# Patient Record
Sex: Female | Born: 1955 | Race: White | Hispanic: No | Smoking: Never smoker
Health system: Southern US, Community
[De-identification: ages and names within clinical notes are randomized; demographics above are authoritative.]

## PROBLEM LIST (undated history)

## (undated) DIAGNOSIS — I509 Heart failure, unspecified: Secondary | ICD-10-CM

## (undated) DIAGNOSIS — R011 Cardiac murmur, unspecified: Secondary | ICD-10-CM

## (undated) DIAGNOSIS — E782 Mixed hyperlipidemia: Secondary | ICD-10-CM

## (undated) DIAGNOSIS — E669 Obesity, unspecified: Secondary | ICD-10-CM

## (undated) DIAGNOSIS — I10 Essential (primary) hypertension: Secondary | ICD-10-CM

## (undated) HISTORY — PX: COLONOSCOPY: SHX174

## (undated) HISTORY — PX: DENTAL SURGERY: SHX609

---

## 2016-05-29 DIAGNOSIS — I10 Essential (primary) hypertension: Secondary | ICD-10-CM | POA: Insufficient documentation

## 2016-05-29 DIAGNOSIS — E782 Mixed hyperlipidemia: Secondary | ICD-10-CM | POA: Insufficient documentation

## 2016-05-29 DIAGNOSIS — E66812 Obesity, class 2: Secondary | ICD-10-CM | POA: Insufficient documentation

## 2016-05-29 DIAGNOSIS — Z6837 Body mass index (BMI) 37.0-37.9, adult: Secondary | ICD-10-CM

## 2016-05-29 HISTORY — DX: Morbid (severe) obesity due to excess calories: E66.01

## 2016-05-29 HISTORY — DX: Essential (primary) hypertension: I10

## 2016-05-29 HISTORY — DX: Obesity, class 2: E66.812

## 2016-05-29 HISTORY — DX: Body mass index (BMI) 37.0-37.9, adult: Z68.37

## 2017-06-12 DIAGNOSIS — Z0001 Encounter for general adult medical examination with abnormal findings: Secondary | ICD-10-CM

## 2017-06-12 HISTORY — DX: Encounter for general adult medical examination with abnormal findings: Z00.01

## 2019-03-01 DIAGNOSIS — N289 Disorder of kidney and ureter, unspecified: Secondary | ICD-10-CM

## 2019-03-01 HISTORY — DX: Disorder of kidney and ureter, unspecified: N28.9

## 2019-08-16 DIAGNOSIS — Z01419 Encounter for gynecological examination (general) (routine) without abnormal findings: Secondary | ICD-10-CM | POA: Insufficient documentation

## 2019-08-16 HISTORY — DX: Encounter for gynecological examination (general) (routine) without abnormal findings: Z01.419

## 2020-01-02 ENCOUNTER — Encounter (HOSPITAL_BASED_OUTPATIENT_CLINIC_OR_DEPARTMENT_OTHER): Payer: Self-pay | Admitting: Emergency Medicine

## 2020-01-02 ENCOUNTER — Emergency Department (HOSPITAL_BASED_OUTPATIENT_CLINIC_OR_DEPARTMENT_OTHER): Payer: BC Managed Care – PPO

## 2020-01-02 ENCOUNTER — Emergency Department (HOSPITAL_BASED_OUTPATIENT_CLINIC_OR_DEPARTMENT_OTHER)
Admission: EM | Admit: 2020-01-02 | Discharge: 2020-01-02 | Disposition: A | Discharge status: Home / Self Care | Payer: BC Managed Care – PPO | Attending: Emergency Medicine | Admitting: Emergency Medicine

## 2020-01-02 ENCOUNTER — Other Ambulatory Visit: Payer: Self-pay

## 2020-01-02 DIAGNOSIS — M79661 Pain in right lower leg: Secondary | ICD-10-CM

## 2020-01-02 DIAGNOSIS — Z880 Allergy status to penicillin: Secondary | ICD-10-CM | POA: Insufficient documentation

## 2020-01-02 DIAGNOSIS — Z888 Allergy status to other drugs, medicaments and biological substances status: Secondary | ICD-10-CM | POA: Diagnosis not present

## 2020-01-02 DIAGNOSIS — R2241 Localized swelling, mass and lump, right lower limb: Secondary | ICD-10-CM | POA: Diagnosis not present

## 2020-01-02 DIAGNOSIS — M79604 Pain in right leg: Secondary | ICD-10-CM | POA: Insufficient documentation

## 2020-01-02 DIAGNOSIS — M7989 Other specified soft tissue disorders: Secondary | ICD-10-CM

## 2020-01-02 HISTORY — DX: Cardiac murmur, unspecified: R01.1

## 2020-01-02 NOTE — ED Provider Notes (Signed)
[facility] EMERGENCY DEPARTMENT Provider Note   CSN: [id number] Arrival date & time: 01/02/20  1720     History Chief Complaint  Patient presents with  . Foot Pain    Latoya Rios is a 64 y.o. female.  Latoya Rios is a 64 y.o. female with a history of heart murmur, and hypertension, who presents to the emergency department for evaluation of right lower extremity swelling and pain.  Patient states symptoms have been present for 2 weeks.  He describes pain as a constant throbbing ache.  Pain seems to be worse in the ankle and radiating up.  She has had pain and swelling extending from the right foot and ankle up to the right calf.  Swelling and pain does not extend into the knee joint or thigh.  She denies any trauma or injury to the area.  States in the past she has had some mild swelling present in both legs that usually improves with elevation.  She does state that when she is elevating her leg, or not moving around more swelling seems to improve some and this does help with her discomfort.  She has noticed that it occasionally feels warm but she has not noted any redness, rash, wounds or skin changes.  She has not had any fevers or chills.  No history of gout.  No history of DVT or blood clots, but patient's daughter who is at bedside, states that she was diagnosed with factor V Leiden, and does not know much of her parents she inherited this from. Pt denies any chest pain or shortness of breath, no other joint pain or swelling.        Past Medical History:  Diagnosis Date  . Heart murmur     There are no problems to display for this patient.   History reviewed. No pertinent surgical history.   OB History   No obstetric history on file.     History reviewed. No pertinent family history.  Social History   Tobacco Use  . Smoking status: Never Smoker  Substance Use Topics  . Alcohol use: Never  . Drug use: Never    Home Medications Prior to Admission  medications   Not on File    Allergies    Clonidine, Nisoldipine, Penicillins, Quinacrine, and Quinapril  Review of Systems   Review of Systems  Constitutional: Negative for chills and fever.  Respiratory: Negative for cough and shortness of breath.   Cardiovascular: Positive for leg swelling. Negative for chest pain.  Gastrointestinal: Negative for abdominal pain, nausea and vomiting.  Musculoskeletal: Positive for arthralgias and joint swelling.  Skin: Negative for color change, rash and wound.  Neurological: Negative for weakness and numbness.  All other systems reviewed and are negative.   Physical Exam Updated Vital Signs BP (!) 212/82   Pulse 100   Temp 98.2 F (36.8 C) (Oral)   Resp 20   Ht 5\' 1"  (1.549 m)   Wt 93.4 kg   BMI 38.92 kg/m   Physical Exam Vitals and nursing note reviewed.  Constitutional:      General: She is not in acute distress.    Appearance: Normal appearance. She is well-developed. She is not ill-appearing or diaphoretic.     Comments: Well-appearing and in no distress.  HENT:     Head: Normocephalic and atraumatic.  Eyes:     General:        Right eye: No discharge.        Left eye:  No discharge.  Cardiovascular:     Rate and Rhythm: Normal rate and regular rhythm.     Pulses: Normal pulses.          Radial pulses are 2+ on the right side and 2+ on the left side.       Dorsalis pedis pulses are 2+ on the right side and 2+ on the left side.       Posterior tibial pulses are 2+ on the right side and 2+ on the left side.     Heart sounds: Normal heart sounds.  Pulmonary:     Effort: Pulmonary effort is normal. No respiratory distress.  Musculoskeletal:     Cervical back: Neck supple.     Comments: Right lower extremity with swelling extending from the foot up to the ankle and calf, edema is nonpitting, there is some warmth but no erythema, rash, wounds or skin changes noted.  Edema is not well localized over one joint.  Patient with 2+ DP  and TP pulses, equal compared to the left, trace swelling if any noted in the left lower extremity.  Tenderness to palpation over the ankle and calf without palpable deformity.  Patient is able to range the ankle with some discomfort, and has been able to bear weight.  Normal range of motion at the knee and hip.  Sensation and strength intact.  Skin:    General: Skin is warm and dry.     Findings: No rash.  Neurological:     Mental Status: She is alert and oriented to person, place, and time.     Coordination: Coordination normal.  Psychiatric:        Mood and Affect: Mood normal.        Behavior: Behavior normal.     ED Results / Procedures / Treatments   Labs (all labs ordered are listed, but only abnormal results are displayed) Labs Reviewed - No data to display  EKG None  Radiology DG Ankle Complete Right  Result Date: 01/02/2020 CLINICAL DATA:  Right foot and ankle pain and swelling for the past 2 weeks. No known injury. EXAM: RIGHT ANKLE - COMPLETE 3+ VIEW COMPARISON:  None. FINDINGS: Diffuse soft tissue swelling. No soft tissue gas, bone destruction or periosteal reaction. No fracture or effusion seen. Large inferior and small posterior calcaneal enthesophytes. IMPRESSION: Diffuse soft tissue swelling without underlying bony abnormality. Electronically Signed   By: Claudie Revering M.D.   On: 01/02/2020 18:59   US Venous Img Lower Right (DVT Study)  Result Date: 01/02/2020 CLINICAL DATA:  Right foot and ankle pain and swelling for the past 2 weeks. EXAM: RIGHT LOWER EXTREMITY VENOUS DOPPLER ULTRASOUND TECHNIQUE: Gray-scale sonography with compression, as well as color and duplex ultrasound, were performed to evaluate the deep venous system(s) from the level of the common femoral vein through the popliteal and proximal calf veins. COMPARISON:  None. FINDINGS: VENOUS Normal compressibility of the common femoral, superficial femoral, and popliteal veins, as well as the visualized calf  veins. Visualized portions of profunda femoral vein and great saphenous vein unremarkable. No filling defects to suggest DVT on grayscale or color Doppler imaging. Doppler waveforms show normal direction of venous flow, normal respiratory phasicity and response to augmentation. Limited views of the contralateral common femoral vein are unremarkable. OTHER None. Limitations: none IMPRESSION: No femoropopliteal DVT nor evidence of DVT within the visualized calf veins. If clinical symptoms are inconsistent or if there are persistent or worsening symptoms, further imaging (possibly involving the  iliac veins) may be warranted. Electronically Signed   By: Beckie Salts M.D.   On: 01/02/2020 19:00     Procedures Procedures (including critical care time)  Medications Ordered in ED Medications - No data to display  ED Course  I have reviewed the triage vital signs and the nursing notes.  Pertinent labs & imaging results that were available during my care of the patient were reviewed by me and considered in my medical decision making (see chart for details).    MDM Rules/Calculators/A&P                     64 year old female presents with right lower extremity pain and swelling.  No trauma or inciting injury.  On exam she has swelling through the foot and ankle, up to the top of the calf.  Swelling does not localize over one joint and she has range of motion of the joints.  I have low suspicion for septic arthritis or gout.  Daughter with history of factor V Leiden but patient without history of blood clots, will get DVT study to rule out, patient without any chest pain or shortness of breath.  Also get x-ray of the ankle as this seems to be where patient's pain is most severe.  She does not have bilateral swelling or other signs of fluid overload.  Patient is noted to be hypertensive, states that she stopped taking her blood pressure medication in December, because she did not like taking the pills, has been  trying to manage her blood pressure with diet modification.  I have ordered, reviewed and interpreted imaging reports.  X-ray with soft tissue swelling without underlying bony abnormality, negative DVT study.  Patient may have venous insufficiency.  We will have her begin wearing compression stockings, encourage frequent elevation, and close PCP follow-up.  Stressed the importance of blood pressure management as well, but patient wants to discuss this with her PCP.  Discussed appropriate return precautions.  Patient and daughter expressed understanding and agreement with plan.  Discharged home in good condition.  Final Clinical Impression(s) / ED Diagnoses Final diagnoses:  Pain and swelling of right lower leg    Rx / DC Orders ED Discharge Orders    None       Dartha Lodge, [place] 01/06/20 1432    Milagros Loll, MD 01/06/20 [phone]

## 2020-01-02 NOTE — Discharge Instructions (Signed)
Your work-up today is reassuring and did not show a blood clot or evidence of a fracture.  Please elevate leg as much as possible and wear compression stockings.  Follow-up closely with primary care, you can contact the primary care office here in this building, or use the phone number on your paperwork today to establish care with a PCP through the [facility] system.

## 2020-01-02 NOTE — ED Notes (Signed)
Taken to xray.

## 2020-01-02 NOTE — ED Triage Notes (Signed)
Pt c/o right lower leg and foot pain April 4th. Pt denies injury. Swelling noted.

## 2020-02-29 DIAGNOSIS — Z5320 Procedure and treatment not carried out because of patient's decision for unspecified reasons: Secondary | ICD-10-CM

## 2020-02-29 HISTORY — DX: Procedure and treatment not carried out because of patient's decision for unspecified reasons: Z53.20

## 2020-06-08 ENCOUNTER — Encounter (HOSPITAL_BASED_OUTPATIENT_CLINIC_OR_DEPARTMENT_OTHER): Payer: Self-pay | Admitting: Emergency Medicine

## 2020-06-08 ENCOUNTER — Emergency Department (HOSPITAL_BASED_OUTPATIENT_CLINIC_OR_DEPARTMENT_OTHER): Payer: BC Managed Care – PPO

## 2020-06-08 ENCOUNTER — Inpatient Hospital Stay (HOSPITAL_BASED_OUTPATIENT_CLINIC_OR_DEPARTMENT_OTHER)
Admission: EM | Admit: 2020-06-08 | Discharge: 2020-06-12 | DRG: 177 | Disposition: A | Discharge status: Home / Self Care | Payer: BC Managed Care – PPO | Attending: Internal Medicine | Admitting: Internal Medicine

## 2020-06-08 ENCOUNTER — Other Ambulatory Visit: Payer: Self-pay

## 2020-06-08 DIAGNOSIS — Z6838 Body mass index (BMI) 38.0-38.9, adult: Secondary | ICD-10-CM

## 2020-06-08 DIAGNOSIS — I2489 Other forms of acute ischemic heart disease: Secondary | ICD-10-CM

## 2020-06-08 DIAGNOSIS — I447 Left bundle-branch block, unspecified: Secondary | ICD-10-CM | POA: Diagnosis present

## 2020-06-08 DIAGNOSIS — U071 COVID-19: Principal | ICD-10-CM

## 2020-06-08 DIAGNOSIS — I160 Hypertensive urgency: Secondary | ICD-10-CM

## 2020-06-08 DIAGNOSIS — I340 Nonrheumatic mitral (valve) insufficiency: Secondary | ICD-10-CM | POA: Diagnosis not present

## 2020-06-08 DIAGNOSIS — I10 Essential (primary) hypertension: Secondary | ICD-10-CM

## 2020-06-08 DIAGNOSIS — I248 Other forms of acute ischemic heart disease: Secondary | ICD-10-CM

## 2020-06-08 DIAGNOSIS — I509 Heart failure, unspecified: Secondary | ICD-10-CM

## 2020-06-08 DIAGNOSIS — E669 Obesity, unspecified: Secondary | ICD-10-CM | POA: Diagnosis present

## 2020-06-08 DIAGNOSIS — J9601 Acute respiratory failure with hypoxia: Secondary | ICD-10-CM

## 2020-06-08 DIAGNOSIS — I110 Hypertensive heart disease with heart failure: Secondary | ICD-10-CM | POA: Diagnosis present

## 2020-06-08 DIAGNOSIS — R7989 Other specified abnormal findings of blood chemistry: Secondary | ICD-10-CM

## 2020-06-08 DIAGNOSIS — Z79899 Other long term (current) drug therapy: Secondary | ICD-10-CM

## 2020-06-08 DIAGNOSIS — J1282 Pneumonia due to coronavirus disease 2019: Secondary | ICD-10-CM | POA: Diagnosis present

## 2020-06-08 DIAGNOSIS — E782 Mixed hyperlipidemia: Secondary | ICD-10-CM | POA: Diagnosis present

## 2020-06-08 DIAGNOSIS — I5033 Acute on chronic diastolic (congestive) heart failure: Secondary | ICD-10-CM | POA: Diagnosis present

## 2020-06-08 DIAGNOSIS — R778 Other specified abnormalities of plasma proteins: Secondary | ICD-10-CM

## 2020-06-08 HISTORY — DX: Other forms of acute ischemic heart disease: I24.8

## 2020-06-08 HISTORY — DX: Heart failure, unspecified: I50.9

## 2020-06-08 HISTORY — DX: COVID-19: U07.1

## 2020-06-08 HISTORY — DX: Essential (primary) hypertension: I10

## 2020-06-08 HISTORY — DX: Obesity, unspecified: E66.9

## 2020-06-08 HISTORY — DX: Pneumonia due to coronavirus disease 2019: J12.82

## 2020-06-08 HISTORY — DX: Other forms of acute ischemic heart disease: I24.89

## 2020-06-08 HISTORY — DX: Mixed hyperlipidemia: E78.2

## 2020-06-08 HISTORY — DX: Acute respiratory failure with hypoxia: J96.01

## 2020-06-08 LAB — COMPREHENSIVE METABOLIC PANEL WITH GFR
ALT: 30 U/L (ref 0–44)
AST: 26 U/L (ref 15–41)
Albumin: 3.6 g/dL (ref 3.5–5.0)
Alkaline Phosphatase: 95 U/L (ref 38–126)
Anion gap: 12 (ref 5–15)
BUN: 17 mg/dL (ref 8–23)
CO2: 25 mmol/L (ref 22–32)
Calcium: 8.9 mg/dL (ref 8.9–10.3)
Chloride: 101 mmol/L (ref 98–111)
Creatinine, Ser: 0.71 mg/dL (ref 0.44–1.00)
GFR calc Af Amer: >60 mL/min
GFR calc non Af Amer: >60 mL/min
Glucose, Bld: 109 mg/dL — ABNORMAL HIGH (ref 70–99)
Potassium: 3.6 mmol/L (ref 3.5–5.1)
Sodium: 138 mmol/L (ref 135–145)
Total Bilirubin: 0.5 mg/dL (ref 0.3–1.2)
Total Protein: 7.5 g/dL (ref 6.5–8.1)

## 2020-06-08 LAB — FIBRINOGEN: Fibrinogen: 523 mg/dL — ABNORMAL HIGH (ref 210–475)

## 2020-06-08 LAB — TROPONIN I (HIGH SENSITIVITY)
Troponin I (High Sensitivity): 129 ng/L (ref <18)
Troponin I (High Sensitivity): 194 ng/L
Troponin I (High Sensitivity): 95 ng/L — ABNORMAL HIGH (ref <18)
Troponin I (High Sensitivity): 84 ng/L — ABNORMAL HIGH (ref <18)

## 2020-06-08 LAB — HIV ANTIBODY (ROUTINE TESTING W REFLEX): HIV Screen 4th Generation wRfx: NONREACTIVE

## 2020-06-08 LAB — LACTATE DEHYDROGENASE: LDH: 239 U/L — ABNORMAL HIGH (ref 98–192)

## 2020-06-08 LAB — CBC WITH DIFFERENTIAL/PLATELET
Abs Immature Granulocytes: 0.07 10*3/uL (ref 0.00–0.07)
Basophils Absolute: 0 10*3/uL (ref 0.0–0.1)
Basophils Relative: 0 %
Eosinophils Absolute: 0.1 10*3/uL (ref 0.0–0.5)
Eosinophils Relative: 1 %
HCT: 44.2 % (ref 36.0–46.0)
Hemoglobin: 14.5 g/dL (ref 12.0–15.0)
Immature Granulocytes: 1 %
Lymphocytes Relative: 7 %
Lymphs Abs: 0.8 10*3/uL (ref 0.7–4.0)
MCH: 28.6 pg (ref 26.0–34.0)
MCHC: 32.8 g/dL (ref 30.0–36.0)
MCV: 87.2 fL (ref 80.0–100.0)
Monocytes Absolute: 1.2 10*3/uL — ABNORMAL HIGH (ref 0.1–1.0)
Monocytes Relative: 10 %
Neutro Abs: 9.3 10*3/uL — ABNORMAL HIGH (ref 1.7–7.7)
Neutrophils Relative %: 81 %
Platelets: 300 10*3/uL (ref 150–400)
RBC: 5.07 MIL/uL (ref 3.87–5.11)
RDW: 13 % (ref 11.5–15.5)
WBC: 11.4 10*3/uL — ABNORMAL HIGH (ref 4.0–10.5)
nRBC: 0 % (ref 0.0–0.2)

## 2020-06-08 LAB — LACTIC ACID, PLASMA: Lactic Acid, Venous: 1.3 mmol/L (ref 0.5–1.9)

## 2020-06-08 LAB — C-REACTIVE PROTEIN: CRP: 2 mg/dL — ABNORMAL HIGH

## 2020-06-08 LAB — TRIGLYCERIDES: Triglycerides: 84 mg/dL (ref <150)

## 2020-06-08 LAB — SARS CORONAVIRUS 2 BY RT PCR (HOSPITAL ORDER, PERFORMED IN ~~LOC~~ HOSPITAL LAB): SARS Coronavirus 2: POSITIVE — AB

## 2020-06-08 LAB — PROCALCITONIN: Procalcitonin: <0.1 ng/mL

## 2020-06-08 LAB — HEPARIN LEVEL (UNFRACTIONATED)
Heparin Unfractionated: 0.33 [IU]/mL (ref 0.30–0.70)
Heparin Unfractionated: 0.16 IU/mL — ABNORMAL LOW (ref 0.30–0.70)

## 2020-06-08 LAB — BRAIN NATRIURETIC PEPTIDE: B Natriuretic Peptide: 297.3 pg/mL — ABNORMAL HIGH (ref 0.0–100.0)

## 2020-06-08 LAB — D-DIMER, QUANTITATIVE: D-Dimer, Quant: 1.31 ug/mL-FEU — ABNORMAL HIGH (ref 0.00–0.50)

## 2020-06-08 LAB — FERRITIN: Ferritin: 238 ng/mL (ref 11–307)

## 2020-06-08 MED ORDER — HYDROCOD POLST-CPM POLST ER 10-8 MG/5ML PO SUER
5.0000 mL | Freq: Two times a day (BID) | ORAL | Status: DC | PRN
  Filled 2020-06-08: qty 5

## 2020-06-08 MED ORDER — HEPARIN BOLUS VIA INFUSION
4000.0000 [IU] | Freq: Once | INTRAVENOUS | Status: AC
  Administered 2020-06-08: 4000 [IU] via INTRAVENOUS

## 2020-06-08 MED ORDER — HYDRALAZINE HCL 20 MG/ML IJ SOLN: 5.0000 mg | Freq: Once | INTRAMUSCULAR | Status: DC

## 2020-06-08 MED ORDER — HYDROCHLOROTHIAZIDE 25 MG PO TABS
ORAL_TABLET | ORAL | Status: AC
  Administered 2020-06-08: 25 mg
  Filled 2020-06-08: qty 1

## 2020-06-08 MED ORDER — HEPARIN (PORCINE) 25000 UT/250ML-% IV SOLN
1100.0000 [IU]/h | INTRAVENOUS | Status: DC
  Administered 2020-06-08 – 2020-06-10 (×2): 1100 [IU]/h via INTRAVENOUS
  Filled 2020-06-08 (×2): qty 250

## 2020-06-08 MED ORDER — DEXAMETHASONE 4 MG PO TABS: 6.0000 mg | ORAL_TABLET | ORAL | Status: DC

## 2020-06-08 MED ORDER — SODIUM CHLORIDE 0.9 % IV SOLN: 200.0000 mg | Freq: Once | INTRAVENOUS | Status: DC

## 2020-06-08 MED ORDER — DEXAMETHASONE 4 MG PO TABS
6.0000 mg | ORAL_TABLET | ORAL | Status: DC
  Administered 2020-06-08: 6 mg via ORAL
  Filled 2020-06-08: qty 2

## 2020-06-08 MED ORDER — LOSARTAN POTASSIUM 50 MG PO TABS
100.0000 mg | ORAL_TABLET | Freq: Every day | ORAL | Status: DC
  Administered 2020-06-09 – 2020-06-12 (×4): 100 mg via ORAL
  Filled 2020-06-08 (×4): qty 2

## 2020-06-08 MED ORDER — DEXAMETHASONE SODIUM PHOSPHATE 10 MG/ML IJ SOLN
6.0000 mg | Freq: Once | INTRAMUSCULAR | Status: AC
  Administered 2020-06-08: 6 mg via INTRAVENOUS
  Filled 2020-06-08: qty 1

## 2020-06-08 MED ORDER — ASPIRIN EC 81 MG PO TBEC
81.0000 mg | DELAYED_RELEASE_TABLET | Freq: Every day | ORAL | Status: DC
  Administered 2020-06-09 – 2020-06-12 (×4): 81 mg via ORAL
  Filled 2020-06-08 (×4): qty 1

## 2020-06-08 MED ORDER — SODIUM CHLORIDE 0.9 % IV SOLN
100.0000 mg | INTRAVENOUS | Status: AC
  Administered 2020-06-08 (×2): 100 mg via INTRAVENOUS
  Filled 2020-06-08 (×2): qty 20

## 2020-06-08 MED ORDER — HYDROCHLOROTHIAZIDE 25 MG PO TABS: 25.0000 mg | ORAL_TABLET | Freq: Every day | ORAL | Status: DC

## 2020-06-08 MED ORDER — ASCORBIC ACID 500 MG PO TABS
500.0000 mg | ORAL_TABLET | Freq: Every day | ORAL | Status: DC
  Administered 2020-06-09 – 2020-06-12 (×4): 500 mg via ORAL
  Filled 2020-06-08 (×4): qty 1

## 2020-06-08 MED ORDER — CARVEDILOL 3.125 MG PO TABS
3.1250 mg | ORAL_TABLET | Freq: Two times a day (BID) | ORAL | Status: DC
  Administered 2020-06-08 (×2): 3.125 mg via ORAL
  Filled 2020-06-08: qty 1

## 2020-06-08 MED ORDER — ZINC SULFATE 220 (50 ZN) MG PO CAPS
220.0000 mg | ORAL_CAPSULE | Freq: Every day | ORAL | Status: DC
  Administered 2020-06-09 – 2020-06-12 (×4): 220 mg via ORAL
  Filled 2020-06-08 (×4): qty 1

## 2020-06-08 MED ORDER — HYDRALAZINE HCL 20 MG/ML IJ SOLN: 2.0000 mg | Freq: Once | INTRAMUSCULAR | Status: DC

## 2020-06-08 MED ORDER — SODIUM CHLORIDE 0.9% FLUSH
3.0000 mL | Freq: Two times a day (BID) | INTRAVENOUS | Status: DC
  Administered 2020-06-09 – 2020-06-12 (×6): 3 mL via INTRAVENOUS

## 2020-06-08 MED ORDER — ACETAMINOPHEN 500 MG PO TABS
1000.0000 mg | ORAL_TABLET | Freq: Once | ORAL | Status: AC
  Administered 2020-06-08: 1000 mg via ORAL
  Filled 2020-06-08: qty 2

## 2020-06-08 MED ORDER — FUROSEMIDE 10 MG/ML IJ SOLN
40.0000 mg | Freq: Once | INTRAMUSCULAR | Status: AC
  Administered 2020-06-08: 40 mg via INTRAVENOUS
  Filled 2020-06-08: qty 4

## 2020-06-08 MED ORDER — HEPARIN BOLUS VIA INFUSION
2000.0000 [IU] | Freq: Once | INTRAVENOUS | Status: AC
  Administered 2020-06-08: 2000 [IU] via INTRAVENOUS
  Filled 2020-06-08: qty 2000

## 2020-06-08 MED ORDER — ONDANSETRON HCL 4 MG/2ML IJ SOLN
4.0000 mg | Freq: Once | INTRAMUSCULAR | Status: AC
  Administered 2020-06-08: 4 mg via INTRAVENOUS
  Filled 2020-06-08: qty 2

## 2020-06-08 MED ORDER — LOSARTAN POTASSIUM-HCTZ 100-25 MG PO TABS
1.0000 | ORAL_TABLET | Freq: Every day | ORAL | Status: DC
  Administered 2020-06-08: 1 via ORAL

## 2020-06-08 MED ORDER — LOSARTAN POTASSIUM 25 MG PO TABS
ORAL_TABLET | ORAL | Status: AC
  Administered 2020-06-08: 100 mg
  Filled 2020-06-08: qty 4

## 2020-06-08 MED ORDER — CARVEDILOL 6.25 MG PO TABS
ORAL_TABLET | ORAL | Status: AC
  Filled 2020-06-08: qty 1

## 2020-06-08 MED ORDER — SODIUM CHLORIDE 0.9 % IV SOLN: 100.0000 mg | Freq: Every day | INTRAVENOUS | Status: DC

## 2020-06-08 MED ORDER — ONDANSETRON HCL 4 MG PO TABS: 4.0000 mg | ORAL_TABLET | Freq: Four times a day (QID) | ORAL | Status: DC | PRN

## 2020-06-08 MED ORDER — ACETAMINOPHEN 325 MG PO TABS
650.0000 mg | ORAL_TABLET | Freq: Four times a day (QID) | ORAL | Status: DC | PRN
  Filled 2020-06-08: qty 2

## 2020-06-08 MED ORDER — IPRATROPIUM-ALBUTEROL 20-100 MCG/ACT IN AERS
1.0000 | INHALATION_SPRAY | Freq: Four times a day (QID) | RESPIRATORY_TRACT | Status: DC
  Administered 2020-06-08 – 2020-06-11 (×8): 1 via RESPIRATORY_TRACT
  Filled 2020-06-08: qty 4

## 2020-06-08 MED ORDER — SENNOSIDES-DOCUSATE SODIUM 8.6-50 MG PO TABS
1.0000 | ORAL_TABLET | Freq: Every evening | ORAL | Status: DC | PRN
  Administered 2020-06-09: 1 via ORAL
  Filled 2020-06-08: qty 1

## 2020-06-08 MED ORDER — HYDRALAZINE HCL 20 MG/ML IJ SOLN
INTRAMUSCULAR | Status: AC
  Administered 2020-06-08: 5 mg via INTRAVENOUS
  Filled 2020-06-08: qty 1

## 2020-06-08 MED ORDER — CARVEDILOL 6.25 MG PO TABS
6.2500 mg | ORAL_TABLET | Freq: Two times a day (BID) | ORAL | Status: DC
  Administered 2020-06-09 – 2020-06-10 (×4): 6.25 mg via ORAL
  Filled 2020-06-08 (×5): qty 1

## 2020-06-08 MED ORDER — GUAIFENESIN-DM 100-10 MG/5ML PO SYRP
10.0000 mL | ORAL_SOLUTION | ORAL | Status: DC | PRN
  Filled 2020-06-08: qty 10

## 2020-06-08 MED ORDER — HEPARIN (PORCINE) 25000 UT/250ML-% IV SOLN
900.0000 [IU]/h | INTRAVENOUS | Status: DC
  Administered 2020-06-08: 900 [IU]/h via INTRAVENOUS
  Filled 2020-06-08 (×2): qty 250

## 2020-06-08 MED ORDER — SODIUM CHLORIDE 0.9 % IV SOLN
100.0000 mg | Freq: Every day | INTRAVENOUS | Status: AC
  Administered 2020-06-09 – 2020-06-12 (×4): 100 mg via INTRAVENOUS
  Filled 2020-06-08 (×4): qty 20

## 2020-06-08 MED ORDER — ONDANSETRON HCL 4 MG/2ML IJ SOLN: 4.0000 mg | Freq: Four times a day (QID) | INTRAMUSCULAR | Status: DC | PRN

## 2020-06-08 NOTE — ED Notes (Signed)
Given [employer] for lunch

## 2020-06-08 NOTE — Progress Notes (Signed)
Placed pt on cont pulse ox, provided Incentive spiro & flutter. Educated on the use of both & return demonstration noted.

## 2020-06-08 NOTE — Progress Notes (Signed)
ANTICOAGULATION CONSULT NOTE -  Consult  Pharmacy Consult for IV heparin  Indication: Elevated troponin   Allergies  Allergen Reactions  . Clonidine   . Nisoldipine   . Penicillins   . Quinacrine   . Quinapril     Patient Measurements: Height: 5\' 1"  (154.9 cm) Weight: 93.4 kg (205 lb 14.6 oz) IBW/kg (Calculated) : 47.8  Vital Signs: Temp: 98.9 F (37.2 C) (09/23 2148) BP: 177/59 (09/23 2148) Pulse Rate: 68 (09/23 2148)  Labs: Recent Labs    06/08/20 0225 06/08/20 0225 06/08/20 0432 06/08/20 1208 06/08/20 1913 06/08/20 2019  HGB 14.5  --   --   --   --   --   HCT 44.2  --   --   --   --   --   PLT 300  --   --   --   --   --   HEPARINUNFRC  --   --   --  0.33 0.16*  --   CREATININE 0.71  --   --   --   --   --   TROPONINIHS 129*   < > 194*  --  95* 84*   < > = values in this interval not displayed.    Estimated Creatinine Clearance: 74 mL/min (by C-G formula based on SCr of 0.71 mg/dL).   Medical History: Past Medical History:  Diagnosis Date  . CHF (congestive heart failure) (HCC)   . Heart murmur   . Hypertension   . Mixed hyperlipidemia   . Obese     Assessment: 64 y/o F with shortness of breath and mildly elevated troponin. Starting heparin for now. CBC/renal function good. PTA meds reviewed, not on AC PTA.  Heparin level = 0.16, subtherapeutic. No line or  bleeding noted per RN .   Goal of Therapy:  Heparin level 0.3-0.7 units/ml Monitor platelets by anticoagulation protocol: Yes   Plan:   Heparin 2000 unit bolus  Increase heparin drip to 1100 units/hr  Repeat 6 hour HL, if therapeutic then check daily  Daily CBC  Monitor for bleeding   77, PharmD, BCPS 06/08/2020 11:14 PM

## 2020-06-08 NOTE — Progress Notes (Signed)
ANTICOAGULATION CONSULT NOTE - Initial Consult  Pharmacy Consult for Heparin  Indication: Elevated troponin   Allergies  Allergen Reactions  . Clonidine   . Nisoldipine   . Penicillins   . Quinacrine   . Quinapril     Patient Measurements: Height: 5\' 1"  (154.9 cm) Weight: 93.4 kg (205 lb 14.6 oz) IBW/kg (Calculated) : 47.8  Vital Signs: Temp: 99.6 F (37.6 C) (09/23 0431) Temp Source: Oral (09/23 0431) BP: 170/73 (09/23 0430) Pulse Rate: 84 (09/23 0431)  Labs: Recent Labs    06/08/20 0225  HGB 14.5  HCT 44.2  PLT 300  CREATININE 0.71  TROPONINIHS 129*    Estimated Creatinine Clearance: 74 mL/min (by C-G formula based on SCr of 0.71 mg/dL).   Medical History: Past Medical History:  Diagnosis Date  . Heart murmur   . Hypertension   . Mixed hyperlipidemia   . Obese     Assessment: 64 y/o F with shortness of breath and mildly elevated troponin. Starting heparin for now. CBC/renal function good. PTA meds reviewed.   Goal of Therapy:  Heparin level 0.3-0.7 units/ml Monitor platelets by anticoagulation protocol: Yes   Plan:  Heparin 4000 units BOLUS Start heparin drip at 900 units/hr 1200 HL Daily CBC/HL Monitor for bleeding  77, PharmD, BCPS Clinical Pharmacist Phone: [phone]

## 2020-06-08 NOTE — ED Notes (Signed)
Report given to Nicole RN with [facility] 

## 2020-06-08 NOTE — ED Notes (Signed)
Report given to receiving nurse Annabelle Harman RN at [facility]

## 2020-06-08 NOTE — ED Notes (Signed)
ED MD informed of BP, 205/103

## 2020-06-08 NOTE — ED Notes (Signed)
RT to bedside, assessed pt and placed on oxygen 2 L via [place].

## 2020-06-08 NOTE — Progress Notes (Signed)
ANTICOAGULATION CONSULT NOTE - Initial Consult  Pharmacy Consult for IV heparin  Indication: Elevated troponin   Allergies  Allergen Reactions  . Clonidine   . Nisoldipine   . Penicillins   . Quinacrine   . Quinapril     Patient Measurements: Height: 5\' 1"  (154.9 cm) Weight: 93.4 kg (205 lb 14.6 oz) IBW/kg (Calculated) : 47.8  Vital Signs: Temp: 98.1 F (36.7 C) (09/23 0630) Temp Source: Oral (09/23 0630) BP: 202/98 (09/23 1230) Pulse Rate: 80 (09/23 1230)  Labs: Recent Labs    06/08/20 0225 06/08/20 0432  HGB 14.5  --   HCT 44.2  --   PLT 300  --   CREATININE 0.71  --   TROPONINIHS 129* 194*    Estimated Creatinine Clearance: 74 mL/min (by C-G formula based on SCr of 0.71 mg/dL).   Medical History: Past Medical History:  Diagnosis Date  . CHF (congestive heart failure) (HCC)   . Heart murmur   . Hypertension   . Mixed hyperlipidemia   . Obese     Assessment: 64 y/o F with shortness of breath and mildly elevated troponin. Starting heparin for now. CBC/renal function good. PTA meds reviewed, not on AC PTA.  6 hour heparin level = 0.33. No s/sx bleeding charted.   Goal of Therapy:  Heparin level 0.3-0.7 units/ml Monitor platelets by anticoagulation protocol: Yes   Plan:  Continue heparin drip at 900 units/hr Repeat 6 hour HL, if therapeutic then check daily Daily CBC Monitor for bleeding  77, PharmD PGY1 Acute Care Pharmacy Resident Please refer to [facility] for unit-specific pharmacist

## 2020-06-08 NOTE — ED Notes (Signed)
EKG Done and given to Dr Tanna Savoy

## 2020-06-08 NOTE — ED Triage Notes (Signed)
Pt reports starting with cough yesterday afternoon. Pt reports feeling shob tonight. Pt oxygen sat 90% after ambulating to room.

## 2020-06-08 NOTE — H&P (Signed)
Latoya Rios is an 64 y.o. female.   Chief Complaint: Shortness of breath, cough, low grade fever, and chest tightness. HPI: Latoya Rios is a 63 yr old woman who carries a past medical history of morbid obesity and hypertension. She asserts that she "weaned herself off" of coreg as of Christmas eve of last year through a 70 lb weight loss. She states that her symptoms began on 06/06/2020. She was transferred from [facility] to [facility] for admission due to respiratory symptoms and positive COVID-19.  At [facility] Latoya Rios was found to have very high blood pressures and hypoxia with SaO2 of 92% on room air with complaints of shortness of breath. Her SaO2 increased to Latoya upper nineties with 2 liters.  Lab results demonstrated Troponin of 129 which increased to 194, LDH of 239, CRP of 2.0, Ferritin of 238, Procalcitonin <0.10. CXR demonstrated mild congestion. EKG demonstrated a left bundle branch block without previous EKG for comparison.  Latoya Rios states that she had emesis x 2 at [facility], she denies fevers, chills, diarrhea, hematemesis, hematochezia, coffee ground emesis, and melena. She denies neurological changes, chest pain or pressure, although she does admit to chest tightness.  [facility] have been consulted to admit Latoya Rios for further evaluation and treatment.  Past Medical History:  Diagnosis Date  . CHF (congestive heart failure) (HCC)   . Heart murmur   . Hypertension   . Mixed hyperlipidemia   . Obese     History reviewed. No pertinent surgical history.  No family history on file. Social History:  reports that she has never smoked. She does not have any smokeless tobacco history on file. She reports that she does not drink alcohol and does not use drugs. Medications Prior to Admission  Medication Sig Dispense Refill  . carvedilol (COREG) 3.125 MG tablet Take 3.125 mg by mouth 2 (two) times daily with a meal.    . losartan-hydrochlorothiazide (HYZAAR) 100-25 MG tablet Take 1  tablet by mouth daily.      Allergies:  Allergies  Allergen Reactions  . Clonidine   . Nisoldipine   . Penicillins   . Quinacrine   . Quinapril     Pertinent items noted in HPI and remainder of comprehensive ROS otherwise negative.   General appearance: alert, cooperative and no distress Head: Normocephalic, without obvious abnormality, atraumatic Eyes: conjunctivae/corneas clear. PERRL, EOM's intact. Fundi benign. Throat: lips, mucosa, and tongue normal; teeth and gums normal Neck: no adenopathy, no carotid bruit, no JVD, supple, symmetrical, trachea midline and thyroid not enlarged, symmetric, no tenderness/mass/nodules Resp: No increased work of breathing. No wheezes, rales, or rhonchi. No tactile fremitus Chest wall: no tenderness Cardio: regular rate and rhythm, S1, S2 normal, no murmur, click, rub or gallop GI: soft, non-tender; bowel sounds normal; no masses,  no organomegaly Extremities: +2 pitting edema of lower extremities bilaterally. Pulses: 2+ and symmetric Skin: Skin color, texture, turgor normal. No rashes or lesions Lymph nodes: Cervical, supraclavicular, and axillary nodes normal. Neurologic: Alert and oriented X 3, normal strength and tone. Normal symmetric reflexes. Normal coordination and gait   Results for orders placed or performed during Latoya hospital encounter of 06/08/20 (from Latoya past 48 hour(s))  Blood Culture (routine x 2)     Status: None (Preliminary result)   Collection Time: 06/08/20  2:11 AM   Specimen: BLOOD RIGHT ARM  Result Value Ref Range   Specimen Description      BLOOD RIGHT ARM Performed at [facility],  [address], [place], [place] [address]    Special Requests      BOTTLES DRAWN AEROBIC AND ANAEROBIC Blood Culture adequate volume Performed at [facility], [address] [place], [place] [address]    Culture      NO GROWTH < 12 HOURS Performed at [facility], [address] [place], [place]  [address]    Report Status PENDING   Blood Culture (routine x 2)     Status: None (Preliminary result)   Collection Time: 06/08/20  2:16 AM   Specimen: BLOOD LEFT ARM  Result Value Ref Range   Specimen Description      BLOOD LEFT ARM Performed at [facility] [address] [place], [place] [address]    Special Requests      BOTTLES DRAWN AEROBIC AND ANAEROBIC Blood Culture adequate volume Performed at [facility], [address] [place], [place] [address]    Culture      NO GROWTH < 12 HOURS Performed at [facility], [address], [place], [place] [address]    Report Status PENDING   CBC with Differential/Platelet     Status: Abnormal   Collection Time: 06/08/20  2:25 AM  Result Value Ref Range   WBC 11.4 (H) 4.0 - 10.5 K/uL   RBC 5.07 3.87 - 5.11 MIL/uL   Hemoglobin 14.5 12.0 - 15.0 g/dL   HCT 20.2 36 - 46 %   MCV 87.2 80.0 - 100.0 fL   MCH 28.6 26.0 - 34.0 pg   MCHC 32.8 30.0 - 36.0 g/dL   RDW 54.2 70.6 - 23.7 %   Platelets 300 150 - 400 K/uL   nRBC 0.0 0.0 - 0.2 %   Neutrophils Relative % 81 %   Neutro Abs 9.3 (H) 1.7 - 7.7 K/uL   Lymphocytes Relative 7 %   Lymphs Abs 0.8 0.7 - 4.0 K/uL   Monocytes Relative 10 %   Monocytes Absolute 1.2 (H) 0 - 1 K/uL   Eosinophils Relative 1 %   Eosinophils Absolute 0.1 0 - 0 K/uL   Basophils Relative 0 %   Basophils Absolute 0.0 0 - 0 K/uL   Immature Granulocytes 1 %   Abs Immature Granulocytes 0.07 0.00 - 0.07 K/uL    Comment: Performed at [facility], [address] [place], [place] [address]  Troponin I (High Sensitivity)     Status: Abnormal   Collection Time: 06/08/20  2:25 AM  Result Value Ref Range   Troponin I (High Sensitivity) 129 (HH) <18 ng/L    Comment: CRITICAL RESULT CALLED TO, READ BACK BY AND VERIFIED WITH: POWELL,A AT 0316 ON [id number] BY CHERESNOWSKY,T (NOTE) Elevated high sensitivity troponin I (hsTnI) values and significant  changes across serial measurements may  suggest ACS but many other  chronic and acute conditions are known to elevate hsTnI results.  Refer to Latoya Links section for chest pain algorithms and additional  guidance. Performed at [facility], [address] [place], [place] [address]   Brain natriuretic peptide     Status: Abnormal   Collection Time: 06/08/20  2:25 AM  Result Value Ref Range   B Natriuretic Peptide 297.3 (H) 0.0 - 100.0 pg/mL    Comment: Performed at [facility], [address] [place], [place] [address]  Lactic acid, plasma     Status: None   Collection  Time: 06/08/20  2:25 AM  Result Value Ref Range   Lactic Acid, Venous 1.3 0.5 - 1.9 mmol/L    Comment: Performed at [facility], [address] [place], [place] [address]  Comprehensive metabolic panel     Status: Abnormal   Collection Time: 06/08/20  2:25 AM  Result Value Ref Range   Sodium 138 135 - 145 mmol/L   Potassium 3.6 3.5 - 5.1 mmol/L   Chloride 101 98 - 111 mmol/L   CO2 25 22 - 32 mmol/L   Glucose, Bld 109 (H) 70 - 99 mg/dL    Comment: Glucose reference range applies only to samples taken after fasting for at least 8 hours.   BUN 17 8 - 23 mg/dL   Creatinine, Ser 7.51 0.44 - 1.00 mg/dL   Calcium 8.9 8.9 - 70.0 mg/dL   Total Protein 7.5 6.5 - 8.1 g/dL   Albumin 3.6 3.5 - 5.0 g/dL   AST 26 15 - 41 U/L   ALT 30 0 - 44 U/L   Alkaline Phosphatase 95 38 - 126 U/L   Total Bilirubin 0.5 0.3 - 1.2 mg/dL   GFR calc non Af Amer >60 >60 mL/min   GFR calc Af Amer >60 >60 mL/min   Anion gap 12 5 - 15    Comment: Performed at [facility], [address] [place], [place] [address]  D-dimer, quantitative     Status: Abnormal   Collection Time: 06/08/20  2:25 AM  Result Value Ref Range   D-Dimer, Quant 1.31 (H) 0.00 - 0.50 ug/mL-FEU    Comment: (NOTE) At Latoya manufacturer cut-off of 0.50 ug/mL FEU, this assay has been documented to exclude PE with a sensitivity and negative predictive value of 97 to 99%.   At this time, this assay has not been approved by [place] FDA to exclude DVT/VTE. Results should be correlated with clinical presentation. Performed at [facility], [address] [place], [place] [address]   Procalcitonin     Status: None   Collection Time: 06/08/20  2:25 AM  Result Value Ref Range   Procalcitonin <0.10 ng/mL    Comment:        Interpretation: PCT (Procalcitonin) <= 0.5 ng/mL: Systemic infection (sepsis) is not likely. Local bacterial infection is possible. (NOTE)       Sepsis PCT Algorithm           Lower Respiratory Tract                                      Infection PCT Algorithm    ----------------------------     ----------------------------         PCT < 0.25 ng/mL                PCT < 0.10 ng/mL          Strongly encourage             Strongly discourage   discontinuation of antibiotics    initiation of antibiotics    ----------------------------     -----------------------------       PCT 0.25 - 0.50 ng/mL            PCT 0.10 - 0.25 ng/mL               OR       >80% decrease in PCT  Discourage initiation of                                            antibiotics      Encourage discontinuation           of antibiotics    ----------------------------     -----------------------------         PCT >= 0.50 ng/mL              PCT 0.26 - 0.50 ng/mL               AND        <80% decrease in PCT             Encourage initiation of                                             antibiotics       Encourage continuation           of antibiotics    ----------------------------     -----------------------------        PCT >= 0.50 ng/mL                  PCT > 0.50 ng/mL               AND         increase in PCT                  Strongly encourage                                      initiation of antibiotics    Strongly encourage escalation           of antibiotics                                     -----------------------------                                            PCT <= 0.25 ng/mL                                                 OR                                        > 80% decrease in PCT                                      Discontinue / Do not initiate  antibiotics  Performed at [facility], [address] [place], [place] [address]   Lactate dehydrogenase     Status: Abnormal   Collection Time: 06/08/20  2:25 AM  Result Value Ref Range   LDH 239 (H) 98 - 192 U/L    Comment: Performed at [facility], [address] [place], [place] [address]  Ferritin     Status: None   Collection Time: 06/08/20  2:25 AM  Result Value Ref Range   Ferritin 238 11 - 307 ng/mL    Comment: Performed at [facility], [address] [place], [place] [address]  Triglycerides     Status: None   Collection Time: 06/08/20  2:25 AM  Result Value Ref Range   Triglycerides 84 <150 mg/dL    Comment: Performed at [facility], [address] [place], [place] [address]  Fibrinogen     Status: Abnormal   Collection Time: 06/08/20  2:25 AM  Result Value Ref Range   Fibrinogen 523 (H) 210 - 475 mg/dL    Comment: Performed at [facility], [address] [place], [place] [address]  C-reactive protein     Status: Abnormal   Collection Time: 06/08/20  2:25 AM  Result Value Ref Range   CRP 2.0 (H) <1.0 mg/dL    Comment: Performed at [facility], [address] [place], [place] [address]  SARS Coronavirus 2 by RT PCR (hospital order, performed in [facility] hospital lab) Nasopharyngeal Nasopharyngeal Swab     Status: Abnormal   Collection Time: 06/08/20  2:50 AM   Specimen: Nasopharyngeal Swab  Result Value Ref Range   SARS Coronavirus 2 POSITIVE (A) NEGATIVE    Comment: RESULT CALLED TO, READ BACK BY AND VERIFIED WITH: NEAL,K AT 0414 ON [id number] BY CHERESNOWSKY,T (NOTE) SARS-CoV-2 target nucleic acids are DETECTED  SARS-CoV-2 RNA is generally detectable  in upper respiratory specimens  during Latoya acute phase of infection.  Positive results are indicative  of Latoya presence of Latoya identified virus, but do not rule out bacterial infection or co-infection with other pathogens not detected by Latoya test.  Clinical correlation with Rios history and  other diagnostic information is necessary to determine Rios infection status.  Latoya expected result is negative.  Fact Sheet for Patients:   BoilerBrush.com.cyhttps://www.fda.gov/media/136312/download   Fact Sheet for Healthcare Providers:   https://pope.com/https://www.fda.gov/media/136313/download    This test is not yet approved or cleared by [place] FDA and  has been authorized for detection and/or diagnosis of SARS-CoV-2 by FDA under an Emergency Use Authorization (EUA).  This EUA will remain in effect (meaning  this test can be used) for Latoya duration of  Latoya COVID-19 declaration under Section 564(b)(1) of Latoya Act, 21 U.S.C. section 360-bbb-3(b)(1), unless Latoya authorization is terminated or revoked sooner.  Performed at [facility], [address] [place], [place] [address]   Troponin I (High Sensitivity)     Status: Abnormal   Collection Time: 06/08/20  4:32 AM  Result Value Ref Range   Troponin I (High Sensitivity) 194 (HH) <18 ng/L    Comment: CRITICAL RESULT CALLED TO, READ BACK BY AND VERIFIED WITH: NEAL,K AT 0515 ON [id number] BY CHERESNOWSKY,T  DELTA CHECK NOTED (NOTE) Elevated high sensitivity troponin I (hsTnI) values and significant  changes across serial measurements may suggest ACS but many other  chronic and acute conditions are known to elevate hsTnI results.  Refer to [facility]  Links section for chest pain algorithms and additional  guidance. Performed at [facility], [address] [place], [place] [address]   Heparin level (unfractionated)     Status: None   Collection Time: 06/08/20 12:08 PM  Result Value Ref Range   Heparin Unfractionated 0.33 0.30 - 0.70 IU/mL     Comment: (NOTE) If heparin results are below expected values, and Rios dosage has  been confirmed, suggest follow up testing of antithrombin III levels. Performed at [facility], [address] [place], [place] [address]    @[email or web address] @  Blood pressure (!) 182/71, pulse 73, temperature 98.6 F (37 C), resp. rate 19, height 5\' 1"  (1.549 m), weight 93.4 kg, SpO2 98 %.   Assessment/Plan Problem  Acute Hypoxemic Respiratory Failure Due to Covid-19 (Hcc)  Pneumonia Due to Covid-19 Virus  Accelerated Hypertension  Obesity (Bmi 30-39.9)  Demand Ischemia (Hcc)    Acute hypoxemic respiratory failure: Mild. Pt is saturating in Latoya mid to high 90's on 2L O2 by nasal canulla. Monitor. She is receiving nebulizer treatments.  Pneumonia due to COVID-19: CXR demonstrates patchy scattered interstitial infiltrates. Pt presents with shortness of breath, cough, congestion, and chest tightness. Latoya Rios will receive Remdesevir, Dexamethasone, and a heparin drip due to elevated troponins and for prophylaxis to thrombosis due to Latoya Rios's obesity with BMI greater than 38 and elevated D Dimer. Inflammatory markers will be followed closely.  Elevated troponins due to demand ischemia: Latoya Rios presented with complaints of chest tightness. Troponin initially was 129 and increased to 197. EKG was without signs of acute ischemia. It did demonstrate LBBB, and we do not have a previous EKG for comparison. Latoya Rios is on a heparin drip and has been given an aspirin. It is thought that Latoya elevated cardiac enzymes are due to increased demand due to COVID-19 and her accelerated hypertension. Her blood pressure is being addressed with increased dose of Coreg. We will recheck troponins and EKG.  Accelerated Hypertension: Systolic pressures since presentation have been 206-174 with diastolic pressures ranging from 60-98. Latoya Rios asserts that she did have hypertension, but that she weaned herself  off of Coreg with her last dose being Christmas Eve of 2020. She has lost 70+ pounds and states that is why she no longer has hypertension. She states that elevated blood pressure is due to her acute illness. I have increased Latoya dose of Coreg given her at [facility] to 6.25 from 3.125 bid.   Obesity: Increased Rios's risk of a difficult course and worsening of her respiratory issues due to COVID-19.  I have seen and examined this Rios myself. I have spent 82 minutes in her evaluation and care.  DVT Prophylaxis: Heparin Drip CODE STATUS: Full Code Family Communication: None Disposition: Latoya Rios will be admitted as inpatient to a progressive bed.  Severity of Illness: Latoya appropriate Rios status for this Rios is INPATIENT. Inpatient status is judged to be reasonable and necessary in order to provide Latoya required intensity of service to ensure Latoya Rios's safety. Latoya Rios's presenting symptoms, physical exam findings, and initial radiographic and laboratory data in Latoya context of their chronic comorbidities is felt to place them at high risk for further clinical deterioration. Furthermore, it is not anticipated that Latoya Rios will be medically stable for discharge from Latoya hospital within 2 midnights of admission. Latoya following factors support Latoya Rios status of inpatient.   " Latoya Rios's presenting symptoms include shortness of breath, cough, low  grade temp, and chest tightness. " Latoya worrisome physical exam findings include Obesity, accelerated hypertension, edema of lower extremities. " Latoya initial radiographic and laboratory data are worrisome because of elevaated troponins with trend upwards, elevated inflammatory markers, positive COVID-19 status. " Latoya chronic co-morbidities include hypertension and morbid obesity.  * I certify that at Latoya point of admission it is my clinical judgment that Latoya Rios will require inpatient hospital care spanning beyond 2 midnights from  Latoya point of admission due to high intensity of service, high risk for further deterioration and high frequency of surveillance required.*  Yomara Toothman 06/08/2020, 7:24 PM

## 2020-06-08 NOTE — ED Notes (Signed)
Second lactic discontinued per EDP 

## 2020-06-08 NOTE — ED Provider Notes (Addendum)
[facility] EMERGENCY DEPARTMENT Provider Note   CSN: [id number] Arrival date & time: 06/08/20  0143     History Chief Complaint  Patient presents with  . Shortness of Breath    Latoya Rios is a 64 y.o. female.  The history is provided by the patient.  Shortness of Breath Severity:  Severe Onset quality:  Gradual Duration:  1 day Timing:  Constant Progression:  Worsening Chronicity:  New Context: not activity   Relieved by:  Nothing Worsened by:  Nothing Ineffective treatments:  None tried Associated symptoms: cough and fever   Associated symptoms: no abdominal pain, no headaches, no rash, no sputum production, no vomiting and no wheezing   Cough:    Cough characteristics:  Non-productive   Severity:  Moderate   Onset quality:  Gradual   Timing:  Sporadic   Progression:  Unchanged   Chronicity:  New Risk factors: no hx of PE/DVT   Patient with HTN presents with 1 day of SOB, cough, congestion and low grade fever.  No leg swelling. Some chest tightness.  No loss of taste or smell, No diarrhea.  Patient is not vaccinated for covid as she has ongoing knee pain.       Past Medical History:  Diagnosis Date  . Heart murmur   . Hypertension   . Mixed hyperlipidemia   . Obese     There are no problems to display for this patient.   History reviewed. No pertinent surgical history.   OB History   No obstetric history on file.     No family history on file.  Social History   Tobacco Use  . Smoking status: Never Smoker  Substance Use Topics  . Alcohol use: Never  . Drug use: Never    Home Medications Prior to Admission medications   Medication Sig Start Date End Date Taking? Authorizing Provider  carvedilol (COREG) 3.125 MG tablet Take 3.125 mg by mouth 2 (two) times daily with a meal.   Yes [provider]  losartan-hydrochlorothiazide (HYZAAR) 100-25 MG tablet Take 1 tablet by mouth daily.   Yes [provider]     Allergies    Clonidine, Nisoldipine, Penicillins, Quinacrine, and Quinapril  Review of Systems   Review of Systems  Constitutional: Positive for fever.  HENT: Positive for congestion.   Eyes: Negative for visual disturbance.  Respiratory: Positive for cough and shortness of breath. Negative for sputum production and wheezing.   Cardiovascular: Negative for palpitations and leg swelling.  Gastrointestinal: Negative for abdominal pain and vomiting.  Genitourinary: Negative for difficulty urinating.  Musculoskeletal: Negative for arthralgias.  Skin: Negative for rash.  Neurological: Negative for headaches.  Psychiatric/Behavioral: Negative for agitation.  All other systems reviewed and are negative.   Physical Exam Updated Vital Signs BP (!) 234/110   Pulse (!) 109   Temp 99.8 F (37.7 C) (Oral)   Resp (!) 25   Ht 5\' 1"  (1.549 m)   Wt 93.4 kg   SpO2 95%   BMI 38.91 kg/m   Physical Exam Vitals and nursing note reviewed.  Constitutional:      Appearance: Normal appearance. She is not diaphoretic.  HENT:     Head: Normocephalic and atraumatic.     Nose: Nose normal.  Eyes:     Conjunctiva/sclera: Conjunctivae normal.     Pupils: Pupils are equal, round, and reactive to light.  Cardiovascular:     Rate and Rhythm: Normal rate and regular rhythm.  Pulses: Normal pulses.     Heart sounds: Normal heart sounds.  Pulmonary:     Effort: Pulmonary effort is normal.     Breath sounds: Normal breath sounds. No wheezing or rales.  Abdominal:     General: Abdomen is flat. Bowel sounds are normal.     Palpations: Abdomen is soft.     Tenderness: There is no abdominal tenderness.  Musculoskeletal:        General: Normal range of motion.     Cervical back: Normal range of motion and neck supple.     Right lower leg: No edema.     Left lower leg: No edema.  Skin:    General: Skin is warm and dry.     Capillary Refill: Capillary refill takes less than 2 seconds.   Neurological:     General: No focal deficit present.     Mental Status: She is alert and oriented to person, place, and time.  Psychiatric:        Mood and Affect: Mood normal.        Behavior: Behavior normal.     ED Results / Procedures / Treatments   Labs (all labs ordered are listed, but only abnormal results are displayed) Results for orders placed or performed during the hospital encounter of 06/08/20  CBC with Differential/Platelet  Result Value Ref Range   WBC 11.4 (H) 4.0 - 10.5 K/uL   RBC 5.07 3.87 - 5.11 MIL/uL   Hemoglobin 14.5 12.0 - 15.0 g/dL   HCT 68.3 36 - 46 %   MCV 87.2 80.0 - 100.0 fL   MCH 28.6 26.0 - 34.0 pg   MCHC 32.8 30.0 - 36.0 g/dL   RDW 41.9 62.2 - 29.7 %   Platelets 300 150 - 400 K/uL   nRBC 0.0 0.0 - 0.2 %   Neutrophils Relative % 81 %   Neutro Abs 9.3 (H) 1.7 - 7.7 K/uL   Lymphocytes Relative 7 %   Lymphs Abs 0.8 0.7 - 4.0 K/uL   Monocytes Relative 10 %   Monocytes Absolute 1.2 (H) 0 - 1 K/uL   Eosinophils Relative 1 %   Eosinophils Absolute 0.1 0 - 0 K/uL   Basophils Relative 0 %   Basophils Absolute 0.0 0 - 0 K/uL   Immature Granulocytes 1 %   Abs Immature Granulocytes 0.07 0.00 - 0.07 K/uL  Brain natriuretic peptide  Result Value Ref Range   B Natriuretic Peptide 297.3 (H) 0.0 - 100.0 pg/mL  Lactic acid, plasma  Result Value Ref Range   Lactic Acid, Venous 1.3 0.5 - 1.9 mmol/L  Comprehensive metabolic panel  Result Value Ref Range   Sodium 138 135 - 145 mmol/L   Potassium 3.6 3.5 - 5.1 mmol/L   Chloride 101 98 - 111 mmol/L   CO2 25 22 - 32 mmol/L   Glucose, Bld 109 (H) 70 - 99 mg/dL   BUN 17 8 - 23 mg/dL   Creatinine, Ser 9.89 0.44 - 1.00 mg/dL   Calcium 8.9 8.9 - 21.1 mg/dL   Total Protein 7.5 6.5 - 8.1 g/dL   Albumin 3.6 3.5 - 5.0 g/dL   AST 26 15 - 41 U/L   ALT 30 0 - 44 U/L   Alkaline Phosphatase 95 38 - 126 U/L   Total Bilirubin 0.5 0.3 - 1.2 mg/dL   GFR calc non Af Amer >60 >60 mL/min   GFR calc Af Amer >60 >60  mL/min   Anion gap 12  5 - 15  D-dimer, quantitative  Result Value Ref Range   D-Dimer, Quant 1.31 (H) 0.00 - 0.50 ug/mL-FEU  Troponin I (High Sensitivity)  Result Value Ref Range   Troponin I (High Sensitivity) 129 (HH) <18 ng/L   DG Chest Portable 1 View  Result Date: 06/08/2020 CLINICAL DATA:  Shortness of breath and cough for 2 days EXAM: PORTABLE CHEST 1 VIEW COMPARISON:  None. FINDINGS: Cardiac shadow is within normal limits. Mild vascular congestion is noted centrally without interstitial edema. No focal infiltrate is noted. No bony abnormality is seen. IMPRESSION: Mild congestive failure without significant edema. Electronically Signed   By: Alcide CleverMark  Lukens M.D.   On: 06/08/2020 02:02    EKG See epic  Radiology DG Chest Portable 1 View  Result Date: 06/08/2020 CLINICAL DATA:  Shortness of breath and cough for 2 days EXAM: PORTABLE CHEST 1 VIEW COMPARISON:  None. FINDINGS: Cardiac shadow is within normal limits. Mild vascular congestion is noted centrally without interstitial edema. No focal infiltrate is noted. No bony abnormality is seen. IMPRESSION: Mild congestive failure without significant edema. Electronically Signed   By: Alcide CleverMark  Lukens M.D.   On: 06/08/2020 02:02    Procedures Procedures (including critical care time)  Medications Ordered in ED Medications  heparin ADULT infusion 100 units/mL (25000 units/24450mL sodium chloride 0.45%) (900 Units/hr Intravenous New Bag/Given 06/08/20 0333)  remdesivir 100 mg in sodium chloride 0.9 % 100 mL IVPB (has no administration in time range)  dexamethasone (DECADRON) injection 6 mg (6 mg Intravenous Given 06/08/20 0234)  furosemide (LASIX) injection 40 mg (40 mg Intravenous Given 06/08/20 0234)  acetaminophen (TYLENOL) tablet 1,000 mg (1,000 mg Oral Given 06/08/20 0231)  ondansetron (ZOFRAN) injection 4 mg (4 mg Intravenous Given 06/08/20 0259)  heparin bolus via infusion 4,000 Units (4,000 Units Intravenous Bolus from Bag 06/08/20 0333)   remdesivir 100 mg in sodium chloride 0.9 % 100 mL IVPB (0 mg Intravenous Stopped 06/08/20 0644)    ED Course  I have reviewed the triage vital signs and the nursing notes.  Pertinent labs & imaging results that were available during my care of the patient were reviewed by me and considered in my medical decision making (see chart for details).    MDM Reviewed: nursing note and vitals (care everywhere reviewed.  ) Interpretation: labs, ECG and x-ray (elevated troponin and Ddimer No PNA by me on CXR) Total time providing critical care: 30-74 minutes (heparin drip initiated with elevated dimer and troponin ). This excludes time spent performing separately reportable procedures and services. Consults: admitting MD  CRITICAL CARE Performed by: Kahle Mcqueen K Shigeo Baugh-Rasch Total critical care time: 60 minutes Critical care time was exclusive of separately billable procedures and treating other patients. Critical care was necessary to treat or prevent imminent or life-threatening deterioration. Critical care was time spent personally by me on the following activities: development of treatment plan with patient and/or surrogate as well as nursing, discussions with consultants, evaluation of patient's response to treatment, examination of patient, obtaining history from patient or surrogate, ordering and performing treatments and interventions, ordering and review of laboratory studies, ordering and review of radiographic studies, pulse oximetry and re-evaluation of patient's condition.   Elevated troponin in setting of covid/extreme HTN is likely related to demand ischemia.  There are no signs of a STEMI on the EKG.  I do not believe this is a stemi.  I immediately heparinized the patient in the setting of elevated troponin.  BP reduction initiated in the ED.  Latoya Rios was evaluated in Emergency Department on 06/08/2020 for the symptoms described in the history of present illness. She was evaluated  in the context of the global COVID-19 pandemic, which necessitated consideration that the patient might be at risk for infection with the SARS-CoV-2 virus that causes COVID-19. Institutional protocols and algorithms that pertain to the evaluation of patients at risk for COVID-19 are in a state of rapid change based on information released by regulatory bodies including the CDC and federal and state organizations. These policies and algorithms were followed during the patient's care in the ED.  Final Clinical Impression(s) / ED Diagnoses Final diagnoses:  None  Admit to medicine for covid 19 with an elevated troponin and HTN urgency          Peirce Deveney, MD 06/08/20 3202    Nicanor Alcon, Andraya Frigon, MD 06/08/20 3343

## 2020-06-09 ENCOUNTER — Encounter (HOSPITAL_COMMUNITY): Payer: Self-pay | Admitting: Internal Medicine

## 2020-06-09 ENCOUNTER — Inpatient Hospital Stay (HOSPITAL_COMMUNITY): Payer: BC Managed Care – PPO

## 2020-06-09 DIAGNOSIS — I340 Nonrheumatic mitral (valve) insufficiency: Secondary | ICD-10-CM

## 2020-06-09 LAB — CBC WITH DIFFERENTIAL/PLATELET
Abs Immature Granulocytes: 0.08 10*3/uL — ABNORMAL HIGH (ref 0.00–0.07)
Basophils Absolute: 0 10*3/uL (ref 0.0–0.1)
Basophils Relative: 0 %
Eosinophils Absolute: 0 10*3/uL (ref 0.0–0.5)
Eosinophils Relative: 0 %
HCT: 45.7 % (ref 36.0–46.0)
Hemoglobin: 14.5 g/dL (ref 12.0–15.0)
Immature Granulocytes: 1 %
Lymphocytes Relative: 10 %
Lymphs Abs: 0.9 10*3/uL (ref 0.7–4.0)
MCH: 28.7 pg (ref 26.0–34.0)
MCHC: 31.7 g/dL (ref 30.0–36.0)
MCV: 90.5 fL (ref 80.0–100.0)
Monocytes Absolute: 1 10*3/uL (ref 0.1–1.0)
Monocytes Relative: 11 %
Neutro Abs: 6.9 10*3/uL (ref 1.7–7.7)
Neutrophils Relative %: 78 %
Platelets: 305 10*3/uL (ref 150–400)
RBC: 5.05 MIL/uL (ref 3.87–5.11)
RDW: 13.3 % (ref 11.5–15.5)
WBC: 9 10*3/uL (ref 4.0–10.5)
nRBC: 0 % (ref 0.0–0.2)

## 2020-06-09 LAB — ECHOCARDIOGRAM COMPLETE
Area-P 1/2: 1.74 cm2
Height: 61 in
S' Lateral: 3.3 cm
Weight: 3294.55 oz

## 2020-06-09 LAB — C-REACTIVE PROTEIN: CRP: 1.9 mg/dL — ABNORMAL HIGH (ref <1.0)

## 2020-06-09 LAB — D-DIMER, QUANTITATIVE: D-Dimer, Quant: 0.94 ug/mL-FEU — ABNORMAL HIGH (ref 0.00–0.50)

## 2020-06-09 LAB — FERRITIN: Ferritin: 299 ng/mL (ref 11–307)

## 2020-06-09 LAB — MAGNESIUM: Magnesium: 2 mg/dL (ref 1.7–2.4)

## 2020-06-09 LAB — PHOSPHORUS: Phosphorus: 3.8 mg/dL (ref 2.5–4.6)

## 2020-06-09 LAB — HEPARIN LEVEL (UNFRACTIONATED)
Heparin Unfractionated: 0.52 IU/mL (ref 0.30–0.70)
Heparin Unfractionated: 0.47 IU/mL (ref 0.30–0.70)

## 2020-06-09 MED ORDER — DEXAMETHASONE 4 MG PO TABS
6.0000 mg | ORAL_TABLET | ORAL | Status: DC
  Administered 2020-06-09 – 2020-06-12 (×4): 6 mg via ORAL
  Filled 2020-06-09 (×4): qty 2

## 2020-06-09 MED ORDER — HYDRALAZINE HCL 50 MG PO TABS
50.0000 mg | ORAL_TABLET | Freq: Three times a day (TID) | ORAL | Status: DC
  Administered 2020-06-09 (×3): 50 mg via ORAL
  Filled 2020-06-09 (×3): qty 1

## 2020-06-09 MED ORDER — ISOSORBIDE MONONITRATE ER 30 MG PO TB24
30.0000 mg | ORAL_TABLET | Freq: Every day | ORAL | Status: DC
  Administered 2020-06-09 – 2020-06-12 (×4): 30 mg via ORAL
  Filled 2020-06-09 (×4): qty 1

## 2020-06-09 NOTE — Progress Notes (Signed)
ANTICOAGULATION CONSULT NOTE -  Consult  Pharmacy Consult for IV heparin  Indication: Elevated troponin   Allergies  Allergen Reactions  . Clonidine   . Nisoldipine   . Penicillins   . Quinacrine   . Quinapril     Patient Measurements: Height: 5\' 1"  (154.9 cm) Weight: 93.4 kg (205 lb 14.6 oz) IBW/kg (Calculated) : 47.8  Vital Signs: Temp: 98.2 F (36.8 C) (09/24 0550) BP: 188/75 (09/24 0550) Pulse Rate: 67 (09/24 0550)  Labs: Recent Labs    06/08/20 0225 06/08/20 0225 06/08/20 0432 06/08/20 1208 06/08/20 1913 06/08/20 2019 06/09/20 0412  HGB 14.5  --   --   --   --   --  14.5  HCT 44.2  --   --   --   --   --  45.7  PLT 300  --   --   --   --   --  305  HEPARINUNFRC  --   --   --  0.33 0.16*  --  0.52  CREATININE 0.71  --   --   --   --   --   --   TROPONINIHS 129*   < > 194*  --  95* 84*  --    < > = values in this interval not displayed.    Estimated Creatinine Clearance: 74 mL/min (by C-G formula based on SCr of 0.71 mg/dL).   Medical History: Past Medical History:  Diagnosis Date  . CHF (congestive heart failure) (HCC)   . Heart murmur   . Hypertension   . Mixed hyperlipidemia   . Obese     Assessment: 64 y/o F with shortness of breath and mildly elevated troponin. Starting heparin for now. CBC/renal function good. PTA meds reviewed, not on AC PTA.  Today, 06/09/20  HL is 0.52, therapeutic   CBC is WNL  SCr <1 ( 9/23)  No line or bleeding issues per RN    Goal of Therapy:  Heparin level 0.3-0.7 units/ml Monitor platelets by anticoagulation protocol: Yes   Plan:   Continue heparin drip to 1100 units/hr  Obtain confirmatory 6 hour HL, if therapeutic then check daily  Daily CBC  Monitor for bleeding   09-04-2000, PharmD, BCPS 06/09/2020 6:26 AM

## 2020-06-09 NOTE — Progress Notes (Signed)
ANTICOAGULATION CONSULT NOTE -  Consult  Pharmacy Consult for IV heparin  Indication: Elevated troponin /ACS  Allergies  Allergen Reactions  . Clonidine   . Nisoldipine   . Penicillins   . Quinacrine   . Quinapril     Patient Measurements: Height: 5\' 1"  (154.9 cm) Weight: 93.4 kg (205 lb 14.6 oz) IBW/kg (Calculated) : 47.8  Vital Signs: Temp: 98 F (36.7 C) (09/24 0839) Temp Source: Oral (09/24 0839) BP: 155/73 (09/24 0839) Pulse Rate: 65 (09/24 0839)  Labs: Recent Labs    06/08/20 0225 06/08/20 0225 06/08/20 0432 06/08/20 1208 06/08/20 1913 06/08/20 2019 06/09/20 0412 06/09/20 1003  HGB 14.5  --   --   --   --   --  14.5  --   HCT 44.2  --   --   --   --   --  45.7  --   PLT 300  --   --   --   --   --  305  --   HEPARINUNFRC  --   --   --    < > 0.16*  --  0.52 0.47  CREATININE 0.71  --   --   --   --   --   --   --   TROPONINIHS 129*   < > 194*  --  95* 84*  --   --    < > = values in this interval not displayed.    Estimated Creatinine Clearance: 74 mL/min (by C-G formula based on SCr of 0.71 mg/dL).  Medical History: Past Medical History:  Diagnosis Date  . CHF (congestive heart failure) (HCC)   . Heart murmur   . Hypertension   . Mixed hyperlipidemia   . Obese    Assessment: 64 y/oF with shortness of breath and mildly elevated troponin. Pharmacy consulted for heparin dosing for ACS. Pt not taking anticoagulants PTA.  Today, 06/09/20  Confirmatory HL = 0.47 remains therapeutic on heparin infusion of 1100 units/hr  Confirmed with RN that heparin infusing at correct rate. No signs of bleeding  CBC: Stable, WNL  SCr 0.7, CrCl 74 mL/min  Goal of Therapy:  Heparin level 0.3-0.7 units/ml Monitor platelets by anticoagulation protocol: Yes   Plan:   Continue heparin infusion at current rate of 1100 units/hr  HL, CBC daily while on heparin infusion  Monitor for signs/symptoms of bleeding  06/11/20, PharmD 06/09/20 11:27 AM

## 2020-06-09 NOTE — Progress Notes (Signed)
  Echocardiogram 2D Echocardiogram has been performed.  Celene Skeen 06/09/2020, 4:00 PM

## 2020-06-09 NOTE — Progress Notes (Signed)
[facility] Progress Note  Patient: Latoya Rios    XTA:[id number]  DOA: 06/08/2020     Date of Service: the patient was seen and examined on 06/09/2020  Brief hospital course: Past medical history of obesity, HTN, chronic CHF presents with complaints of shortness of breath cough and fever found to have COVID-19 pneumonia as well as hypertensive urgency associated with acute on chronic diastolic CHF. Currently plan is continue diuresis and rate control..  Assessment and Plan: 1.  Acute hypoxic respiratory failure secondary to acute on chronic diastolic CHF. Accelerated hypertension. Mildly elevated troponins. Presents with complaints of cough and shortness of breath. Chest x-ray shows congestion. BNP elevated.  Check troponins mildly elevated not consistent with ACS. EKG shows LBBB no prior EKG to compare. Patient denies having any complaints of chest pain, chest tightness, chest heaviness or anginal-like symptoms prior to admission and recent history. Patient denies any prior history of cardiac work-up or cardiac treatment. Family of the patient are not aware of having any prior information about having an LBBB. Patient was started on IV heparin.  We will continue with that for now. Follow-up on echocardiogram. If echocardiogram is showing normal EF without any wall motion abnormality or significant diastolic dysfunction, we will transition to DVT prophylaxis.  Patient will still benefit from outpatient follow-up with cardiology to establish care. Blood pressure still remains elevated.  Patient treated with losartan and carvedilol.  I will add Imdur and hydralazine. Received IV diuresis.  Currently volume appears to be stable.  Monitor.  2.  Acute COVID-19 pneumonia. Chest x-ray shows patchy infiltrate as well. Procalcitonin negative. Started on remdesivir and steroids.  Will continue.  3.  Obesity Body mass index is 38.91 kg/m.  Placing the patient at high risk for poor  outcomes from COVID-19 pneumonia.  4.  Goals of care conversation Prolonged duration with patient as well as patient's daughter on phone. Patient wanted me to have a goals of care conversation with the daughter. Patient does not want CPR, does not want a ventilator or intubation at any cost. Patient does not want to have be shocked for cardiac arrest but she would like elective cardioversion should she need 1. Patient will be okay with NG tube as well as PEG tube insertion. Patient would be okay with IV fluids and IV antibiotics.   Diet: Continue cardiac diet DVT Prophylaxis: Therapeutic heparin     Advance goals of care discussion: Limited code  Family Communication: no family was present at bedside, at the time of interview.  Discussed with daughter on the phone. The pt provided permission to discuss medical plan with the family. Opportunity was given to ask question and all questions were answered satisfactorily.   Disposition:  Status is: Inpatient  Remains inpatient appropriate because:Inpatient level of care appropriate due to severity of illness   Dispo: The patient is from: Home              Anticipated d/c is to: Home              Anticipated d/c date is: 2 days              Patient currently is not medically stable to d/c.  Subjective: Continues to have shortness of breath.  No nausea no vomiting.  No fever no chills.  No chest pain or chest illness no chest tightness.  Physical Exam:  General: Appear in mild distress, no Rash; Oral Mucosa Clear, moist. no Abnormal Neck Mass Or lumps, Conjunctiva  normal  Cardiovascular: S1 and S2 Present, no Murmur, Respiratory: good respiratory effort, Bilateral Air entry present and bilateral  Crackles, no wheezes Abdomen: Bowel Sound present, Soft and no tenderness Extremities: no Pedal edema Neurology: alert and oriented to time, place, and person affect appropriate. no new focal deficit Gait not checked due to patient safety  concerns  Vitals:   06/09/20 0550 06/09/20 0751 06/09/20 0839 06/09/20 1416  BP: (!) 188/75 (S) (!) 203/86 (!) 155/73 (!) 162/70  Pulse: 67  65 67  Resp: (!) 22  (!) 21 18  Temp: 98.2 F (36.8 C)  98 F (36.7 C) 98.2 F (36.8 C)  TempSrc:   Oral Oral  SpO2: 97%  96% 97%  Weight:      Height:        Intake/Output Summary (Last 24 hours) at 06/09/2020 1909 Last data filed at 06/09/2020 1725 Gross per 24 hour  Intake 1246.87 ml  Output 1850 ml  Net -603.13 ml   Filed Weights   06/08/20 0158  Weight: 93.4 kg    Data Reviewed: I have personally reviewed and interpreted daily labs, tele strips, imagings as discussed above. I reviewed all nursing notes, pharmacy notes, vitals, pertinent old records I have discussed plan of care as described above with RN and patient/family.  CBC: Recent Labs  Lab 06/08/20 0225 06/09/20 0412  WBC 11.4* 9.0  NEUTROABS 9.3* 6.9  HGB 14.5 14.5  HCT 44.2 45.7  MCV 87.2 90.5  PLT 300 305   Basic Metabolic Panel: Recent Labs  Lab 06/08/20 0225 06/09/20 0412  NA 138  --   K 3.6  --   CL 101  --   CO2 25  --   GLUCOSE 109*  --   BUN 17  --   CREATININE 0.71  --   CALCIUM 8.9  --   MG  --  2.0  PHOS  --  3.8    Studies: ECHOCARDIOGRAM COMPLETE  Result Date: 06/09/2020    ECHOCARDIOGRAM REPORT   Patient Name:   Latoya Rios Date of Exam: 06/09/2020 Medical Rec #:  [id number]     Height:       61.0 in Accession #:    [id number]    Weight:       205.9 lb Date of Birth:  06-Feb-1956      BSA:          1.912 m Patient Age:    64 years      BP:           162/70 mmHg Patient Gender: F             HR:           67 bpm. Exam Location:  Inpatient Procedure: 2D Echo Indications:    elevated troponin  History:        Patient has no prior history of Echocardiogram examinations.                 CHF; Risk Factors:Hypertension and Dyslipidemia.  Sonographer:    Celene Skeen RDCS (AE) Referring Phys: [id number] [facility] M Montie Gelardi IMPRESSIONS  1. Left ventricular  ejection fraction, by estimation, is 60 to 65%. The left ventricle has normal function. The left ventricle has no regional wall motion abnormalities. Left ventricular diastolic parameters are consistent with Grade I diastolic dysfunction (impaired relaxation).  2. Right ventricular systolic function is normal. The right ventricular size is normal. Tricuspid regurgitation signal is inadequate for assessing PA pressure.  3. The mitral valve is grossly normal. Mild mitral valve regurgitation. No evidence of mitral stenosis.  4. The aortic valve is grossly normal. Aortic valve regurgitation is not visualized. No aortic stenosis is present.  5. The inferior vena cava is normal in size with <50% respiratory variability, suggesting right atrial pressure of 8 mmHg. FINDINGS  Left Ventricle: Left ventricular ejection fraction, by estimation, is 60 to 65%. The left ventricle has normal function. The left ventricle has no regional wall motion abnormalities. The left ventricular internal cavity size was normal in size. There is  no left ventricular hypertrophy. Left ventricular diastolic parameters are consistent with Grade I diastolic dysfunction (impaired relaxation). Right Ventricle: The right ventricular size is normal. No increase in right ventricular wall thickness. Right ventricular systolic function is normal. Tricuspid regurgitation signal is inadequate for assessing PA pressure. Left Atrium: Left atrial size was normal in size. Right Atrium: Right atrial size was normal in size. Pericardium: Trivial pericardial effusion is present. Presence of pericardial fat pad. Mitral Valve: The mitral valve is grossly normal. Mild mitral valve regurgitation. No evidence of mitral valve stenosis. Tricuspid Valve: The tricuspid valve is grossly normal. Tricuspid valve regurgitation is trivial. No evidence of tricuspid stenosis. Aortic Valve: The aortic valve is grossly normal. Aortic valve regurgitation is not visualized. No aortic  stenosis is present. Pulmonic Valve: The pulmonic valve was grossly normal. Pulmonic valve regurgitation is not visualized. No evidence of pulmonic stenosis. Aorta: The aortic root is normal in size and structure. Venous: The inferior vena cava is normal in size with less than 50% respiratory variability, suggesting right atrial pressure of 8 mmHg. IAS/Shunts: The atrial septum is grossly normal.  LEFT VENTRICLE PLAX 2D LVIDd:         5.30 cm  Diastology LVIDs:         3.30 cm  LV e' lateral:   7.72 cm/s LV PW:         1.00 cm  LV E/e' lateral: 12.5 LV IVS:        1.10 cm LVOT diam:     1.70 cm LV SV:         49 LV SV Index:   26 LVOT Area:     2.27 cm  RIGHT VENTRICLE RV S prime:     15.20 cm/s TAPSE (M-mode): 2.1 cm LEFT ATRIUM             Index       RIGHT ATRIUM           Index LA diam:        3.40 cm 1.78 cm/m  RA Area:     21.60 cm LA Vol (A2C):   47.0 ml 24.58 ml/m RA Volume:   61.80 ml  32.32 ml/m LA Vol (A4C):   52.5 ml 27.45 ml/m LA Biplane Vol: 49.7 ml 25.99 ml/m  AORTIC VALVE LVOT Vmax:   101.00 cm/s LVOT Vmean:  75.300 cm/s LVOT VTI:    0.216 m  AORTA Ao Root diam: 2.50 cm MITRAL VALVE MV Area (PHT): 1.74 cm     SHUNTS MV Decel Time: 437 msec     Systemic VTI:  0.22 m MV E velocity: 96.40 cm/s   Systemic Diam: 1.70 cm MV A velocity: 105.00 cm/s MV E/A ratio:  0.92 Lennie Odor MD Electronically signed by Lennie Odor MD Signature Date/Time: 06/09/2020/5:47:58 PM    Final     Scheduled Meds: . vitamin C  500 mg Oral Daily  .  aspirin EC  81 mg Oral Daily  . carvedilol  6.25 mg Oral BID WC  . dexamethasone  6 mg Oral Q24H  . hydrALAZINE  50 mg Oral Q8H  . Ipratropium-Albuterol  1 puff Inhalation Q6H  . isosorbide mononitrate  30 mg Oral Daily  . losartan  100 mg Oral Daily  . sodium chloride flush  3 mL Intravenous Q12H  . zinc sulfate  220 mg Oral Daily   Continuous Infusions: . heparin 1,100 Units/hr (06/09/20 1725)  . remdesivir 100 mg in NS 100 mL Stopped (06/09/20 1011)    PRN Meds: acetaminophen, chlorpheniramine-HYDROcodone, guaiFENesin-dextromethorphan, ondansetron **OR** ondansetron (ZOFRAN) IV, senna-docusate  Time spent: 35 minutes  Author: Lynden OxfordPranav Jla Reynolds, MD [facility] 06/09/2020 7:09 PM  To reach On-call, see care teams to locate the attending and reach out via [email or web address]. Between 7PM-7AM, please contact night-coverage If you still have difficulty reaching the attending provider, please page the [facility] ([facility]) for [facility] on amion for assistance.

## 2020-06-10 LAB — D-DIMER, QUANTITATIVE: D-Dimer, Quant: 0.63 ug/mL-FEU — ABNORMAL HIGH (ref 0.00–0.50)

## 2020-06-10 LAB — CBC WITH DIFFERENTIAL/PLATELET
Abs Immature Granulocytes: 0.1 10*3/uL — ABNORMAL HIGH (ref 0.00–0.07)
Basophils Absolute: 0 10*3/uL (ref 0.0–0.1)
Basophils Relative: 0 %
Eosinophils Absolute: 0 10*3/uL (ref 0.0–0.5)
Eosinophils Relative: 0 %
HCT: 43.4 % (ref 36.0–46.0)
Hemoglobin: 14.1 g/dL (ref 12.0–15.0)
Immature Granulocytes: 1 %
Lymphocytes Relative: 16 %
Lymphs Abs: 2.3 10*3/uL (ref 0.7–4.0)
MCH: 28.7 pg (ref 26.0–34.0)
MCHC: 32.5 g/dL (ref 30.0–36.0)
MCV: 88.2 fL (ref 80.0–100.0)
Monocytes Absolute: 1.7 10*3/uL — ABNORMAL HIGH (ref 0.1–1.0)
Monocytes Relative: 13 %
Neutro Abs: 9.6 10*3/uL — ABNORMAL HIGH (ref 1.7–7.7)
Neutrophils Relative %: 70 %
Platelets: 344 10*3/uL (ref 150–400)
RBC: 4.92 MIL/uL (ref 3.87–5.11)
RDW: 13.2 % (ref 11.5–15.5)
WBC: 13.7 10*3/uL — ABNORMAL HIGH (ref 4.0–10.5)
nRBC: 0 % (ref 0.0–0.2)

## 2020-06-10 LAB — HEPARIN LEVEL (UNFRACTIONATED): Heparin Unfractionated: 0.63 IU/mL (ref 0.30–0.70)

## 2020-06-10 LAB — MAGNESIUM: Magnesium: 1.9 mg/dL (ref 1.7–2.4)

## 2020-06-10 LAB — C-REACTIVE PROTEIN: CRP: 0.5 mg/dL (ref <1.0)

## 2020-06-10 LAB — PHOSPHORUS: Phosphorus: 4.4 mg/dL (ref 2.5–4.6)

## 2020-06-10 LAB — FERRITIN: Ferritin: 400 ng/mL — ABNORMAL HIGH (ref 11–307)

## 2020-06-10 MED ORDER — SALINE SPRAY 0.65 % NA SOLN
1.0000 | NASAL | Status: DC | PRN
  Filled 2020-06-10: qty 44

## 2020-06-10 MED ORDER — HYDRALAZINE HCL 50 MG PO TABS
100.0000 mg | ORAL_TABLET | Freq: Three times a day (TID) | ORAL | Status: DC
  Administered 2020-06-10 – 2020-06-12 (×7): 100 mg via ORAL
  Filled 2020-06-10 (×6): qty 2

## 2020-06-10 MED ORDER — AMLODIPINE BESYLATE 5 MG PO TABS
5.0000 mg | ORAL_TABLET | Freq: Every day | ORAL | Status: DC
  Administered 2020-06-10 – 2020-06-12 (×3): 5 mg via ORAL
  Filled 2020-06-10 (×3): qty 1

## 2020-06-10 MED ORDER — HYDROCHLOROTHIAZIDE 25 MG PO TABS
25.0000 mg | ORAL_TABLET | Freq: Every day | ORAL | Status: DC
  Administered 2020-06-10 – 2020-06-12 (×3): 25 mg via ORAL
  Filled 2020-06-10 (×3): qty 1

## 2020-06-10 MED ORDER — ENOXAPARIN SODIUM 40 MG/0.4ML ~~LOC~~ SOLN
40.0000 mg | SUBCUTANEOUS | Status: DC
  Administered 2020-06-10 – 2020-06-12 (×3): 40 mg via SUBCUTANEOUS
  Filled 2020-06-10 (×3): qty 0.4

## 2020-06-10 NOTE — Progress Notes (Signed)
SATURATION QUALIFICATIONS: (This note is used to comply with regulatory documentation for home oxygen)  Patient Saturations on Room Air at Rest = 96%  Patient Saturations on Room Air while Ambulating = 95%  Patient Saturations on 2 Liters of oxygen while Ambulating = 97%  Please briefly explain why patient needs home oxygen: Patient does not currently need oxygen at home. Patient's saturations are appropriate on room air.

## 2020-06-10 NOTE — Progress Notes (Signed)
Patient's blood pressures continue to be elevated even at rest. Will continue to monitor patients' blood pressures with new medicine regime.

## 2020-06-10 NOTE — Progress Notes (Signed)
[facility] Progress Note  Patient: Latoya Rios    FBP:[id number]  DOA: 06/08/2020     Date of Service: the patient was seen and examined on 06/10/2020  Brief hospital course: Past medical history of obesity, HTN, chronic CHF presents with complaints of shortness of breath cough and fever found to have COVID-19 pneumonia as well as hypertensive urgency associated with acute on chronic diastolic CHF. Currently plan is continue diuresis and rate control..  Assessment and Plan: 1.  Acute hypoxic respiratory failure secondary to acute on chronic diastolic CHF. Accelerated hypertension. Mildly elevated troponins. Presents with complaints of cough and shortness of breath. Chest x-ray shows congestion. BNP elevated.  Check troponins mildly elevated not consistent with ACS. EKG shows LBBB no prior EKG to compare. Patient denies having any complaints of chest pain, chest tightness, chest heaviness or anginal-like symptoms prior to admission and recent history. Patient denies any prior history of cardiac work-up or cardiac treatment. Family of the patient are not aware of having any prior information about having an LBBB. Patient was started on IV heparin.  Patient will still benefit from outpatient follow-up with cardiology to establish care. Blood pressure still remains elevated.  We will continue Coreg, losartan, Imdur. Increase hydralazine dose from 50 mg 3 times daily to 100 mg 3 times daily. Add hydrochlorothiazide. Discussed with patient regarding past experience with calcium channel blocker and she mentions that she did not have any reaction that included rash, hives, throat closing or shortness of breath or hospitalization therefore based on that discussion we will initiate Norvasc. Monitor.  2.  Acute COVID-19 pneumonia. Chest x-ray shows patchy infiltrate as well. Procalcitonin negative. Started on remdesivir and steroids.  Will continue. On room air on exertion.  3.   Obesity Body mass index is 38.91 kg/m.  Placing the patient at high risk for poor outcomes from COVID-19 pneumonia.  4.  Goals of care conversation Prolonged duration with patient as well as patient's daughter on phone. Patient wanted me to have a goals of care conversation with the daughter. Patient does not want CPR, does not want a ventilator or intubation at any cost. Patient does not want to have be shocked for cardiac arrest but she would like elective cardioversion should she need 1. Patient will be okay with NG tube as well as PEG tube insertion. Patient would be okay with IV fluids and IV antibiotics.   Diet: Continue cardiac diet DVT Prophylaxis: Therapeutic heparin enoxaparin (LOVENOX) injection 40 mg Start: 06/10/20 0900    Advance goals of care discussion: Limited code  Family Communication: no family was present at bedside, at the time of interview.  Discussed with daughter on the phone. The pt provided permission to discuss medical plan with the family. Opportunity was given to ask question and all questions were answered satisfactorily.   Disposition:  Status is: Inpatient  Remains inpatient appropriate because:Inpatient level of care appropriate due to severity of illness   Dispo: The patient is from: Home              Anticipated d/c is to: Home              Anticipated d/c date is: 2 days              Patient currently is not medically stable to d/c.  Subjective: No nausea no vomiting.  No fever no chills.  No chest pain no abdomen pain.  Physical Exam:  General: Appear in mild distress, no Rash; Oral  Mucosa Clear, moist. no Abnormal Neck Mass Or lumps, Conjunctiva normal  Cardiovascular: S1 and S2 Present, no Murmur, Respiratory: good respiratory effort, Bilateral Air entry present and bilateral  Crackles, no wheezes Abdomen: Bowel Sound present, Soft and no tenderness Extremities: no Pedal edema Neurology: alert and oriented to time, place, and  person affect appropriate. no new focal deficit Gait not checked due to patient safety concerns  Vitals:   06/09/20 1416 06/09/20 2038 06/10/20 0552 06/10/20 1500  BP: (!) 162/70 (!) 181/63 (!) 185/84 (!) 187/81  Pulse: 67 66 60 66  Resp: 18 20 18 18   Temp: 98.2 F (36.8 C) 98.5 F (36.9 C) 97.7 F (36.5 C)   TempSrc: Oral Oral    SpO2: 97% 97% 100% 96%  Weight:      Height:        Intake/Output Summary (Last 24 hours) at 06/10/2020 1840 Last data filed at 06/10/2020 1820 Gross per 24 hour  Intake 120 ml  Output --  Net 120 ml   Filed Weights   06/08/20 0158  Weight: 93.4 kg    Data Reviewed: I have personally reviewed and interpreted daily labs, tele strips, imagings as discussed above. I reviewed all nursing notes, pharmacy notes, vitals, pertinent old records I have discussed plan of care as described above with RN and patient/family.  CBC: Recent Labs  Lab 06/08/20 0225 06/09/20 0412 06/10/20 0533  WBC 11.4* 9.0 13.7*  NEUTROABS 9.3* 6.9 9.6*  HGB 14.5 14.5 14.1  HCT 44.2 45.7 43.4  MCV 87.2 90.5 88.2  PLT 300 305 344   Basic Metabolic Panel: Recent Labs  Lab 06/08/20 0225 06/09/20 0412 06/10/20 0533  NA 138  --   --   K 3.6  --   --   CL 101  --   --   CO2 25  --   --   GLUCOSE 109*  --   --   BUN 17  --   --   CREATININE 0.71  --   --   CALCIUM 8.9  --   --   MG  --  2.0 1.9  PHOS  --  3.8 4.4    Studies: No results found.  Scheduled Meds: . amLODipine  5 mg Oral Daily  . vitamin C  500 mg Oral Daily  . aspirin EC  81 mg Oral Daily  . carvedilol  6.25 mg Oral BID WC  . dexamethasone  6 mg Oral Q24H  . enoxaparin (LOVENOX) injection  40 mg Subcutaneous Q24H  . hydrALAZINE  100 mg Oral Q8H  . hydrochlorothiazide  25 mg Oral Daily  . Ipratropium-Albuterol  1 puff Inhalation Q6H  . isosorbide mononitrate  30 mg Oral Daily  . losartan  100 mg Oral Daily  . sodium chloride flush  3 mL Intravenous Q12H  . zinc sulfate  220 mg Oral Daily    Continuous Infusions: . remdesivir 100 mg in NS 100 mL 100 mg (06/10/20 0927)   PRN Meds: acetaminophen, chlorpheniramine-HYDROcodone, guaiFENesin-dextromethorphan, ondansetron **OR** ondansetron (ZOFRAN) IV, senna-docusate, sodium chloride  Time spent: 35 minutes  Author: 06/12/20, MD [facility] 06/10/2020 6:40 PM  To reach On-call, see care teams to locate the attending and reach out via [email or web address]. Between 7PM-7AM, please contact night-coverage If you still have difficulty reaching the attending provider, please page the [facility] (Director on Call) for [facility] on amion for assistance.

## 2020-06-11 LAB — C-REACTIVE PROTEIN: CRP: 0.5 mg/dL (ref <1.0)

## 2020-06-11 LAB — BASIC METABOLIC PANEL
Anion gap: 11 (ref 5–15)
BUN: 28 mg/dL — ABNORMAL HIGH (ref 8–23)
CO2: 24 mmol/L (ref 22–32)
Calcium: 8.8 mg/dL — ABNORMAL LOW (ref 8.9–10.3)
Chloride: 103 mmol/L (ref 98–111)
Creatinine, Ser: 0.63 mg/dL (ref 0.44–1.00)
GFR calc Af Amer: >60 mL/min (ref >60)
GFR calc non Af Amer: >60 mL/min (ref >60)
Glucose, Bld: 90 mg/dL (ref 70–99)
Potassium: 4 mmol/L (ref 3.5–5.1)
Sodium: 138 mmol/L (ref 135–145)

## 2020-06-11 LAB — CBC WITH DIFFERENTIAL/PLATELET
Abs Immature Granulocytes: 0.09 10*3/uL — ABNORMAL HIGH (ref 0.00–0.07)
Basophils Absolute: 0 10*3/uL (ref 0.0–0.1)
Basophils Relative: 0 %
Eosinophils Absolute: 0 10*3/uL (ref 0.0–0.5)
Eosinophils Relative: 0 %
HCT: 45.4 % (ref 36.0–46.0)
Hemoglobin: 14.7 g/dL (ref 12.0–15.0)
Immature Granulocytes: 1 %
Lymphocytes Relative: 19 %
Lymphs Abs: 1.9 10*3/uL (ref 0.7–4.0)
MCH: 28.5 pg (ref 26.0–34.0)
MCHC: 32.4 g/dL (ref 30.0–36.0)
MCV: 88.2 fL (ref 80.0–100.0)
Monocytes Absolute: 1 10*3/uL (ref 0.1–1.0)
Monocytes Relative: 10 %
Neutro Abs: 7.1 10*3/uL (ref 1.7–7.7)
Neutrophils Relative %: 70 %
Platelets: 374 10*3/uL (ref 150–400)
RBC: 5.15 MIL/uL — ABNORMAL HIGH (ref 3.87–5.11)
RDW: 13.3 % (ref 11.5–15.5)
WBC: 10.2 10*3/uL (ref 4.0–10.5)
nRBC: 0 % (ref 0.0–0.2)

## 2020-06-11 LAB — FERRITIN: Ferritin: 392 ng/mL — ABNORMAL HIGH (ref 11–307)

## 2020-06-11 LAB — PHOSPHORUS: Phosphorus: 4.8 mg/dL — ABNORMAL HIGH (ref 2.5–4.6)

## 2020-06-11 LAB — MAGNESIUM: Magnesium: 2.1 mg/dL (ref 1.7–2.4)

## 2020-06-11 LAB — D-DIMER, QUANTITATIVE: D-Dimer, Quant: 0.34 ug/mL-FEU (ref 0.00–0.50)

## 2020-06-11 MED ORDER — CARVEDILOL 3.125 MG PO TABS
3.1250 mg | ORAL_TABLET | Freq: Two times a day (BID) | ORAL | Status: DC
  Administered 2020-06-12: 3.125 mg via ORAL
  Filled 2020-06-11 (×2): qty 1

## 2020-06-11 MED ORDER — SPIRONOLACTONE 25 MG PO TABS
25.0000 mg | ORAL_TABLET | Freq: Every day | ORAL | Status: DC
  Administered 2020-06-11 – 2020-06-12 (×2): 25 mg via ORAL
  Filled 2020-06-11 (×2): qty 1

## 2020-06-11 MED ORDER — IPRATROPIUM-ALBUTEROL 20-100 MCG/ACT IN AERS
1.0000 | INHALATION_SPRAY | Freq: Two times a day (BID) | RESPIRATORY_TRACT | Status: DC
  Administered 2020-06-11 – 2020-06-12 (×3): 1 via RESPIRATORY_TRACT
  Filled 2020-06-11: qty 4

## 2020-06-11 NOTE — Progress Notes (Signed)
[facility] Progress Note  Patient: Latoya Rios    OTL:[id number]  DOA: 06/08/2020     Date of Service: the patient was seen and examined on 06/11/2020  Brief hospital course: Past medical history of obesity, HTN, chronic CHF presents with complaints of shortness of breath cough and fever found to have COVID-19 pneumonia as well as hypertensive urgency associated with acute on chronic diastolic CHF. Currently plan is continue controlling blood pressure.  Assessment and Plan: 1.  Acute hypoxic respiratory failure secondary to acute on chronic diastolic CHF. Accelerated hypertension. Mildly elevated troponins. Presents with complaints of cough and shortness of breath. Chest x-ray shows congestion. BNP elevated.  Check troponins mildly elevated not consistent with ACS. EKG shows LBBB no prior EKG to compare. Patient denies having any complaints of chest pain, chest tightness, chest heaviness or anginal-like symptoms prior to admission and recent history. Patient denies any prior history of cardiac work-up or cardiac treatment. Family of the patient are not aware of having any prior information about having an LBBB. Patient was started on IV heparin.  Patient will still benefit from outpatient follow-up with cardiology to establish care. Blood pressure still remains elevated.  We will continue Coreg, losartan, Imdur. Continue hydralazine 100 mg 3 times daily as well as hydrochlorothiazide.  Add Aldactone 25 mg daily. Due to bradycardia reduce the dose of Coreg from 6.25 mg twice daily to 3.125 mg twice daily. Tolerating Norvasc without any side effect.  2.  Acute COVID-19 pneumonia. Chest x-ray shows patchy infiltrate as well. Procalcitonin negative. Started on remdesivir and steroids.  Will continue. On room air on exertion.  3.  Obesity Body mass index is 38.91 kg/m.  Placing the patient at high risk for poor outcomes from COVID-19 pneumonia.  4.  Goals of care  conversation Prolonged duration with patient as well as patient's daughter on phone. Patient wanted me to have a goals of care conversation with the daughter. Patient does not want CPR, does not want a ventilator or intubation at any cost. Patient does not want to have be shocked for cardiac arrest but she would like elective cardioversion should she need 1. Patient will be okay with NG tube as well as PEG tube insertion. Patient would be okay with IV fluids and IV antibiotics.  Diet: Continue cardiac diet DVT Prophylaxis: Therapeutic heparin enoxaparin (LOVENOX) injection 40 mg Start: 06/10/20 0900    Advance goals of care discussion: Limited code  Family Communication: no family was present at bedside, at the time of interview.  Discussed with daughter on the phone. The pt provided permission to discuss medical plan with the family. Opportunity was given to ask question and all questions were answered satisfactorily.   Disposition:  Status is: Inpatient  Remains inpatient appropriate because:Inpatient level of care appropriate due to severity of illness   Dispo: The patient is from: Home              Anticipated d/c is to: Home              Anticipated d/c date is: Tomorrow on 06/12/2020.              Patient currently is not medically stable to d/c.  Subjective: Continues to have some shortness of breath.  No nausea no vomiting.  No fever no chills..  Physical Exam:  General: Appear in mild distress, no Rash; Oral Mucosa Clear, moist. no Abnormal Neck Mass Or lumps, Conjunctiva normal  Cardiovascular: S1 and S2 Present, no  Murmur, Respiratory: good respiratory effort, Bilateral Air entry present and CTA, no Crackles, no wheezes Abdomen: Bowel Sound present, Soft and no tenderness Extremities: trace Pedal edema Neurology: alert and oriented to time, place, and person affect appropriate. no new focal deficit Gait not checked due to patient safety concerns  Vitals:    06/11/20 1055 06/11/20 1343 06/11/20 1717 06/11/20 1800  BP: (!) 186/70 (!) 173/70 (!) 173/89   Pulse: (!) 57 62 (!) 53 (!) 52  Resp:  18    Temp:  97.7 F (36.5 C)    TempSrc:  Oral    SpO2: 99% 99% 97%   Weight:      Height:        Intake/Output Summary (Last 24 hours) at 06/11/2020 1854 Last data filed at 06/11/2020 1108 Gross per 24 hour  Intake 100 ml  Output --  Net 100 ml   Filed Weights   06/08/20 0158  Weight: 93.4 kg    Data Reviewed: I have personally reviewed and interpreted daily labs, tele strips, imagings as discussed above. I reviewed all nursing notes, pharmacy notes, vitals, pertinent old records I have discussed plan of care as described above with RN and patient/family.  CBC: Recent Labs  Lab 06/08/20 0225 06/09/20 0412 06/10/20 0533 06/11/20 0548  WBC 11.4* 9.0 13.7* 10.2  NEUTROABS 9.3* 6.9 9.6* 7.1  HGB 14.5 14.5 14.1 14.7  HCT 44.2 45.7 43.4 45.4  MCV 87.2 90.5 88.2 88.2  PLT 300 305 344 374   Basic Metabolic Panel: Recent Labs  Lab 06/08/20 0225 06/09/20 0412 06/10/20 0533 06/11/20 0548  NA 138  --   --  138  K 3.6  --   --  4.0  CL 101  --   --  103  CO2 25  --   --  24  GLUCOSE 109*  --   --  90  BUN 17  --   --  28*  CREATININE 0.71  --   --  0.63  CALCIUM 8.9  --   --  8.8*  MG  --  2.0 1.9 2.1  PHOS  --  3.8 4.4 4.8*    Studies: No results found.  Scheduled Meds: . amLODipine  5 mg Oral Daily  . vitamin C  500 mg Oral Daily  . aspirin EC  81 mg Oral Daily  . carvedilol  3.125 mg Oral BID WC  . dexamethasone  6 mg Oral Q24H  . enoxaparin (LOVENOX) injection  40 mg Subcutaneous Q24H  . hydrALAZINE  100 mg Oral Q8H  . hydrochlorothiazide  25 mg Oral Daily  . Ipratropium-Albuterol  1 puff Inhalation BID  . isosorbide mononitrate  30 mg Oral Daily  . losartan  100 mg Oral Daily  . sodium chloride flush  3 mL Intravenous Q12H  . spironolactone  25 mg Oral Daily  . zinc sulfate  220 mg Oral Daily   Continuous  Infusions: . remdesivir 100 mg in NS 100 mL 100 mg (06/11/20 1108)   PRN Meds: acetaminophen, chlorpheniramine-HYDROcodone, guaiFENesin-dextromethorphan, ondansetron **OR** ondansetron (ZOFRAN) IV, senna-docusate, sodium chloride  Time spent: 35 minutes  Author: Lynden Oxford, MD [facility] 06/11/2020 6:54 PM  To reach On-call, see care teams to locate the attending and reach out via [email or web address]. Between 7PM-7AM, please contact night-coverage If you still have difficulty reaching the attending provider, please page the [facility] (Director on Call) for [facility] on amion for assistance.

## 2020-06-12 ENCOUNTER — Other Ambulatory Visit: Payer: Self-pay | Admitting: Internal Medicine

## 2020-06-12 LAB — CBC WITH DIFFERENTIAL/PLATELET
Abs Immature Granulocytes: 0.09 10*3/uL — ABNORMAL HIGH (ref 0.00–0.07)
Basophils Absolute: 0 10*3/uL (ref 0.0–0.1)
Basophils Relative: 0 %
Eosinophils Absolute: 0 10*3/uL (ref 0.0–0.5)
Eosinophils Relative: 0 %
HCT: 52.3 % — ABNORMAL HIGH (ref 36.0–46.0)
Hemoglobin: 17 g/dL — ABNORMAL HIGH (ref 12.0–15.0)
Immature Granulocytes: 1 %
Lymphocytes Relative: 21 %
Lymphs Abs: 2.3 10*3/uL (ref 0.7–4.0)
MCH: 28.3 pg (ref 26.0–34.0)
MCHC: 32.5 g/dL (ref 30.0–36.0)
MCV: 87.2 fL (ref 80.0–100.0)
Monocytes Absolute: 1.2 10*3/uL — ABNORMAL HIGH (ref 0.1–1.0)
Monocytes Relative: 11 %
Neutro Abs: 7.6 10*3/uL (ref 1.7–7.7)
Neutrophils Relative %: 67 %
Platelets: 446 10*3/uL — ABNORMAL HIGH (ref 150–400)
RBC: 6 MIL/uL — ABNORMAL HIGH (ref 3.87–5.11)
RDW: 13.2 % (ref 11.5–15.5)
WBC: 11.3 10*3/uL — ABNORMAL HIGH (ref 4.0–10.5)
nRBC: 0 % (ref 0.0–0.2)

## 2020-06-12 LAB — COMPREHENSIVE METABOLIC PANEL
ALT: 24 U/L (ref 0–44)
AST: 23 U/L (ref 15–41)
Albumin: 3.7 g/dL (ref 3.5–5.0)
Alkaline Phosphatase: 69 U/L (ref 38–126)
Anion gap: 13 (ref 5–15)
BUN: 27 mg/dL — ABNORMAL HIGH (ref 8–23)
CO2: 24 mmol/L (ref 22–32)
Calcium: 9.2 mg/dL (ref 8.9–10.3)
Chloride: 97 mmol/L — ABNORMAL LOW (ref 98–111)
Creatinine, Ser: 0.68 mg/dL (ref 0.44–1.00)
GFR calc Af Amer: >60 mL/min (ref >60)
GFR calc non Af Amer: >60 mL/min (ref >60)
Glucose, Bld: 100 mg/dL — ABNORMAL HIGH (ref 70–99)
Potassium: 4.1 mmol/L (ref 3.5–5.1)
Sodium: 134 mmol/L — ABNORMAL LOW (ref 135–145)
Total Bilirubin: 0.5 mg/dL (ref 0.3–1.2)
Total Protein: 7.7 g/dL (ref 6.5–8.1)

## 2020-06-12 LAB — PHOSPHORUS: Phosphorus: 4.6 mg/dL (ref 2.5–4.6)

## 2020-06-12 LAB — C-REACTIVE PROTEIN: CRP: 0.5 mg/dL (ref <1.0)

## 2020-06-12 LAB — MAGNESIUM: Magnesium: 2.2 mg/dL (ref 1.7–2.4)

## 2020-06-12 LAB — FERRITIN: Ferritin: 496 ng/mL — ABNORMAL HIGH (ref 11–307)

## 2020-06-12 LAB — D-DIMER, QUANTITATIVE: D-Dimer, Quant: <0.27 ug/mL-FEU (ref 0.00–0.50)

## 2020-06-12 MED ORDER — DEXAMETHASONE 6 MG PO TABS: 6.0000 mg | ORAL_TABLET | Freq: Every day | ORAL | 0 refills | Status: AC

## 2020-06-12 MED ORDER — ISOSORBIDE MONONITRATE ER 30 MG PO TB24
30.0000 mg | ORAL_TABLET | Freq: Every day | ORAL | 0 refills | Status: DC
Start: 2020-06-13 — End: 2020-06-25

## 2020-06-12 MED ORDER — SPIRONOLACTONE 25 MG PO TABS
25.0000 mg | ORAL_TABLET | Freq: Every day | ORAL | 0 refills | Status: DC
Start: 2020-06-13 — End: 2020-06-22

## 2020-06-12 MED ORDER — LOSARTAN POTASSIUM-HCTZ 100-25 MG PO TABS: 1.0000 | ORAL_TABLET | Freq: Every day | ORAL | 0 refills | Status: DC

## 2020-06-12 MED ORDER — ZINC SULFATE 220 (50 ZN) MG PO CAPS
220.0000 mg | ORAL_CAPSULE | Freq: Every day | ORAL | 0 refills | Status: DC
Start: 2020-06-13 — End: 2020-09-04

## 2020-06-12 MED ORDER — ASPIRIN 81 MG PO TBEC
81.0000 mg | DELAYED_RELEASE_TABLET | Freq: Every day | ORAL | 11 refills | Status: DC
Start: 2020-06-13 — End: 2022-09-04

## 2020-06-12 MED ORDER — AMLODIPINE BESYLATE 5 MG PO TABS
5.0000 mg | ORAL_TABLET | Freq: Every day | ORAL | 0 refills | Status: DC
Start: 2020-06-13 — End: 2020-06-25

## 2020-06-12 MED ORDER — ASCORBIC ACID 500 MG PO TABS
500.0000 mg | ORAL_TABLET | Freq: Every day | ORAL | 0 refills | Status: DC
Start: 2020-06-13 — End: 2020-09-04

## 2020-06-12 MED ORDER — CARVEDILOL 3.125 MG PO TABS
3.1250 mg | ORAL_TABLET | Freq: Two times a day (BID) | ORAL | 0 refills | Status: DC
Start: 2020-06-12 — End: 2020-06-25

## 2020-06-12 MED ORDER — HYDRALAZINE HCL 100 MG PO TABS
100.0000 mg | ORAL_TABLET | Freq: Three times a day (TID) | ORAL | 0 refills | Status: DC
Start: 2020-06-12 — End: 2020-06-22

## 2020-06-12 MED FILL — DEXAMETHASONE 6 MG TABLET: 6 | 5 days supply | Qty: 5 | Fill #0

## 2020-06-12 MED FILL — CARVEDILOL 3.125 MG TABLET: 3.125 | 30 days supply | Qty: 60 | Fill #0

## 2020-06-12 MED FILL — AMLODIPINE BESYLATE 5 MG TA: 5 | 30 days supply | Qty: 30 | Fill #0

## 2020-06-12 MED FILL — ISOSORBIDE MONONITRATE ER 3: 30 | 30 days supply | Qty: 30 | Fill #0

## 2020-06-12 MED FILL — LOSARTAN-HCTZ 100-25 MG TAB: 100-25 | 30 days supply | Qty: 30 | Fill #0

## 2020-06-12 MED FILL — SPIRONOLACTONE 25 MG TABS: 25 | 30 days supply | Qty: 30 | Fill #0

## 2020-06-12 MED FILL — hydrALAZINE HCL 100 MG TABS: 100 | 30 days supply | Qty: 90 | Fill #0

## 2020-06-12 NOTE — Plan of Care (Signed)

## 2020-06-12 NOTE — Discharge Instructions (Signed)
10 Things You Can Do to Manage Your COVID-19 Symptoms at Home If you have possible or confirmed COVID-19: 1. Stay home from work and school. And stay away from other public places. If you must go out, avoid using any kind of public transportation, ridesharing, or taxis. 2. Monitor your symptoms carefully. If your symptoms get worse, call your healthcare provider immediately. 3. Get rest and stay hydrated. 4. If you have a medical appointment, call the healthcare provider ahead of time and tell them that you have or may have COVID-19. 5. For medical emergencies, call 911 and notify the dispatch personnel that you have or may have COVID-19. 6. Cover your cough and sneezes with a tissue or use the inside of your elbow. 7. Wash your hands often with soap and water for at least 20 seconds or clean your hands with an alcohol-based hand sanitizer that contains at least 60% alcohol. 8. As much as possible, stay in a specific room and away from other people in your home. Also, you should use a separate bathroom, if available. If you need to be around other people in or outside of the home, wear a mask. 9. Avoid sharing personal items with other people in your household, like dishes, towels, and bedding. 10. Clean all surfaces that are touched often, like counters, tabletops, and doorknobs. Use household cleaning sprays or wipes according to the label instructions. [email or web address] 03/17/2019 This information is not intended to replace advice given to you by your health care provider. Make sure you discuss any questions you have with your health care provider. Document Revised: 08/19/2019 Document Reviewed: 08/19/2019 Elsevier Patient Education  [place].   COVID-19 Frequently Asked Questions COVID-19 (coronavirus disease) is an infection that is caused by a large family of viruses. Some viruses cause illness in people and others cause illness in animals like camels, cats, and bats. In some  cases, the viruses that cause illness in animals can spread to humans. Where did the coronavirus come from? In December 2019, [place] told the [facility]) of several cases of lung disease (human respiratory illness). These cases were linked to an open seafood and livestock market in the city of [place]. The link to the seafood and livestock market suggests that the virus may have spread from animals to humans. However, since that first outbreak in December, the virus has also been shown to spread from person to person. What is the name of the disease and the virus? Disease name Early on, this disease was called novel coronavirus. This is because scientists determined that the disease was caused by a new (novel) respiratory virus. The World Health Organization [facility] has now named the disease COVID-19, or coronavirus disease. Virus name The virus that causes the disease is called severe acute respiratory syndrome coronavirus 2 (SARS-CoV-2). More information on disease and virus naming World Health Organization [facility]): www.who.int/emergencies/diseases/novel-coronavirus-2019/technical-guidance/naming-the-coronavirus-disease-(covid-2019)-and-the-virus-that-causes-it Who is at risk for complications from coronavirus disease? Some people may be at higher risk for complications from coronavirus disease. This includes older adults and people who have chronic diseases, such as heart disease, diabetes, and lung disease. If you are at higher risk for complications, take these extra precautions:  Stay home as much as possible.  Avoid social gatherings and travel.  Avoid close contact with others. Stay at least 6 ft (2 m) away from others, if possible.  Wash your hands often with soap and water for at least 20 seconds.  Avoid touching your face, mouth, nose, or eyes.  Keep supplies on hand at home, such as food, medicine, and cleaning supplies.  If you must go out in public, wear a cloth  face covering or face mask. Make sure your mask covers your nose and mouth. How does coronavirus disease spread? The virus that causes coronavirus disease spreads easily from person to person (is contagious). You may catch the virus by:  Breathing in droplets from an infected person. Droplets can be spread by a person breathing, speaking, singing, coughing, or sneezing.  Touching something, like a table or a doorknob, that was exposed to the virus (contaminated) and then touching your mouth, nose, or eyes. Can I get the virus from touching surfaces or objects? There is still a lot that we do not know about the virus that causes coronavirus disease. Scientists are basing a lot of information on what they know about similar viruses, such as:  Viruses cannot generally survive on surfaces for long. They need a human body (host) to survive.  It is more likely that the virus is spread by close contact with people who are sick (direct contact), such as through: ? Shaking hands or hugging. ? Breathing in respiratory droplets that travel through the air. Droplets can be spread by a person breathing, speaking, singing, coughing, or sneezing.  It is less likely that the virus is spread when a person touches a surface or object that has the virus on it (indirect contact). The virus may be able to enter the body if the person touches a surface or object and then touches his or her face, eyes, nose, or mouth. Can a person spread the virus without having symptoms of the disease? It may be possible for the virus to spread before a person has symptoms of the disease, but this is most likely not the main way the virus is spreading. It is more likely for the virus to spread by being in close contact with people who are sick and breathing in the respiratory droplets spread by a person breathing, speaking, singing, coughing, or sneezing. What are the symptoms of coronavirus disease? Symptoms vary from person to  person and can range from mild to severe. Symptoms may include:  Fever or chills.  Cough.  Difficulty breathing or feeling short of breath.  Headaches, body aches, or muscle aches.  Runny or stuffy (congested) nose.  Sore throat.  New loss of taste or smell.  Nausea, vomiting, or diarrhea. These symptoms can appear anywhere from 2 to 14 days after you have been exposed to the virus. Some people may not have any symptoms. If you develop symptoms, call your health care provider. People with severe symptoms may need hospital care. Should I be tested for this virus? Your health care provider will decide whether to test you based on your symptoms, history of exposure, and your risk factors. How does a health care provider test for this virus? Health care providers will collect samples to send for testing. Samples may include:  Taking a swab of fluid from the back of your nose and throat, your nose, or your throat.  Taking fluid from the lungs by having you cough up mucus (sputum) into a sterile cup.  Taking a blood sample. Is there a treatment or vaccine for this virus? Currently, there is no vaccine to prevent coronavirus disease. Also, there are no medicines like antibiotics or antivirals to treat the virus. A person who becomes sick is given supportive care, which means rest and fluids. A person may also  relieve his or her symptoms by using over-the-counter medicines that treat sneezing, coughing, and runny nose. These are the same medicines that a person takes for the common cold. If you develop symptoms, call your health care provider. People with severe symptoms may need hospital care. What can I do to protect myself and my family from this virus?     You can protect yourself and your family by taking the same actions that you would take to prevent the spread of other viruses. Take the following actions:  Wash your hands often with soap and water for at least 20 seconds. If soap  and water are not available, use alcohol-based hand sanitizer.  Avoid touching your face, mouth, nose, or eyes.  Cough or sneeze into a tissue, sleeve, or elbow. Do not cough or sneeze into your hand or the air. ? If you cough or sneeze into a tissue, throw it away immediately and wash your hands.  Disinfect objects and surfaces that you frequently touch every day.  Stay away from people who are sick.  Avoid going out in public, follow guidance from your state and local health authorities.  Avoid crowded indoor spaces. Stay at least 6 ft (2 m) away from others.  If you must go out in public, wear a cloth face covering or face mask. Make sure your mask covers your nose and mouth.  Stay home if you are sick, except to get medical care. Call your health care provider before you get medical care. Your health care provider will tell you how long to stay home.  Make sure your vaccines are up to date. Ask your health care provider what vaccines you need. What should I do if I need to travel? Follow travel recommendations from your local health authority, the CDC, and WHO. Travel information and advice  Centers for Disease Control and Prevention (CDC): www.cdc.gov/coronavirus/2019-ncov/travelers/index.html  World Health Organization (WHO): www.who.int/emergencies/diseases/novel-coronavirus-2019/travel-advice Know the risks and take action to protect your health  You are at higher risk of getting coronavirus disease if you are traveling to areas with an outbreak or if you are exposed to travelers from areas with an outbreak.  Wash your hands often and practice good hygiene to lower the risk of catching or spreading the virus. What should I do if I am sick? General instructions to stop the spread of infection  Wash your hands often with soap and water for at least 20 seconds. If soap and water are not available, use alcohol-based hand sanitizer.  Cough or sneeze into a tissue, sleeve, or  elbow. Do not cough or sneeze into your hand or the air.  If you cough or sneeze into a tissue, throw it away immediately and wash your hands.  Stay home unless you must get medical care. Call your health care provider or local health authority before you get medical care.  Avoid public areas. Do not take public transportation, if possible.  If you can, wear a mask if you must go out of the house or if you are in close contact with someone who is not sick. Make sure your mask covers your nose and mouth. Keep your home clean  Disinfect objects and surfaces that are frequently touched every day. This may include: ? Counters and tables. ? Doorknobs and light switches. ? Sinks and faucets. ? Electronics such as phones, remote controls, keyboards, computers, and tablets.  Wash dishes in hot, soapy water or use a dishwasher. Air-dry your dishes.  Wash laundry in   hot water. Prevent infecting other household members  Let healthy household members care for children and pets, if possible. If you have to care for children or pets, wash your hands often and wear a mask.  Sleep in a different bedroom or bed, if possible.  Do not share personal items, such as razors, toothbrushes, deodorant, combs, brushes, towels, and washcloths. Where to find more information Centers for Disease Control and Prevention (CDC)  Information and news updates: www.cdc.gov/coronavirus/2019-ncov World Health Organization (WHO)  Information and news updates: www.who.int/emergencies/diseases/novel-coronavirus-2019  Coronavirus health topic: www.who.int/health-topics/coronavirus  Questions and answers on COVID-19: www.who.int/news-room/q-a-detail/q-a-coronaviruses  Global tracker: who.sprinklr.com American Academy of Pediatrics (AAP)  Information for families: www.healthychildren.org/English/health-issues/conditions/chest-lungs/Pages/2019-Novel-Coronavirus.aspx The coronavirus situation is changing rapidly. Check  your local health authority website or the CDC and WHO websites for updates and news. When should I contact a health care provider?  Contact your health care provider if you have symptoms of an infection, such as fever or cough, and you: ? Have been near anyone who is known to have coronavirus disease. ? Have come into contact with a person who is suspected to have coronavirus disease. ? Have traveled to an area where there is an outbreak of COVID-19. When should I get emergency medical care?  Get help right away by calling your local emergency services (911 in the [place] if you have: ? Trouble breathing. ? Pain or pressure in your chest. ? Confusion. ? Blue-tinged lips and fingernails. ? Difficulty waking from sleep. ? Symptoms that get worse. Let the emergency medical personnel know if you think you have coronavirus disease. Summary  A new respiratory virus is spreading from person to person and causing COVID-19 (coronavirus disease).  The virus that causes COVID-19 appears to spread easily. It spreads from one person to another through droplets from breathing, speaking, singing, coughing, or sneezing.  Older adults and those with chronic diseases are at higher risk of disease. If you are at higher risk for complications, take extra precautions.  There is currently no vaccine to prevent coronavirus disease. There are no medicines, such as antibiotics or antivirals, to treat the virus.  You can protect yourself and your family by washing your hands often, avoiding touching your face, and covering your coughs and sneezes. This information is not intended to replace advice given to you by your health care provider. Make sure you discuss any questions you have with your health care provider. Document Revised: 07/02/2019 Document Reviewed: 12/29/2018 Elsevier Patient Education  2020 Elsevier Inc.  COVID-19: How to Protect Yourself and Others Know how it spreads  There is currently no  vaccine to prevent coronavirus disease 2019 (COVID-19).  The best way to prevent illness is to avoid being exposed to this virus.  The virus is thought to spread mainly from person-to-person. ? Between people who are in close contact with one another (within about 6 feet). ? Through respiratory droplets produced when an infected person coughs, sneezes or talks. ? These droplets can land in the mouths or noses of people who are nearby or possibly be inhaled into the lungs. ? COVID-19 may be spread by people who are not showing symptoms. Everyone should Clean your hands often  Wash your hands often with soap and water for at least 20 seconds especially after you have been in a public place, or after blowing your nose, coughing, or sneezing.  If soap and water are not readily available, use a hand sanitizer that contains at least 60% alcohol. Cover all surfaces of   your hands and rub them together until they feel dry.  Avoid touching your eyes, nose, and mouth with unwashed hands. Avoid close contact  Limit contact with others as much as possible.  Avoid close contact with people who are sick.  Put distance between yourself and other people. ? Remember that some people without symptoms may be able to spread virus. ? This is especially important for people who are at higher risk of getting very [email or web address] Cover your mouth and nose with a mask when around others  You could spread COVID-19 to others even if you do not feel sick.  Everyone should wear a mask in public settings and when around people not living in their household, especially when social distancing is difficult to maintain. ? Masks should not be placed on young children under age 38, anyone who has trouble breathing, or is unconscious, incapacitated or otherwise unable to remove the mask without assistance.  The mask is meant to protect other people  in case you are infected.  Do NOT use a facemask meant for a Dietitian.  Continue to keep about 6 feet between yourself and others. The mask is not a substitute for social distancing. Cover coughs and sneezes  Always cover your mouth and nose with a tissue when you cough or sneeze or use the inside of your elbow.  Throw used tissues in the trash.  Immediately wash your hands with soap and water for at least 20 seconds. If soap and water are not readily available, clean your hands with a hand sanitizer that contains at least 60% alcohol. Clean and disinfect  Clean AND disinfect frequently touched surfaces daily. This includes tables, doorknobs, light switches, countertops, handles, desks, phones, keyboards, toilets, faucets, and sinks. [email or web address]  If surfaces are dirty, clean them: Use detergent or soap and water prior to disinfection.  Then, use a household disinfectant. You can see a list of EPA-registered household disinfectants here. [email or web address] 05/19/2019 This information is not intended to replace advice given to you by your health care provider. Make sure you discuss any questions you have with your health care provider. Document Revised: 05/27/2019 Document Reviewed: 03/25/2019 Elsevier Patient Education  [employer] [place] [employer]: Quarantine vs. Isolation QUARANTINE keeps someone who was in close contact with someone who has COVID-19 away from others. If you had close contact with a person who has COVID-19  Stay home until 14 days after your last contact.  Check your temperature twice a day and watch for symptoms of COVID-19.  If possible, stay away from people who are at higher-risk for getting very sick from COVID-19. ISOLATION keeps someone who is sick or tested positive for COVID-19 without symptoms away from others, even in their own home. If you are sick and think or know you  have COVID-19  Stay home until after ? At least 10 days since symptoms first appeared and ? At least 24 hours with no fever without fever-reducing medication and ? Symptoms have improved If you tested positive for COVID-19 but do not have symptoms  Stay home until after ? 10 days have passed since your positive test If you live with others, stay in a specific "sick room" or area and away from other people or animals, including pets. Use a separate bathroom, if available. [email or web address] 04/05/2019 This information is not intended to replace advice given to you by your health care provider. Make sure you discuss any questions you have with your health care provider. Document Revised: 08/19/2019  Document Reviewed: 08/19/2019 Elsevier Patient Education  [employer].

## 2020-06-12 NOTE — Progress Notes (Signed)
Chaplain responded to page from Geographical information systems officer.  Patient requested a living will. Chaplain delivered AD document to secretary and it was conveyed to patient. Patient Covid positive. Chaplain called patient and she contacted her daughter and the three had a lengthy conversation.  Patient is divorced and lives alone. Her daughter is a Therapist, music.  Chaplain walked through AD with them and explained that getting a notary and witnesses was difficult during this Covid time. The patient is being discharged today and proceeded to make plans to get the document completed and notarized upon hospital release.  Chaplain assured daughter that she is on the FaceSheet as [facility] and that measures would be taken to contact her if her mother were hospitalized again, even if the AD were not yet completed.  Call concluded with daughter making arrangements to pick up mother. Rev. Lynnell Chad Pager [phone]

## 2020-06-12 NOTE — Progress Notes (Signed)
RN advised by this Clinical research associate, CN to retake full set of vitals

## 2020-06-13 LAB — CULTURE, BLOOD (ROUTINE X 2)
Culture: NO GROWTH
Culture: NO GROWTH
Special Requests: ADEQUATE
Special Requests: ADEQUATE

## 2020-06-13 NOTE — Discharge Summary (Signed)
[facility] Discharge Summary   Patient: Latoya Rios YIF:[id number]  PCP: Pcp, No  Date of admission: 06/08/2020   Date of discharge: 06/12/2020     Discharge Diagnoses:   Principal Problem:   Pneumonia due to COVID-19 virus Active Problems:   Acute hypoxemic respiratory failure due to COVID-19 [facility])   Accelerated hypertension   Obesity (BMI 30-39.9)   Demand ischemia (HCC)   Admitted From: Home Disposition:  Home   Recommendations for Outpatient Follow-up:  1. PCP: Follow-up with PCP in 1 week.  Establish care with cardiology as recommended. 2. Follow up LABS/TEST: None   Follow-up Information    [facility]. Schedule an appointment as soon as possible for a visit in 1 month(s).   Specialty: Cardiology Contact information: [address] [place] [address] [phone]       pcp. Schedule an appointment as soon as possible for a visit in 1 week(s).              Diet recommendation: Cardiac diet  Activity: The patient is advised to gradually reintroduce usual activities, as tolerated  Discharge Condition: stable  Code Status: Limited code   History of present illness: As per the H and P dictated on admission, "The patient is a 64 yr old woman who carries a past medical history of morbid obesity and hypertension. She asserts that she "weaned herself off" of coreg as of Christmas eve of last year through a 70 lb weight loss. She states that her symptoms began on 06/06/2020. She was transferred from [facility] to [facility] ED for admission due to respiratory symptoms and positive COVID-19.  At [facility] the patient was found to have very high blood pressures and hypoxia with SaO2 of 92% on room air with complaints of shortness of breath. Her SaO2 increased to the upper nineties with 2 liters.  Lab results demonstrated Troponin of 129 which increased to 194, LDH of 239, CRP of 2.0, Ferritin of 238, Procalcitonin <0.10. CXR  demonstrated mild congestion. EKG demonstrated a left bundle branch block without previous EKG for comparison.  The patient states that she had emesis x 2 at [facility], she denies fevers, chills, diarrhea, hematemesis, hematochezia, coffee ground emesis, and melena. She denies neurological changes, chest pain or pressure, although she does admit to chest tightness.  [facility] have been consulted to admit the patient for further evaluation and treatment."  Hospital Course:  Summary of her active problems in the hospital is as following. 1.  Acute hypoxic respiratory failure secondary to acute on chronic diastolic CHF. Accelerated hypertension. Mildly elevated troponins. Presents with complaints of cough and shortness of breath. Chest x-ray shows congestion. BNP elevated.  Check troponins mildly elevated not consistent with ACS. EKG shows LBBB no prior EKG to compare. Patient denies having any complaints of chest pain, chest tightness, chest heaviness or anginal-like symptoms prior to admission and recent history. Patient denies any prior history of cardiac work-up or cardiac treatment. Family of the patient are not aware of having any prior information about having an LBBB. Patient was started on IV heparin.  Blood pressure still remains elevated.  We will continue Coreg, losartan, Imdur hydralazine 100 mg 3 times daily as well as hydrochlorothiazide.  Add Aldactone 25 mg daily. Tolerating Norvasc without any side effect. Patient will still benefit from outpatient follow-up with cardiology to establish care.  Therefore referral placed.  2.  Acute COVID-19 pneumonia. Chest x-ray shows patchy infiltrate as well. Procalcitonin  negative. Started on remdesivir and steroids.    Completing remdesivir course in the hospital. Continue prednisone taper on discharge. On room air on exertion.  3.  Obesity Body mass index is 38.91 kg/m.  Placing the patient at high risk for poor outcomes  from COVID-19 pneumonia.  4.  Goals of care conversation Prolonged duration with patient as well as patient's daughter on phone. Patient wanted me to have a goals of care conversation with the daughter. Patient does not want CPR, does not want a ventilator or intubation at any cost. Patient does not want to have be shocked for cardiac arrest but she would like elective cardioversion should she need 1. Patient will be okay with NG tube as well as PEG tube insertion. Patient would be okay with IV fluids and IV antibiotics.  Patient was ambulatory without any assistance. On the day of the discharge the patient's vitals were stable, and no other acute medical condition were reported by patient. The patient was felt safe to be discharge at Home with no therapy needed on discharge.  Consultants: none Procedures: Echocardiogram   Discharge Exam: General: Appear in no distress, no Rash; Oral Mucosa Clear, moist. no Abnormal Neck Mass Or lumps, Conjunctiva normal  Cardiovascular: S1 and S2 Present, no Murmur Respiratory: good respiratory effort, Bilateral Air entry present and CTA, no Crackles, no wheezes Abdomen: Bowel Sound present, Soft and no tenderness Extremities: no Pedal edema Neurology: alert and oriented to time, place, and person affect appropriate. no new focal deficit  Filed Weights   06/08/20 0158  Weight: 93.4 kg   Vitals:   06/12/20 1000 06/12/20 1100  BP:    Pulse:    Resp:    Temp:    SpO2: 95% 98%    DISCHARGE MEDICATION: Allergies as of 06/12/2020      Reactions   Clonidine    Nisoldipine    Penicillins    Quinacrine    Quinapril       Medication List    TAKE these medications   amLODipine 5 MG tablet Commonly known as: NORVASC Take 1 tablet (5 mg total) by mouth daily.   ascorbic acid 500 MG tablet Commonly known as: VITAMIN C Take 1 tablet (500 mg total) by mouth daily.   aspirin 81 MG EC tablet Take 1 tablet (81 mg total) by mouth daily.  Swallow whole.   dexamethasone 6 MG tablet Commonly known as: DECADRON Take 1 tablet (6 mg total) by mouth daily for 5 days.   hydrALAZINE 100 MG tablet Commonly known as: APRESOLINE Take 1 tablet (100 mg total) by mouth 3 (three) times daily.   isosorbide mononitrate 30 MG 24 hr tablet Commonly known as: IMDUR Take 1 tablet (30 mg total) by mouth daily.   losartan-hydrochlorothiazide 100-25 MG tablet Commonly known as: HYZAAR Take 1 tablet by mouth daily.   spironolactone 25 MG tablet Commonly known as: ALDACTONE Take 1 tablet (25 mg total) by mouth daily.   zinc sulfate 220 (50 Zn) MG capsule Take 1 capsule (220 mg total) by mouth daily.      Allergies  Allergen Reactions  . Clonidine   . Nisoldipine   . Penicillins   . Quinacrine   . Quinapril    Discharge Instructions    Ambulatory referral to Cardiology   Complete by: As directed    Diet - low sodium heart healthy   Complete by: As directed    Discharge instructions   Complete by: As directed  It is important that you read the given instructions as well as go over your medication list with RN to help you understand your care after this hospitalization.  Please follow-up with PCP in 1-2 weeks.  Please note that NO REFILLS for any discharge medications will be authorized once you are discharged, as it is imperative that you return to your primary care physician (or establish a relationship with a primary care physician if you do not have one) for your aftercare needs so that they can reassess your need for medications and monitor your lab values.  Please request your primary care physician to go over all Hospital Tests and Procedure/Radiological results at the follow up. Please get all Hospital records sent to your PCP by signing hospital release before you go home.    Do not take more than prescribed Pain, Sleep and Anxiety Medications.  You were cared for by a hospitalist during your hospital stay. If you  have any questions about your discharge medications or the care you received while you were in the hospital after you are discharged, you can call the unit @ you were admitted to and ask to speak with the hospitalist Lynden Oxford. Ask for Hospitalist on call if the hospitalist that took care of you is not available.   Once you are discharged, your primary care physician will handle any further medical issues.  You Must read complete instructions/literature along with all the possible adverse reactions/side effects for all the Medicines you take and that have been prescribed to you. Take any new Medicines after you have completely understood and accept all the possible adverse reactions/side effects.  if you have smoked or chewed Tobacco in the last 2 yrs please STOP smoking If you drink alcohol, please safely reduce the use. Do not drive, operating heavy machinery, perform activities at heights, swimming or participation in water activities or provide baby sitting services under influence.  Wear Seat belts while driving.   Increase activity slowly   Complete by: As directed       The results of significant diagnostics from this hospitalization (including imaging, microbiology, ancillary and laboratory) are listed below for reference.    Significant Diagnostic Studies: DG Chest Portable 1 View  Result Date: 06/08/2020 CLINICAL DATA:  Shortness of breath and cough for 2 days EXAM: PORTABLE CHEST 1 VIEW COMPARISON:  None. FINDINGS: Cardiac shadow is within normal limits. Mild vascular congestion is noted centrally without interstitial edema. No focal infiltrate is noted. No bony abnormality is seen. IMPRESSION: Mild congestive failure without significant edema. Electronically Signed   By: Alcide Clever M.D.   On: 06/08/2020 02:02   ECHOCARDIOGRAM COMPLETE  Result Date: 06/09/2020    ECHOCARDIOGRAM REPORT   Patient Name:   LEEAN AMEZCUA Date of Exam: 06/09/2020 Medical Rec #:  [id number]      Height:       61.0 in Accession #:    [id number]    Weight:       205.9 lb Date of Birth:  Feb 05, 1956      BSA:          1.912 m Patient Age:    64 years      BP:           162/70 mmHg Patient Gender: F             HR:           67 bpm. Exam Location:  Inpatient Procedure: 2D Echo Indications:  elevated troponin  History:        Patient has no prior history of Echocardiogram examinations.                 CHF; Risk Factors:Hypertension and Dyslipidemia.  Sonographer:    Celene Skeen RDCS (AE) Referring Phys: [id number] [facility] IMPRESSIONS  1. Left ventricular ejection fraction, by estimation, is 60 to 65%. The left ventricle has normal function. The left ventricle has no regional wall motion abnormalities. Left ventricular diastolic parameters are consistent with Grade I diastolic dysfunction (impaired relaxation).  2. Right ventricular systolic function is normal. The right ventricular size is normal. Tricuspid regurgitation signal is inadequate for assessing PA pressure.  3. The mitral valve is grossly normal. Mild mitral valve regurgitation. No evidence of mitral stenosis.  4. The aortic valve is grossly normal. Aortic valve regurgitation is not visualized. No aortic stenosis is present.  5. The inferior vena cava is normal in size with <50% respiratory variability, suggesting right atrial pressure of 8 mmHg. FINDINGS  Left Ventricle: Left ventricular ejection fraction, by estimation, is 60 to 65%. The left ventricle has normal function. The left ventricle has no regional wall motion abnormalities. The left ventricular internal cavity size was normal in size. There is  no left ventricular hypertrophy. Left ventricular diastolic parameters are consistent with Grade I diastolic dysfunction (impaired relaxation). Right Ventricle: The right ventricular size is normal. No increase in right ventricular wall thickness. Right ventricular systolic function is normal. Tricuspid regurgitation signal is inadequate for  assessing PA pressure. Left Atrium: Left atrial size was normal in size. Right Atrium: Right atrial size was normal in size. Pericardium: Trivial pericardial effusion is present. Presence of pericardial fat pad. Mitral Valve: The mitral valve is grossly normal. Mild mitral valve regurgitation. No evidence of mitral valve stenosis. Tricuspid Valve: The tricuspid valve is grossly normal. Tricuspid valve regurgitation is trivial. No evidence of tricuspid stenosis. Aortic Valve: The aortic valve is grossly normal. Aortic valve regurgitation is not visualized. No aortic stenosis is present. Pulmonic Valve: The pulmonic valve was grossly normal. Pulmonic valve regurgitation is not visualized. No evidence of pulmonic stenosis. Aorta: The aortic root is normal in size and structure. Venous: The inferior vena cava is normal in size with less than 50% respiratory variability, suggesting right atrial pressure of 8 mmHg. IAS/Shunts: The atrial septum is grossly normal.  LEFT VENTRICLE PLAX 2D LVIDd:         5.30 cm  Diastology LVIDs:         3.30 cm  LV e' lateral:   7.72 cm/s LV PW:         1.00 cm  LV E/e' lateral: 12.5 LV IVS:        1.10 cm LVOT diam:     1.70 cm LV SV:         49 LV SV Index:   26 LVOT Area:     2.27 cm  RIGHT VENTRICLE RV S prime:     15.20 cm/s TAPSE (M-mode): 2.1 cm LEFT ATRIUM             Index       RIGHT ATRIUM           Index LA diam:        3.40 cm 1.78 cm/m  RA Area:     21.60 cm LA Vol (A2C):   47.0 ml 24.58 ml/m RA Volume:   61.80 ml  32.32 ml/m LA Vol (A4C):  52.5 ml 27.45 ml/m LA Biplane Vol: 49.7 ml 25.99 ml/m  AORTIC VALVE LVOT Vmax:   101.00 cm/s LVOT Vmean:  75.300 cm/s LVOT VTI:    0.216 m  AORTA Ao Root diam: 2.50 cm MITRAL VALVE MV Area (PHT): 1.74 cm     SHUNTS MV Decel Time: 437 msec     Systemic VTI:  0.22 m MV E velocity: 96.40 cm/s   Systemic Diam: 1.70 cm MV A velocity: 105.00 cm/s MV E/A ratio:  0.92 Lennie Odor MD Electronically signed by Lennie Odor MD Signature  Date/Time: 06/09/2020/5:47:58 PM    Final     Microbiology: Recent Results (from the past 240 hour(s))  Blood Culture (routine x 2)     Status: None   Collection Time: 06/08/20  2:11 AM   Specimen: BLOOD RIGHT ARM  Result Value Ref Range Status   Specimen Description   Final    BLOOD RIGHT ARM Performed at [facility], [address] [place], [place] [address]    Special Requests   Final    BOTTLES DRAWN AEROBIC AND ANAEROBIC Blood Culture adequate volume Performed at [facility], [address] [place], [place] [address]    Culture   Final    NO GROWTH 5 DAYS Performed at [facility], [address], [place], [place] [address]    Report Status 06/13/2020 FINAL  Final  Blood Culture (routine x 2)     Status: None   Collection Time: 06/08/20  2:16 AM   Specimen: BLOOD LEFT ARM  Result Value Ref Range Status   Specimen Description   Final    BLOOD LEFT ARM Performed at [facility], [address] [place], [place] [address]    Special Requests   Final    BOTTLES DRAWN AEROBIC AND ANAEROBIC Blood Culture adequate volume Performed at [facility], [address] [place], [place] [address]    Culture   Final    NO GROWTH 5 DAYS Performed at [facility], [address], [place], [place] [address]    Report Status 06/13/2020 FINAL  Final  SARS Coronavirus 2 by RT PCR (hospital order, performed in [facility] hospital lab) Nasopharyngeal Nasopharyngeal Swab     Status: Abnormal   Collection Time: 06/08/20  2:50 AM   Specimen: Nasopharyngeal Swab  Result Value Ref Range Status   SARS Coronavirus 2 POSITIVE (A) NEGATIVE Final    Comment: RESULT CALLED TO, READ BACK BY AND VERIFIED WITH: NEAL,K AT 0414 ON [id number] BY CHERESNOWSKY,T (NOTE) SARS-CoV-2 target nucleic acids are DETECTED  SARS-CoV-2 RNA is generally detectable in upper respiratory specimens  during the acute phase of infection.  Positive results are  indicative  of the presence of the identified virus, but do not rule out bacterial infection or co-infection with other pathogens not detected by the test.  Clinical correlation with patient history and  other diagnostic information is necessary to determine patient infection status.  The expected result is negative.  Fact Sheet for Patients:   BoilerBrush.com.cy   Fact Sheet for Healthcare Providers:   https://pope.com/    This test is not yet approved or cleared by the [place] FDA and  has been authorized for detection and/or diagnosis of SARS-CoV-2 by FDA under an Emergency Use Authorization (EUA).  This EUA will remain in effect (meaning  this test can be used) for the duration of  the COVID-19 declaration  under Section 564(b)(1) of the Act, 21 U.S.C. section 360-bbb-3(b)(1), unless the authorization is terminated or revoked sooner.  Performed at [facility], [address] [place], [place] [address]      Labs: CBC: Recent Labs  Lab 06/08/20 0225 06/09/20 0412 06/10/20 0533 06/11/20 0548 06/12/20 0457  WBC 11.4* 9.0 13.7* 10.2 11.3*  NEUTROABS 9.3* 6.9 9.6* 7.1 7.6  HGB 14.5 14.5 14.1 14.7 17.0*  HCT 44.2 45.7 43.4 45.4 52.3*  MCV 87.2 90.5 88.2 88.2 87.2  PLT 300 305 344 374 446*   Basic Metabolic Panel: Recent Labs  Lab 06/08/20 0225 06/09/20 0412 06/10/20 0533 06/11/20 0548 06/12/20 0457  NA 138  --   --  138 134*  K 3.6  --   --  4.0 4.1  CL 101  --   --  103 97*  CO2 25  --   --  24 24  GLUCOSE 109*  --   --  90 100*  BUN 17  --   --  28* 27*  CREATININE 0.71  --   --  0.63 0.68  CALCIUM 8.9  --   --  8.8* 9.2  MG  --  2.0 1.9 2.1 2.2  PHOS  --  3.8 4.4 4.8* 4.6   Liver Function Tests: Recent Labs  Lab 06/08/20 0225 06/12/20 0457  AST 26 23  ALT 30 24  ALKPHOS 95 69  BILITOT 0.5 0.5  PROT 7.5 7.7  ALBUMIN 3.6 3.7   CBG: No results for input(s): GLUCAP in the last 168  hours.  Time spent: 35 minutes  Signed:  Lynden Oxfordranav Mekiah Wahler  [facility] 06/12/2020 9:33 PM

## 2020-06-15 ENCOUNTER — Ambulatory Visit: Payer: BC Managed Care – PPO | Admitting: Cardiology

## 2020-06-20 DIAGNOSIS — R011 Cardiac murmur, unspecified: Secondary | ICD-10-CM | POA: Insufficient documentation

## 2020-06-20 DIAGNOSIS — I10 Essential (primary) hypertension: Secondary | ICD-10-CM | POA: Insufficient documentation

## 2020-06-20 DIAGNOSIS — E669 Obesity, unspecified: Secondary | ICD-10-CM | POA: Insufficient documentation

## 2020-06-20 DIAGNOSIS — I509 Heart failure, unspecified: Secondary | ICD-10-CM | POA: Insufficient documentation

## 2020-06-22 ENCOUNTER — Encounter: Payer: Self-pay | Admitting: Cardiology

## 2020-06-22 ENCOUNTER — Other Ambulatory Visit: Payer: Self-pay

## 2020-06-22 ENCOUNTER — Ambulatory Visit (INDEPENDENT_AMBULATORY_CARE_PROVIDER_SITE_OTHER): Payer: BC Managed Care – PPO | Admitting: Cardiology

## 2020-06-22 VITALS — BP 83/52

## 2020-06-22 DIAGNOSIS — E669 Obesity, unspecified: Secondary | ICD-10-CM

## 2020-06-22 DIAGNOSIS — I952 Hypotension due to drugs: Secondary | ICD-10-CM

## 2020-06-22 DIAGNOSIS — I10 Essential (primary) hypertension: Secondary | ICD-10-CM | POA: Diagnosis not present

## 2020-06-22 HISTORY — DX: Hypotension due to drugs: I95.2

## 2020-06-22 MED ORDER — HYDRALAZINE HCL 50 MG PO TABS: 50.0000 mg | ORAL_TABLET | Freq: Three times a day (TID) | ORAL | 3 refills | Status: DC

## 2020-06-22 MED FILL — hydrALAZINE HCL 50 MG TABS: 50 | 90 days supply | Qty: 270 | Fill #0

## 2020-06-22 NOTE — Patient Instructions (Signed)
Medication Instructions:  Your physician has recommended you make the following change in your medication:  STOP: Aldactone START: Hydralazine 50 mg take one tablet by mouth every 8 hours.  *If you need a refill on your cardiac medications before your next appointment, please call your pharmacy*   Lab Work: Your physician recommends that you return for lab work in: TODAY BMP, CBC, Magnesium If you have labs (blood work) drawn today and your tests are completely normal, you will receive your results only by: Marland Kitchen MyChart Message (if you have MyChart) OR . A paper copy in the mail If you have any lab test that is abnormal or we need to change your treatment, we will call you to review the results.   Testing/Procedures: None   Follow-Up: At [facility], you and your health needs are our priority.  As part of our continuing mission to provide you with exceptional heart care, we have created designated Provider Care Teams.  These Care Teams include your primary Cardiologist (physician) and Advanced Practice Providers (APPs -  Physician Assistants and Nurse Practitioners) who all work together to provide you with the care you need, when you need it.  We recommend signing up for the patient portal called "MyChart".  Sign up information is provided on this After Visit Summary.  MyChart is used to connect with patients for Virtual Visits (Telemedicine).  Patients are able to view lab/test results, encounter notes, upcoming appointments, etc.  Non-urgent messages can be sent to your provider as well.   To learn more about what you can do with MyChart, go to [email or web address].    Your next appointment:   1 month(s)  The format for your next appointment:   In Person  Provider:   Thomasene Ripple, DO   Other Instructions

## 2020-06-22 NOTE — Progress Notes (Signed)
Cardiology Office Note:    Date:  06/22/2020   ID:  [name], DOB [birth year], MRN [id number]  PCP:  Pcp, No  Cardiologist:  Berniece Salines, DO  Electrophysiologist:  None   Referring MD: Lavina Hamman, MD   ' I Have been having a lot of low blood pressures"  History of Present Illness:    Latoya Rios is a 64 y.o. female with a hx of hypertension, hyperlipidemia, recent Covid infection was treated at the [facility] and then transition to the [facility] emergency department where she was hospitalized at [facility] for acute respiratory failure due to COVID-19.  During her hospitalization she did have elevated troponin she was treated medically.  Due to accelerated hypertension the patient placed on multiple antihypertensives.  And was advised to follow-up with cardiology in the outpatient.  The patient is here today with her daughter for follow-up visit.  She tells me that she had recovered but the biggest problem she has at home is the fact that her blood pressure is dropping constantly below 100s.  He did bring her blood pressure record with her and shows that she is usually in the low 100s but there are multiple times where her blood pressure is low 100.  They are both concerned about this as the patient does have significant lightheadedness when this happened.  She denies any chest pain.  Past Medical History:  Diagnosis Date  . Abnormal renal function 03/01/2019   Formatting of this note is different from the original. eGFR  >=60.0 mL/min/1.44m2 >60.0  31.2Low  CM  >60.0 CM     Last Assessment & Plan:  Formatting of this note might be different from the original. Resolved as of last labs Rechecking labs to ensure stability  . Accelerated hypertension 06/08/2020  . Acute hypoxemic respiratory failure due to COVID-19 ([facility] 06/08/2020  . Benign essential hypertension 05/29/2016   Last Assessment & Plan:  Formatting of this note is different from the original. Pertinent  Data:   Current medication includes: carvediloL - 3.125 mg losartan-hydroCHLOROthiazide - 100-25 mg .  BP Readings from Last 3 Encounters:  02/29/20 (!) 206/86  08/31/19 (!) 142/78  08/16/19 (!) 160/56   LDL Calculated (mg/dL)  Date Value  08/31/2019 144 (H)  03/01/2019 156 (H)   Creatinine (mg/dL)  Date Val  . CHF (congestive heart failure) ([facility]   . Class 2 severe obesity due to excess calories with serious comorbidity and body mass index (BMI) of 37.0 to 37.9 in adult ([facility] 05/29/2016   Formatting of this note is different from the original. Wt Readings from Last 3 Encounters:  08/31/19 233 lb  08/16/19 239 lb  03/01/19 249 lb    Last Assessment & Plan:  Formatting of this note might be different from the original. BMI Assessment: Current Body mass index is 37.22 kg/m.  Patient BMI currently is above average (>25 kg/m2); BMI follow up plan is completed . Current barriers to heal  . Demand ischemia ([facility] 06/08/2020  . Drug declined by patient 02/29/2020   Last Assessment & Plan:  Formatting of this note might be different from the original. Patient historically has declined statins and at some point was on losartan/HCTZ and carvedilol, but stopped taking them stating " I dont want to take medications ".  Today I again discussed with patient the risks of uncontrolled HYPERLIPIDEMIA and HYPERTENSION including but not limited to strokes, MIs, renal in  . Encounter for preventative adult health care  exam with abnormal findings 06/12/2017   Last Assessment & Plan:  Formatting of this note might be different from the original. Patient for wellness visit.  Overall doing good. No new concerns. Reviewed appropriate age screenings and vaccinations.  Influenza vaccine: Patient Declined Tdap/TD: Patient Declined Zoster vaccine: Patient Declined  Colonoscopy: UTD, due 2022 Mammogram: UTD  Depression screening: negative No falls reported  Dis  . Gynecologic exam normal 08/16/2019   Last Assessment & Plan:  Formatting of  this note might be different from the original. Pap smear with high risk hpv collected for screening Discussed and encouraged healthy lifestyle.  Always use sunscreen when sun exposed.  Encouraged healthy diet with fresh fruits, vegetables, and lean meat.  Encouraged regular exercise for heart health and bone health.  Encouraged continue monthly SBE and yearl  . Heart murmur   . Hypertension   . Mixed hyperlipidemia   . Obese   . Obesity (BMI 30-39.9) 06/08/2020  . Pneumonia due to COVID-19 virus 06/08/2020    Past Surgical History:  Procedure Laterality Date  . COLONOSCOPY    . DENTAL SURGERY      Current Medications: Current Meds  Medication Sig  . amLODipine (NORVASC) 5 MG tablet Take 1 tablet (5 mg total) by mouth daily.  Marland Kitchen ascorbic acid (VITAMIN C) 500 MG tablet Take 1 tablet (500 mg total) by mouth daily.  Marland Kitchen aspirin EC 81 MG EC tablet Take 1 tablet (81 mg total) by mouth daily. Swallow whole.  . carvedilol (COREG) 3.125 MG tablet Take 1 tablet (3.125 mg total) by mouth 2 (two) times daily with a meal. (Patient taking differently: Take 3.125 mg by mouth 2 (two) times daily with a meal. )  . isosorbide mononitrate (IMDUR) 30 MG 24 hr tablet Take 1 tablet (30 mg total) by mouth daily.  Marland Kitchen losartan-hydrochlorothiazide (HYZAAR) 100-25 MG tablet Take 1 tablet by mouth daily.  Marland Kitchen zinc sulfate 220 (50 Zn) MG capsule Take 1 capsule (220 mg total) by mouth daily.  . [DISCONTINUED] hydrALAZINE (APRESOLINE) 100 MG tablet Take 1 tablet (100 mg total) by mouth 3 (three) times daily.  . [DISCONTINUED] spironolactone (ALDACTONE) 25 MG tablet Take 1 tablet (25 mg total) by mouth daily.     Allergies:   Clonidine, Nisoldipine, Penicillins, Quinacrine, and Quinapril   Social History   Socioeconomic History  . Marital status: Widowed    Spouse name: Not on file  . Number of children: Not on file  . Years of education: Not on file  . Highest education level: Not on file  Occupational History  .  Not on file  Tobacco Use  . Smoking status: Never Smoker  . Smokeless tobacco: Never Used  Substance and Sexual Activity  . Alcohol use: Never  . Drug use: Never  . Sexual activity: Not on file  Other Topics Concern  . Not on file  Social History Narrative  . Not on file   Social Determinants of Health   Financial Resource Strain:   . Difficulty of Paying Living Expenses: Not on file  Food Insecurity:   . Worried About Charity fundraiser in the Last Year: Not on file  . Ran Out of Food in the Last Year: Not on file  Transportation Needs:   . Lack of Transportation (Medical): Not on file  . Lack of Transportation (Non-Medical): Not on file  Physical Activity:   . Days of Exercise per Week: Not on file  . Minutes of Exercise per Session:  Not on file  Stress:   . Feeling of Stress : Not on file  Social Connections:   . Frequency of Communication with Friends and Family: Not on file  . Frequency of Social Gatherings with Friends and Family: Not on file  . Attends Religious Services: Not on file  . Active Member of Clubs or Organizations: Not on file  . Attends Archivist Meetings: Not on file  . Marital Status: Not on file     Family History: The patient's family history includes Brain cancer in her father; Emphysema in her mother; Lung disease in her mother.  ROS:   Review of Systems  Constitution: Negative for decreased appetite, fever and weight gain.  HENT: Negative for congestion, ear discharge, hoarse voice and sore throat.   Eyes: Negative for discharge, redness, vision loss in right eye and visual halos.  Cardiovascular: Negative for chest pain, dyspnea on exertion, leg swelling, orthopnea and palpitations.  Respiratory: Negative for cough, hemoptysis, shortness of breath and snoring.   Endocrine: Negative for heat intolerance and polyphagia.  Hematologic/Lymphatic: Negative for bleeding problem. Does not bruise/bleed easily.  Skin: Negative for  flushing, nail changes, rash and suspicious lesions.  Musculoskeletal: Negative for arthritis, joint pain, muscle cramps, myalgias, neck pain and stiffness.  Gastrointestinal: Negative for abdominal pain, bowel incontinence, diarrhea and excessive appetite.  Genitourinary: Negative for decreased libido, genital sores and incomplete emptying.  Neurological: Negative for brief paralysis, focal weakness, headaches and loss of balance.  Psychiatric/Behavioral: Negative for altered mental status, depression and suicidal ideas.  Allergic/Immunologic: Negative for HIV exposure and persistent infections.    EKGs/Labs/Other Studies Reviewed:    The following studies were reviewed today:   EKG:  The ekg ordered today demonstrates sinus rhythm, heart rate 76 bpm.  Echocardiogram  IMPRESSIONS June 10, 2020 1. Left ventricular ejection fraction, by estimation, is 60 to 65%. The left ventricle has normal function. The left ventricle has no regional wall motion abnormalities. Left ventricular diastolic parameters are consistent with Grade I diastolic  dysfunction (impaired relaxation).  2. Right ventricular systolic function is normal. The right ventricular size is normal. Tricuspid regurgitation signal is inadequate for assessing PA pressure.  3. The mitral valve is grossly normal. Mild mitral valve regurgitation. No evidence of mitral stenosis.  4. The aortic valve is grossly normal. Aortic valve regurgitation is not visualized. No aortic stenosis is present.  5. The inferior vena cava is normal in size with <50% respiratory variability, suggesting right atrial pressure of 8 mmHg.      Recent Labs: 06/08/2020: B Natriuretic Peptide 297.3 06/12/2020: ALT 24; BUN 27; Creatinine, Ser 0.68; Hemoglobin 17.0; Magnesium 2.2; Platelets 446; Potassium 4.1; Sodium 134  Recent Lipid Panel    Component Value Date/Time   TRIG 84 06/08/2020 0225    Physical Exam:    VS:  BP (!) 83/52 (BP Location:  Left Arm)     Wt Readings from Last 3 Encounters:  06/08/20 205 lb 14.6 oz (93.4 kg)  01/02/20 206 lb (93.4 kg)     GEN: Well nourished, well developed in no acute distress HEENT: Normal NECK: No JVD; No carotid bruits LYMPHATICS: No lymphadenopathy CARDIAC: S1S2 noted,RRR, no murmurs, rubs, gallops RESPIRATORY:  Clear to auscultation without rales, wheezing or rhonchi  ABDOMEN: Soft, non-tender, non-distended, +bowel sounds, no guarding. EXTREMITIES: No edema, No cyanosis, no clubbing MUSCULOSKELETAL:  No deformity  SKIN: Warm and dry NEUROLOGIC:  Alert and oriented x 3, non-focal PSYCHIATRIC:  Normal affect, good insight  ASSESSMENT:    1. Hypotension due to drugs   2. History of hypertension    3. Obesity (BMI 30-39.9)    PLAN:     She has hypotensive in the office today.  I did review the patient medication regimen with her.  I am going to stop her Aldactone.  Decrease her hydralazine to 50 mg daily.  For now she will stay on her carvedilol at 3.125, losartan-hydrochlorothiazide combination 100-25 mg.  There will take her blood pressure at home.  Follow the patient in 1 month.  She has no angina symptoms and pertaining to her elevated troponin when she was in the hospital.  She denies any chest pain, shortness of breath.  She was treated medically we will continue to monitor the patient and the need to reassess for any further ischemic evaluation.  I did review her echocardiogram which was done during this time which does not show any wall motion abnormality with a normal EF.  Blood work will be done today which include BMP, mag and CBC. The patient wanted to share with me her goals of care and she tells me is really important to her as she really wants to talk about her future hospitalizations:Patient does not want CPR, does not want a ventilator or intubation at any cost.Patient does not want to have be shocked for cardiac arrest but she would like elective cardioversion  should she need 1.Patient will be okay with NG tube as well as PEG tube insertion.Patient would be okay with IV fluids and IV antibiotics.  She plans to complete all of this paperwork with her primary care provider.  The patient is in agreement with the above plan. The patient left the office in stable condition.  The patient will follow up in 1 month or sooner if needed.   Medication Adjustments/Labs and Tests Ordered: Current medicines are reviewed at length with the patient today.  Concerns regarding medicines are outlined above.  Orders Placed This Encounter  Procedures  . Basic metabolic panel  . Magnesium  . CBC  . EKG 12-Lead   Meds ordered this encounter  Medications  . hydrALAZINE (APRESOLINE) 50 MG tablet    Sig: Take 1 tablet (50 mg total) by mouth 3 (three) times daily.    Dispense:  270 tablet    Refill:  3    Patient Instructions  Medication Instructions:  Your physician has recommended you make the following change in your medication:  STOP: Aldactone START: Hydralazine 50 mg take one tablet by mouth every 8 hours.  *If you need a refill on your cardiac medications before your next appointment, please call your pharmacy*   Lab Work: Your physician recommends that you return for lab work in: TODAY BMP, CBC, Magnesium If you have labs (blood work) drawn today and your tests are completely normal, you will receive your results only by: Marland Kitchen MyChart Message (if you have MyChart) OR . A paper copy in the mail If you have any lab test that is abnormal or we need to change your treatment, we will call you to review the results.   Testing/Procedures: None   Follow-Up: At [facility], you and your health needs are our priority.  As part of our continuing mission to provide you with exceptional heart care, we have created designated Provider Care Teams.  These Care Teams include your primary Cardiologist (physician) and Advanced Practice Providers (APPs -  Physician  Assistants and Nurse Practitioners) who all work together to provide  you with the care you need, when you need it.  We recommend signing up for the patient portal called "MyChart".  Sign up information is provided on this After Visit Summary.  MyChart is used to connect with patients for Virtual Visits (Telemedicine).  Patients are able to view lab/test results, encounter notes, upcoming appointments, etc.  Non-urgent messages can be sent to your provider as well.   To learn more about what you can do with MyChart, go to [email or web address].    Your next appointment:   1 month(s)  The format for your next appointment:   In Person  Provider:   Berniece Salines, DO   Other Instructions      Adopting a Healthy Lifestyle.  Know what a healthy weight is for you (roughly BMI <25) and aim to maintain this   Aim for 7+ servings of fruits and vegetables daily   65-80+ fluid ounces of water or unsweet tea for healthy kidneys   Limit to max 1 drink of alcohol per day; avoid smoking/tobacco   Limit animal fats in diet for cholesterol and heart health - choose grass fed whenever available   Avoid highly processed foods, and foods high in saturated/trans fats   Aim for low stress - take time to unwind and care for your mental health   Aim for 150 min of moderate intensity exercise weekly for heart health, and weights twice weekly for bone health   Aim for 7-9 hours of sleep daily   When it comes to diets, agreement about the perfect plan isnt easy to find, even among the experts. Experts at the [employer] developed an idea known as the Healthy Eating Plate. Just imagine a plate divided into logical, healthy portions.   The emphasis is on diet quality:   Load up on vegetables and fruits - one-half of your plate: Aim for color and variety, and remember that potatoes dont count.   Go for whole grains - one-quarter of your plate: Whole wheat, barley, wheat berries,  quinoa, oats, brown rice, and foods made with them. If you want pasta, go with whole wheat pasta.   Protein power - one-quarter of your plate: Fish, chicken, beans, and nuts are all healthy, versatile protein sources. Limit red meat.   The diet, however, does go beyond the plate, offering a few other suggestions.   Use healthy plant oils, such as olive, canola, soy, corn, sunflower and peanut. Check the labels, and avoid partially hydrogenated oil, which have unhealthy trans fats.   If youre thirsty, drink water. Coffee and tea are good in moderation, but skip sugary drinks and limit milk and dairy products to one or two daily servings.   The type of carbohydrate in the diet is more important than the amount. Some sources of carbohydrates, such as vegetables, fruits, whole grains, and beans-are healthier than others.   Finally, stay active  Signed, Berniece Salines, DO  06/22/2020 6:29 PM    [place]

## 2020-06-23 ENCOUNTER — Telehealth: Payer: Self-pay

## 2020-06-23 ENCOUNTER — Other Ambulatory Visit: Payer: Self-pay

## 2020-06-23 ENCOUNTER — Emergency Department (HOSPITAL_BASED_OUTPATIENT_CLINIC_OR_DEPARTMENT_OTHER): Payer: BC Managed Care – PPO

## 2020-06-23 ENCOUNTER — Encounter (HOSPITAL_BASED_OUTPATIENT_CLINIC_OR_DEPARTMENT_OTHER): Payer: Self-pay | Admitting: *Deleted

## 2020-06-23 ENCOUNTER — Inpatient Hospital Stay (HOSPITAL_BASED_OUTPATIENT_CLINIC_OR_DEPARTMENT_OTHER)
Admission: EM | Admit: 2020-06-23 | Discharge: 2020-06-25 | DRG: 683 | Disposition: A | Discharge status: Home / Self Care | Payer: BC Managed Care – PPO | Attending: Internal Medicine | Admitting: Internal Medicine

## 2020-06-23 DIAGNOSIS — I110 Hypertensive heart disease with heart failure: Secondary | ICD-10-CM | POA: Diagnosis present

## 2020-06-23 DIAGNOSIS — Z8616 Personal history of COVID-19: Secondary | ICD-10-CM

## 2020-06-23 DIAGNOSIS — Z66 Do not resuscitate: Secondary | ICD-10-CM | POA: Diagnosis present

## 2020-06-23 DIAGNOSIS — N3000 Acute cystitis without hematuria: Secondary | ICD-10-CM

## 2020-06-23 DIAGNOSIS — Z6833 Body mass index (BMI) 33.0-33.9, adult: Secondary | ICD-10-CM

## 2020-06-23 DIAGNOSIS — Z79899 Other long term (current) drug therapy: Secondary | ICD-10-CM

## 2020-06-23 DIAGNOSIS — Z808 Family history of malignant neoplasm of other organs or systems: Secondary | ICD-10-CM

## 2020-06-23 DIAGNOSIS — Y92239 Unspecified place in hospital as the place of occurrence of the external cause: Secondary | ICD-10-CM | POA: Diagnosis present

## 2020-06-23 DIAGNOSIS — I509 Heart failure, unspecified: Secondary | ICD-10-CM | POA: Diagnosis present

## 2020-06-23 DIAGNOSIS — E669 Obesity, unspecified: Secondary | ICD-10-CM | POA: Diagnosis present

## 2020-06-23 DIAGNOSIS — R0902 Hypoxemia: Secondary | ICD-10-CM

## 2020-06-23 DIAGNOSIS — I10 Essential (primary) hypertension: Secondary | ICD-10-CM | POA: Diagnosis present

## 2020-06-23 DIAGNOSIS — N179 Acute kidney failure, unspecified: Principal | ICD-10-CM | POA: Diagnosis present

## 2020-06-23 DIAGNOSIS — E871 Hypo-osmolality and hyponatremia: Secondary | ICD-10-CM | POA: Diagnosis present

## 2020-06-23 DIAGNOSIS — Z825 Family history of asthma and other chronic lower respiratory diseases: Secondary | ICD-10-CM

## 2020-06-23 DIAGNOSIS — Z7982 Long term (current) use of aspirin: Secondary | ICD-10-CM

## 2020-06-23 DIAGNOSIS — E782 Mixed hyperlipidemia: Secondary | ICD-10-CM | POA: Diagnosis present

## 2020-06-23 DIAGNOSIS — T502X5A Adverse effect of carbonic-anhydrase inhibitors, benzothiadiazides and other diuretics, initial encounter: Secondary | ICD-10-CM | POA: Diagnosis present

## 2020-06-23 HISTORY — DX: Acute kidney failure, unspecified: N17.9

## 2020-06-23 LAB — URINALYSIS, ROUTINE W REFLEX MICROSCOPIC
Bilirubin Urine: NEGATIVE
Glucose, UA: NEGATIVE mg/dL
Hgb urine dipstick: NEGATIVE
Ketones, ur: NEGATIVE mg/dL
Nitrite: POSITIVE — AB
Protein, ur: NEGATIVE mg/dL
Specific Gravity, Urine: 1.01 (ref 1.005–1.030)
pH: 5.5 (ref 5.0–8.0)

## 2020-06-23 LAB — CBC WITH DIFFERENTIAL/PLATELET
Abs Immature Granulocytes: 0.12 10*3/uL — ABNORMAL HIGH (ref 0.00–0.07)
Basophils Absolute: 0 10*3/uL (ref 0.0–0.1)
Basophils Relative: 0 %
Eosinophils Absolute: 0.4 10*3/uL (ref 0.0–0.5)
Eosinophils Relative: 3 %
HCT: 43.1 % (ref 36.0–46.0)
Hemoglobin: 14.9 g/dL (ref 12.0–15.0)
Immature Granulocytes: 1 %
Lymphocytes Relative: 8 %
Lymphs Abs: 1.4 10*3/uL (ref 0.7–4.0)
MCH: 28.6 pg (ref 26.0–34.0)
MCHC: 34.6 g/dL (ref 30.0–36.0)
MCV: 82.7 fL (ref 80.0–100.0)
Monocytes Absolute: 2.1 10*3/uL — ABNORMAL HIGH (ref 0.1–1.0)
Monocytes Relative: 13 %
Neutro Abs: 12.1 10*3/uL — ABNORMAL HIGH (ref 1.7–7.7)
Neutrophils Relative %: 75 %
Platelets: 391 10*3/uL (ref 150–400)
RBC: 5.21 MIL/uL — ABNORMAL HIGH (ref 3.87–5.11)
RDW: 13.2 % (ref 11.5–15.5)
WBC: 16.1 10*3/uL — ABNORMAL HIGH (ref 4.0–10.5)
nRBC: 0 % (ref 0.0–0.2)

## 2020-06-23 LAB — CBC
Hematocrit: 46.8 % — ABNORMAL HIGH (ref 34.0–46.6)
Hemoglobin: 15.4 g/dL (ref 11.1–15.9)
MCH: 27.8 pg (ref 26.6–33.0)
MCHC: 32.9 g/dL (ref 31.5–35.7)
MCV: 85 fL (ref 79–97)
Platelets: 381 10*3/uL (ref 150–450)
RBC: 5.54 x10E6/uL — ABNORMAL HIGH (ref 3.77–5.28)
RDW: 12.9 % (ref 11.7–15.4)
WBC: 21.1 10*3/uL (ref 3.4–10.8)

## 2020-06-23 LAB — COMPREHENSIVE METABOLIC PANEL
ALT: 28 U/L (ref 0–44)
AST: 20 U/L (ref 15–41)
Albumin: 2.5 g/dL — ABNORMAL LOW (ref 3.5–5.0)
Alkaline Phosphatase: 76 U/L (ref 38–126)
Anion gap: 12 (ref 5–15)
BUN: 110 mg/dL — ABNORMAL HIGH (ref 8–23)
CO2: 18 mmol/L — ABNORMAL LOW (ref 22–32)
Calcium: 8.4 mg/dL — ABNORMAL LOW (ref 8.9–10.3)
Chloride: 98 mmol/L (ref 98–111)
Creatinine, Ser: 1.58 mg/dL — ABNORMAL HIGH (ref 0.44–1.00)
GFR, Estimated: 34 mL/min — ABNORMAL LOW (ref >60)
Glucose, Bld: 124 mg/dL — ABNORMAL HIGH (ref 70–99)
Potassium: 3.6 mmol/L (ref 3.5–5.1)
Sodium: 128 mmol/L — ABNORMAL LOW (ref 135–145)
Total Bilirubin: 0.4 mg/dL (ref 0.3–1.2)
Total Protein: 6.9 g/dL (ref 6.5–8.1)

## 2020-06-23 LAB — BASIC METABOLIC PANEL
BUN/Creatinine Ratio: 53 — ABNORMAL HIGH (ref 12–28)
BUN: 78 mg/dL (ref 8–27)
CO2: 17 mmol/L — ABNORMAL LOW (ref 20–29)
Calcium: 9 mg/dL (ref 8.7–10.3)
Chloride: 94 mmol/L — ABNORMAL LOW (ref 96–106)
Creatinine, Ser: 1.47 mg/dL — ABNORMAL HIGH (ref 0.57–1.00)
GFR calc Af Amer: 43 mL/min/{1.73_m2} — ABNORMAL LOW (ref >59)
GFR calc non Af Amer: 37 mL/min/{1.73_m2} — ABNORMAL LOW (ref >59)
Glucose: 112 mg/dL — ABNORMAL HIGH (ref 65–99)
Potassium: 4.7 mmol/L (ref 3.5–5.2)
Sodium: 129 mmol/L — ABNORMAL LOW (ref 134–144)

## 2020-06-23 LAB — MAGNESIUM: Magnesium: 2.5 mg/dL — ABNORMAL HIGH (ref 1.6–2.3)

## 2020-06-23 LAB — URINALYSIS, MICROSCOPIC (REFLEX): WBC, UA: >50 WBC/hpf (ref 0–5)

## 2020-06-23 LAB — TROPONIN I (HIGH SENSITIVITY)
Troponin I (High Sensitivity): 14 ng/L (ref <18)
Troponin I (High Sensitivity): 17 ng/L (ref <18)

## 2020-06-23 LAB — D-DIMER, QUANTITATIVE: D-Dimer, Quant: 0.75 ug/mL-FEU — ABNORMAL HIGH (ref 0.00–0.50)

## 2020-06-23 MED ORDER — ONDANSETRON HCL 4 MG/2ML IJ SOLN: 4.0000 mg | Freq: Four times a day (QID) | INTRAMUSCULAR | Status: DC | PRN

## 2020-06-23 MED ORDER — ALBUTEROL SULFATE HFA 108 (90 BASE) MCG/ACT IN AERS: 2.0000 | INHALATION_SPRAY | RESPIRATORY_TRACT | Status: DC | PRN

## 2020-06-23 MED ORDER — ACETAMINOPHEN 325 MG PO TABS: 650.0000 mg | ORAL_TABLET | ORAL | Status: DC | PRN

## 2020-06-23 MED ORDER — HYDRALAZINE HCL 25 MG PO TABS
50.0000 mg | ORAL_TABLET | Freq: Once | ORAL | Status: AC
  Administered 2020-06-23: 50 mg via ORAL
  Filled 2020-06-23: qty 2

## 2020-06-23 MED ORDER — SODIUM CHLORIDE 0.9 % IV SOLN
1.0000 g | Freq: Once | INTRAVENOUS | Status: AC
  Administered 2020-06-23: 1 g via INTRAVENOUS
  Filled 2020-06-23: qty 10

## 2020-06-23 MED ORDER — POLYETHYLENE GLYCOL 3350 17 G PO PACK
17.0000 g | PACK | Freq: Every day | ORAL | Status: DC | PRN
  Filled 2020-06-23: qty 1

## 2020-06-23 MED ORDER — HYDRALAZINE HCL 20 MG/ML IJ SOLN: 10.0000 mg | Freq: Four times a day (QID) | INTRAMUSCULAR | Status: DC | PRN

## 2020-06-23 MED ORDER — SODIUM CHLORIDE 0.9 % IV SOLN: INTRAVENOUS | Status: DC

## 2020-06-23 MED ORDER — SODIUM CHLORIDE 0.9 % IV BOLUS
1000.0000 mL | Freq: Once | INTRAVENOUS | Status: AC
  Administered 2020-06-23: 1000 mL via INTRAVENOUS

## 2020-06-23 MED ORDER — CARVEDILOL 6.25 MG PO TABS
3.1250 mg | ORAL_TABLET | Freq: Once | ORAL | Status: AC
  Administered 2020-06-23: 3.125 mg via ORAL
  Filled 2020-06-23: qty 1

## 2020-06-23 MED ORDER — MELATONIN 5 MG PO TABS
10.0000 mg | ORAL_TABLET | Freq: Every evening | ORAL | Status: DC | PRN
  Filled 2020-06-23: qty 2

## 2020-06-23 NOTE — Telephone Encounter (Signed)
Spoke to patients daughter regarding results and recommendation.  She  verbalizes understanding and is agreeable to plan of care. Advised patient to call back with any issues or concerns.   They are heading to the ED at this time.

## 2020-06-23 NOTE — ED Notes (Signed)
  Assisted patient to bedside commode to have a bowel movement.  While ambulating, patient dropped her SPO2 from 93-94% to 86-87%.  Patient did not complain of any SOB or chest pain.  Assisted back in bed and patient SPO2 came back up to 94-95% on RA.  Margaux PA notified.

## 2020-06-23 NOTE — ED Triage Notes (Addendum)
C/o SOB, proc cough  today , HX covid last month with admit to  [facility]

## 2020-06-23 NOTE — ED Notes (Signed)
Pt unable to urinate at this time.  

## 2020-06-23 NOTE — ED Provider Notes (Signed)
[facility] EMERGENCY DEPARTMENT Provider Note   CSN: [id number] Arrival date & time: 06/23/20  1639     History Chief Complaint  Patient presents with  . Shortness of Breath    Latoya Rios is a 64 y.o. female with PMHx CHF with preserved EF, demand ischemia, HTN, and recent hospitalization for acute respiratory failure s/2 COVID PNA and acute CHF exacerbation from 09/23-09/27.   Pt presents to the ED today for abnormal labwork as well as fatigue and SOB. She reports that she was started on blood pressure medications during her hospitalization however began having hypotension at home and feeling lightheaded. She went to her cardiologist yesterday for same; she had labwork done at that time and had her aldactone discontinued and her hydralazine decreased to 50 mg daily. She was called today due to AKI with creatinine of 1.47 and an elevated WBC of 21.1. Pt reports she felt fine after being discharged from the hospital however began feeling very fatigued a couple of days ago and then felt short of breath this morning. Pt denies chest pain. Denies fevers, chills, cough, abdominal pain, vomiting, diarrhea, urinary sx, or any other associated symptoms.  The history is provided by the patient and medical records.       Past Medical History:  Diagnosis Date  . Abnormal renal function 03/01/2019   Formatting of this note is different from the original. eGFR  >=60.0 mL/min/1.50m2 >60.0  31.2Low  CM  >60.0 CM     Last Assessment & Plan:  Formatting of this note might be different from the original. Resolved as of last labs Rechecking labs to ensure stability  . Accelerated hypertension 06/08/2020  . Acute hypoxemic respiratory [place] due to [place] 06/08/2020  . Benign essential hypertension 05/29/2016   Last Assessment & Plan:  Formatting of this note is different from the original. Pertinent Data:   Current medication includes: carvediloL - 3.125 mg losartan-hydroCHLOROthiazide  - 100-25 mg .  BP Readings from Last 3 Encounters:  02/29/20 (!) 206/86  08/31/19 (!) 142/78  08/16/19 (!) 160/56   LDL Calculated (mg/dL)  Date Value  08/31/2019 144 (H)  03/01/2019 156 (H)   Creatinine (mg/dL)  Date Val  . CHF (congestive heart failure) ([place]   . Class 2 severe obesity due to excess calories with serious comorbidity and body mass index (BMI) of 37.0 to 37.9 in adult ([facility] 05/29/2016   Formatting of this note is different from the original. Wt Readings from Last 3 Encounters:  08/31/19 233 lb  08/16/19 239 lb  03/01/19 249 lb    Last Assessment & Plan:  Formatting of this note might be different from the original. BMI Assessment: Current Body mass index is 37.22 kg/m.  Patient BMI currently is above average (>25 kg/m2); BMI follow up plan is completed . Current barriers to heal  . Demand ischemia ([place] 06/08/2020  . Drug declined by patient 02/29/2020   Last Assessment & Plan:  Formatting of this note might be different from the original. Patient historically has declined statins and at some point was on losartan/HCTZ and carvedilol, but stopped taking them stating " I dont want to take medications ".  Today I again discussed with patient the risks of uncontrolled HYPERLIPIDEMIA and HYPERTENSION including but not limited to strokes, MIs, renal in  . Encounter for preventative adult health care exam with abnormal findings 06/12/2017   Last Assessment & Plan:  Formatting of this note might be different from the original. Patient  for wellness visit.  Overall doing good. No new concerns. Reviewed appropriate age screenings and vaccinations.  Influenza vaccine: Patient Declined Tdap/TD: Patient Declined Zoster vaccine: Patient Declined  Colonoscopy: UTD, due 2022 Mammogram: UTD  Depression screening: negative No falls reported  Dis  . Gynecologic exam normal 08/16/2019   Last Assessment & Plan:  Formatting of this note might be different from the original. Pap smear with high risk hpv collected  for screening Discussed and encouraged healthy lifestyle.  Always use sunscreen when sun exposed.  Encouraged healthy diet with fresh fruits, vegetables, and lean meat.  Encouraged regular exercise for heart health and bone health.  Encouraged continue monthly SBE and yearl  . Heart murmur   . Hypertension   . Mixed hyperlipidemia   . Obese   . Obesity (BMI 30-39.9) 06/08/2020  . Pneumonia due to COVID-19 virus 06/08/2020    Patient Active Problem List   Diagnosis Date Noted  . Hypotension due to drugs 06/22/2020  . CHF (congestive heart failure) ([place])   . Heart murmur   . Hypertension   . Obese   . Acute hypoxemic respiratory failure due to COVID-19 ([place]) 06/08/2020  . Pneumonia due to COVID-19 virus 06/08/2020  . Accelerated hypertension 06/08/2020  . Obesity (BMI 30-39.9) 06/08/2020  . Demand ischemia ([place]) 06/08/2020  . Drug declined by patient 02/29/2020  . Gynecologic exam normal 08/16/2019  . Abnormal renal function 03/01/2019  . Encounter for preventative adult health care exam with abnormal findings 06/12/2017  . Mixed hyperlipidemia 05/29/2016  . Benign essential hypertension 05/29/2016  . Class 2 severe obesity due to excess calories with serious comorbidity and body mass index (BMI) of 37.0 to 37.9 in adult [facility]) 05/29/2016    Past Surgical History:  Procedure Laterality Date  . COLONOSCOPY    . DENTAL SURGERY       OB History   No obstetric history on file.     Family History  Problem Relation Age of Onset  . Emphysema Mother   . Lung disease Mother   . Brain cancer Father     Social History   Tobacco Use  . Smoking status: Never Smoker  . Smokeless tobacco: Never Used  Substance Use Topics  . Alcohol use: Never  . Drug use: Never    Home Medications Prior to Admission medications   Medication Sig Start Date End Date Taking? Authorizing Provider  amLODipine (NORVASC) 5 MG tablet Take 1 tablet (5 mg total) by mouth daily. 06/13/20   Lavina Hamman, MD  ascorbic acid (VITAMIN C) 500 MG tablet Take 1 tablet (500 mg total) by mouth daily. 06/13/20   Lavina Hamman, MD  aspirin EC 81 MG EC tablet Take 1 tablet (81 mg total) by mouth daily. Swallow whole. 06/13/20   Lavina Hamman, MD  carvedilol (COREG) 3.125 MG tablet Take 1 tablet (3.125 mg total) by mouth 2 (two) times daily with a meal. Patient taking differently: Take 3.125 mg by mouth 2 (two) times daily with a meal.  06/12/20   Lavina Hamman, MD  hydrALAZINE (APRESOLINE) 50 MG tablet Take 1 tablet (50 mg total) by mouth 3 (three) times daily. 06/22/20 09/20/20  Tobb, Kardie, DO  isosorbide mononitrate (IMDUR) 30 MG 24 hr tablet Take 1 tablet (30 mg total) by mouth daily. 06/13/20   Lavina Hamman, MD  losartan-hydrochlorothiazide (HYZAAR) 100-25 MG tablet Take 1 tablet by mouth daily. 06/12/20   Lavina Hamman, MD  zinc sulfate 220 (  50 Zn) MG capsule Take 1 capsule (220 mg total) by mouth daily. 06/13/20   Lavina Hamman, MD    Allergies    Clonidine, Nisoldipine, Penicillins, Quinacrine, and Quinapril  Review of Systems   Review of Systems  Constitutional: Positive for fatigue. Negative for chills and fever.  Respiratory: Positive for shortness of breath. Negative for cough.   Cardiovascular: Negative for chest pain.  Gastrointestinal: Negative for abdominal pain, constipation, diarrhea and vomiting.  Genitourinary: Negative for difficulty urinating, dysuria and flank pain.  All other systems reviewed and are negative.   Physical Exam Updated Vital Signs BP (!) 141/57   Pulse 71   Temp 97.6 F (36.4 C) (Tympanic)   Resp 18   Ht $R'5\' 1"'Bd$  (1.549 m)   Wt 81.2 kg   SpO2 98%   BMI 33.82 kg/m   Physical Exam Vitals and nursing note reviewed.  Constitutional:      Appearance: She is ill-appearing.  HENT:     Head: Normocephalic and atraumatic.  Eyes:     Conjunctiva/sclera: Conjunctivae normal.  Cardiovascular:     Rate and Rhythm: Normal rate and regular rhythm.      Pulses: Normal pulses.  Pulmonary:     Effort: Pulmonary effort is normal.     Breath sounds: Normal breath sounds. No decreased breath sounds, wheezing, rhonchi or rales.  Abdominal:     Palpations: Abdomen is soft.     Tenderness: There is no abdominal tenderness. There is no guarding or rebound.  Musculoskeletal:     Cervical back: Neck supple.     Right lower leg: No edema.     Left lower leg: No edema.  Skin:    General: Skin is warm and dry.  Neurological:     Mental Status: She is alert.     ED Results / Procedures / Treatments   Labs (all labs ordered are listed, but only abnormal results are displayed) Labs Reviewed  COMPREHENSIVE METABOLIC PANEL - Abnormal; Notable for the following components:      Result Value   Sodium 128 (*)    CO2 18 (*)    Glucose, Bld 124 (*)    BUN 110 (*)    Creatinine, Ser 1.58 (*)    Calcium 8.4 (*)    Albumin 2.5 (*)    GFR, Estimated 34 (*)    All other components within normal limits  CBC WITH DIFFERENTIAL/PLATELET - Abnormal; Notable for the following components:   WBC 16.1 (*)    RBC 5.21 (*)    Neutro Abs 12.1 (*)    Monocytes Absolute 2.1 (*)    Abs Immature Granulocytes 0.12 (*)    All other components within normal limits  URINALYSIS, ROUTINE W REFLEX MICROSCOPIC - Abnormal; Notable for the following components:   APPearance CLOUDY (*)    Nitrite POSITIVE (*)    Leukocytes,Ua LARGE (*)    All other components within normal limits  D-DIMER, QUANTITATIVE (NOT AT [facility]) - Abnormal; Notable for the following components:   D-Dimer, Quant 0.75 (*)    All other components within normal limits  URINALYSIS, MICROSCOPIC (REFLEX) - Abnormal; Notable for the following components:   Bacteria, UA MANY (*)    All other components within normal limits  URINE CULTURE  TROPONIN I (HIGH SENSITIVITY)  TROPONIN I (HIGH SENSITIVITY)    EKG EKG Interpretation  Date/Time:  Friday June 23 2020 17:52:51 EDT Ventricular Rate:  62 PR  Interval:    QRS Duration: 106 QT  Interval:  426 QTC Calculation: 433 R Axis:   42 Text Interpretation: Sinus rhythm Minimal ST depression, lateral leads Baseline wander in lead(s) V4 Confirmed by Quintella Reichert [phone]) on 06/23/2020 5:58:26 PM   Radiology US Renal  Result Date: 06/23/2020 CLINICAL DATA:  Acute kidney injury EXAM: RENAL / URINARY TRACT ULTRASOUND COMPLETE COMPARISON:  None. FINDINGS: Right Kidney: Renal measurements: 10.5 x 4.5 x 5.2 cm = volume: 128.4 mL. Echogenicity within normal limits. No mass or hydronephrosis visualized. Left Kidney: Renal measurements: 10.5 x 5.2 x 4.8 cm = volume: 137.9 mL. Echogenicity within normal limits. No mass or hydronephrosis visualized. Bladder: Appears normal for degree of bladder distention. Other: None IMPRESSION: Negative examination Electronically Signed   By: Donavan Foil M.D.   On: 06/23/2020 21:13   DG Chest Port 1 View  Result Date: 06/23/2020 CLINICAL DATA:  Shortness of breath, history of COVID-19 positivity EXAM: PORTABLE CHEST 1 VIEW COMPARISON:  06/08/2020 FINDINGS: Cardiac shadow is within normal limits. The lungs are well aerated bilaterally. Minimal patchy airspace disease is noted in left lung base. The overall inspiratory effort has improved significantly in the interval from the prior exam. No other focal abnormality is noted. IMPRESSION: Significant improved aeration bilaterally. Minimal atelectatic changes are seen in the left base. Electronically Signed   By: Inez Catalina M.D.   On: 06/23/2020 18:48    Procedures Procedures (including critical care time)  Medications Ordered in ED Medications  hydrALAZINE (APRESOLINE) tablet 50 mg (has no administration in time range)  carvedilol (COREG) tablet 3.125 mg (has no administration in time range)  cefTRIAXone (ROCEPHIN) 1 g in sodium chloride 0.9 % 100 mL IVPB (has no administration in time range)  sodium chloride 0.9 % bolus 1,000 mL (1,000 mLs Intravenous New Bag/Given  06/23/20 2025)    ED Course  I have reviewed the triage vital signs and the nursing notes.  Pertinent labs & imaging results that were available during my care of the patient were reviewed by me and considered in my medical decision making (see chart for details).  Clinical Course as of Jun 23 2210  Fri Jun 23, 2020  1839 BUN(!): 110 [MV]  1854 WBC(!): 16.1 [MV]  1854 Creatinine(!): 1.58 [MV]  1918 D-Dimer, Quant(!): 0.75 [MV]    Clinical Course User Index [MV] Eustaquio Maize, PA-C   MDM Rules/Calculators/A&P                          64 year old female who presents to the ED today for abnormal lab work, fatigue, shortness of breath.  Was recently admitted to the hospital for Covid pneumonia and acute respiratory failure.  Daughter cardiologist yesterday to discuss blood pressure management and had lab work done with findings as below.  Was advised to come to the ED for IV hydration.   Glucose 65 - 99 mg/dL 112High   BUN 8 - 27 mg/dL 78High Panic   Comment: **Verified by repeat analysis**  Creatinine, Ser 0.57 - 1.00 mg/dL 1.47High   Comment: **Verified by repeat analysis**  GFR calc non Af Amer >59 mL/min/1.73 37Low   GFR calc Af Amer >59 mL/min/1.73 43Low   Comment: **[facility] currently reports eGFR in compliance with the current**   recommendations of the [employer]. [facility] will   update reporting as new guidelines are published from the NKF-ASN   Task force.   BUN/Creatinine Ratio 12 - 28 53High   Sodium 134 - 144 mmol/L 129Low  Potassium 3.5 - 5.2 mmol/L 4.7   Chloride 96 - 106 mmol/L 94Low   CO2 20 - 29 mmol/L 17Low   Calcium 8.7 - 10.3 mg/dL 9.0     Magnesium 1.6 - 2.3 mg/dL 2.5High     WBC 3.4 - 10.8 x10E3/uL 21.1High Panic   RBC 3.77 - 5.28 x10E6/uL 5.54High   Hemoglobin 11.1 - 15.9 g/dL 15.4   Hematocrit 34.0 - 46.6 % 46.8High   MCV 79 - 97 fL 85   MCH 26.6 - 33.0 pg 27.8   MCHC 31 - 35 g/dL 32.9   RDW 11.7 -  15.4 % 12.9   Platelets 150 - 450 x10E3/uL 381     Patient reports she did not feel short of breath until today causing her concern.  On arrival to the ED patient is afebrile, nontachycardic and nontachypneic.  She does appear fatigued on exam.  Her lungs are clear to auscultation bilaterally.  She is able speak in full sentences without difficulty. She denies any other infectious symptoms including cough, abdominal pain, urinary sx. Will plan for repeat labwork including CBC, CMP, troponin, d dimer, CXR, and EKG. Have discussed case with attending physician Dr. Ralene Bathe who agrees with plan.   CBC with improving WBC at 16.1. Hgb does not appear concentrated at this time.  CMP with sodium 128, creatinine worsening at 1.58 and BUN worsening at 110. Fluids ordered.   Lab Results  Component Value Date   CREATININE 1.58 (H) 06/23/2020   CREATININE 1.47 (H) 06/22/2020   CREATININE 0.68 06/12/2020   CXR with improving infiltrates Troponin of 14. Will repeat.   D dimer has returned elevated at 0.75. Unfortunately with pt's AKI unable to CTA at this time however I do feel she requires admission for AKI. Awaiting U/A at this time however will obtain renal ultrasound to rule out obstructive cause.   Renal ultrasound negative U/A with positive nitrites and leuks and > 50 WBCs per HPF. Will cover for UTI at this time. Suspect this, lack of oral intake at home, and spironolactone pt was placed on are attributing to her AKI. Will call for admission.   Pt requesting BP meds - she missed home dose tonight. Will oblige.   Nursing staff informed that while pt was ambulated to the bathroom she desatted to 88% however quickly went to 96% on RA with rest. She will likely need PE study with admission; unable to do VQ scan or CTA at this time here.   Discussed case with Dr. Marlyce Huge with [facility] who agrees to evaluate for admission. [facility].   This note was prepared using Dragon voice  recognition software and may include unintentional dictation errors due to the inherent limitations of voice recognition software.  Final Clinical Impression(s) / ED Diagnoses Final diagnoses:  AKI (acute kidney injury) ([place])  Acute cystitis without hematuria  Hypoxia    Rx / DC Orders ED Discharge Orders    None       Eustaquio Maize, Hershal Coria 06/23/20 2212    Quintella Reichert, MD 06/23/20 2334

## 2020-06-23 NOTE — Telephone Encounter (Signed)
-----   Message from Thomasene Ripple, DO sent at 06/23/2020 12:23 PM EDT ----- Let the patient know that her blood work showed worsening kidney function as well as significant increase in her white blood count.  I like her to go to the [facility] emergency department to get this repeated as you may need some hydration in terms of the worsening kidney function as it is acute renal injury.

## 2020-06-23 NOTE — ED Notes (Signed)
Family updated as to patient's status.

## 2020-06-23 NOTE — Progress Notes (Signed)
TOC CM referral received referral for DNR paperwork. ED provider updated that Chaplain can assist with Advance Directives. Pt will need to have a Living Will but can updated provider on current Code status while in hospital. Isidoro Donning RN CCM, [facility] ED TOC CM [phone]

## 2020-06-24 ENCOUNTER — Encounter (HOSPITAL_COMMUNITY): Payer: Self-pay | Admitting: Internal Medicine

## 2020-06-24 DIAGNOSIS — E782 Mixed hyperlipidemia: Secondary | ICD-10-CM | POA: Diagnosis present

## 2020-06-24 DIAGNOSIS — N179 Acute kidney failure, unspecified: Principal | ICD-10-CM

## 2020-06-24 DIAGNOSIS — Z66 Do not resuscitate: Secondary | ICD-10-CM | POA: Diagnosis present

## 2020-06-24 DIAGNOSIS — E871 Hypo-osmolality and hyponatremia: Secondary | ICD-10-CM | POA: Diagnosis present

## 2020-06-24 DIAGNOSIS — Z808 Family history of malignant neoplasm of other organs or systems: Secondary | ICD-10-CM | POA: Diagnosis not present

## 2020-06-24 DIAGNOSIS — I110 Hypertensive heart disease with heart failure: Secondary | ICD-10-CM | POA: Diagnosis present

## 2020-06-24 DIAGNOSIS — E669 Obesity, unspecified: Secondary | ICD-10-CM | POA: Diagnosis present

## 2020-06-24 DIAGNOSIS — R0902 Hypoxemia: Secondary | ICD-10-CM | POA: Diagnosis present

## 2020-06-24 DIAGNOSIS — T502X5A Adverse effect of carbonic-anhydrase inhibitors, benzothiadiazides and other diuretics, initial encounter: Secondary | ICD-10-CM | POA: Diagnosis present

## 2020-06-24 DIAGNOSIS — Z7982 Long term (current) use of aspirin: Secondary | ICD-10-CM | POA: Diagnosis not present

## 2020-06-24 DIAGNOSIS — N3000 Acute cystitis without hematuria: Secondary | ICD-10-CM

## 2020-06-24 DIAGNOSIS — Y92239 Unspecified place in hospital as the place of occurrence of the external cause: Secondary | ICD-10-CM | POA: Diagnosis present

## 2020-06-24 DIAGNOSIS — Z79899 Other long term (current) drug therapy: Secondary | ICD-10-CM | POA: Diagnosis not present

## 2020-06-24 DIAGNOSIS — I509 Heart failure, unspecified: Secondary | ICD-10-CM | POA: Diagnosis present

## 2020-06-24 DIAGNOSIS — Z825 Family history of asthma and other chronic lower respiratory diseases: Secondary | ICD-10-CM | POA: Diagnosis not present

## 2020-06-24 DIAGNOSIS — Z8616 Personal history of COVID-19: Secondary | ICD-10-CM | POA: Diagnosis not present

## 2020-06-24 DIAGNOSIS — Z6833 Body mass index (BMI) 33.0-33.9, adult: Secondary | ICD-10-CM | POA: Diagnosis not present

## 2020-06-24 HISTORY — DX: Acute cystitis without hematuria: N30.00

## 2020-06-24 HISTORY — DX: Acute kidney failure, unspecified: N17.9

## 2020-06-24 LAB — MAGNESIUM: Magnesium: 2.6 mg/dL — ABNORMAL HIGH (ref 1.7–2.4)

## 2020-06-24 LAB — CBC WITH DIFFERENTIAL/PLATELET
Abs Immature Granulocytes: 0.09 10*3/uL — ABNORMAL HIGH (ref 0.00–0.07)
Basophils Absolute: 0 10*3/uL (ref 0.0–0.1)
Basophils Relative: 0 %
Eosinophils Absolute: 0.4 10*3/uL (ref 0.0–0.5)
Eosinophils Relative: 3 %
HCT: 43.9 % (ref 36.0–46.0)
Hemoglobin: 14.8 g/dL (ref 12.0–15.0)
Immature Granulocytes: 1 %
Lymphocytes Relative: 8 %
Lymphs Abs: 1 10*3/uL (ref 0.7–4.0)
MCH: 28.6 pg (ref 26.0–34.0)
MCHC: 33.7 g/dL (ref 30.0–36.0)
MCV: 84.9 fL (ref 80.0–100.0)
Monocytes Absolute: 1.8 10*3/uL — ABNORMAL HIGH (ref 0.1–1.0)
Monocytes Relative: 15 %
Neutro Abs: 8.9 10*3/uL — ABNORMAL HIGH (ref 1.7–7.7)
Neutrophils Relative %: 73 %
Platelets: 334 10*3/uL (ref 150–400)
RBC: 5.17 MIL/uL — ABNORMAL HIGH (ref 3.87–5.11)
RDW: 13.3 % (ref 11.5–15.5)
WBC: 12.2 10*3/uL — ABNORMAL HIGH (ref 4.0–10.5)
nRBC: 0 % (ref 0.0–0.2)

## 2020-06-24 LAB — COMPREHENSIVE METABOLIC PANEL
ALT: 24 U/L (ref 0–44)
AST: 18 U/L (ref 15–41)
Albumin: 2.5 g/dL — ABNORMAL LOW (ref 3.5–5.0)
Alkaline Phosphatase: 69 U/L (ref 38–126)
Anion gap: 10 (ref 5–15)
BUN: 86 mg/dL — ABNORMAL HIGH (ref 8–23)
CO2: 16 mmol/L — ABNORMAL LOW (ref 22–32)
Calcium: 8.4 mg/dL — ABNORMAL LOW (ref 8.9–10.3)
Chloride: 106 mmol/L (ref 98–111)
Creatinine, Ser: 0.98 mg/dL (ref 0.44–1.00)
GFR, Estimated: >60 mL/min (ref >60)
Glucose, Bld: 120 mg/dL — ABNORMAL HIGH (ref 70–99)
Potassium: 3.5 mmol/L (ref 3.5–5.1)
Sodium: 132 mmol/L — ABNORMAL LOW (ref 135–145)
Total Bilirubin: 0.5 mg/dL (ref 0.3–1.2)
Total Protein: 6.6 g/dL (ref 6.5–8.1)

## 2020-06-24 LAB — PHOSPHORUS: Phosphorus: 4.4 mg/dL (ref 2.5–4.6)

## 2020-06-24 MED ORDER — LOSARTAN POTASSIUM 50 MG PO TABS: 50.0000 mg | ORAL_TABLET | Freq: Every day | ORAL | Status: DC

## 2020-06-24 MED ORDER — SODIUM CHLORIDE 0.9 % IV SOLN: INTRAVENOUS | Status: DC

## 2020-06-24 MED ORDER — ASPIRIN EC 81 MG PO TBEC
81.0000 mg | DELAYED_RELEASE_TABLET | Freq: Every day | ORAL | Status: DC
  Administered 2020-06-24 – 2020-06-25 (×2): 81 mg via ORAL
  Filled 2020-06-24 (×2): qty 1

## 2020-06-24 MED ORDER — AMLODIPINE BESYLATE 5 MG PO TABS
5.0000 mg | ORAL_TABLET | Freq: Every day | ORAL | Status: DC
  Administered 2020-06-24: 5 mg via ORAL
  Filled 2020-06-24: qty 1

## 2020-06-24 MED ORDER — ACETAMINOPHEN 325 MG PO TABS: 650.0000 mg | ORAL_TABLET | Freq: Four times a day (QID) | ORAL | Status: DC | PRN

## 2020-06-24 MED ORDER — HYDRALAZINE HCL 20 MG/ML IJ SOLN: 10.0000 mg | INTRAMUSCULAR | Status: DC | PRN

## 2020-06-24 MED ORDER — ENOXAPARIN SODIUM 40 MG/0.4ML ~~LOC~~ SOLN
40.0000 mg | SUBCUTANEOUS | Status: DC
  Administered 2020-06-24 – 2020-06-25 (×2): 40 mg via SUBCUTANEOUS
  Filled 2020-06-24 (×2): qty 0.4

## 2020-06-24 MED ORDER — ACETAMINOPHEN 650 MG RE SUPP: 650.0000 mg | Freq: Four times a day (QID) | RECTAL | Status: DC | PRN

## 2020-06-24 MED ORDER — CARVEDILOL 3.125 MG PO TABS
3.1250 mg | ORAL_TABLET | Freq: Two times a day (BID) | ORAL | Status: DC
  Administered 2020-06-24 – 2020-06-25 (×3): 3.125 mg via ORAL
  Filled 2020-06-24 (×3): qty 1

## 2020-06-24 MED ORDER — SODIUM CHLORIDE 0.9 % IV SOLN
1.0000 g | INTRAVENOUS | Status: DC
  Administered 2020-06-24: 1 g via INTRAVENOUS
  Filled 2020-06-24: qty 10

## 2020-06-24 NOTE — H&P (Signed)
History and Physical    Latoya Rios SWN:[id number] DOB: 1956-02-16 DOA: 06/23/2020  PCP: Pcp, No  Patient coming from: Home.  Chief Complaint: Abnormal labs and weakness.  HPI: Latoya Rios is a 64 y.o. female with history of hypertension recently admitted for COVID-19 pneumonia was discharged on June 12, 2020 after being treated for Covid infection and completed a course of steroids at home last dose was around June 17, 2020 had followed up with cardiologist because of the uncontrolled hypertension.  During the admission last 2 weeks ago patient was found to be having hypertensive urgency and was restarted on antihypertensives which patient stopped taking on December 2020 after patient weaned off the medication after she lost weight.  Since discharge patient has been feeling weak and as per the daughter at times hallucinating.  Patient also has been having poor appetite.  When she followed up with her cardiologist her blood pressure was found to be low and her hydralazine dose was decreased by half an spironolactone was discontinued.  Lab work was done and was found to have acute renal failure and leukocytosis and was advised to come to the ER.  Denies any chest pain shortness of breath nausea vomiting or diarrhea.  ED Course: In the ER patient was initially hypotensive with blood pressure systolic in the 93G was given fluid bolus following which blood pressure improved.  Follow if patient actually became more hypotensive was given hydralazine.  Also was given her home dose of Coreg.  EKG shows normal sinus rhythm with heart rate of 162 bpm high sensory troponins were negative.  Chest x-ray shows improved aeration.  UA is concerning for UTI.  Lab work initially showed WBC count of 21.1 creatinine was one-point 4 repeat was 1.5 and sodium was low at 129.  Patient admitted for acute renal failure and blood pressure monitoring.  Leukocytosis could be reactionary but UA shows possibility of UTI  however UA also show some squamous cells.  Review of Systems: As per HPI, rest all negative.   Past Medical History:  Diagnosis Date  . Abnormal renal function 03/01/2019   Formatting of this note is different from the original. eGFR  >=60.0 mL/min/1.71m2 >60.0  31.2Low  CM  >60.0 CM     Last Assessment & Plan:  Formatting of this note might be different from the original. Resolved as of last labs Rechecking labs to ensure stability  . Accelerated hypertension 06/08/2020  . Acute hypoxemic respiratory failure due to COVID-19 ([place] 06/08/2020  . Benign essential hypertension 05/29/2016   Last Assessment & Plan:  Formatting of this note is different from the original. Pertinent Data:   Current medication includes: carvediloL - 3.125 mg losartan-hydroCHLOROthiazide - 100-25 mg .  BP Readings from Last 3 Encounters:  02/29/20 (!) 206/86  08/31/19 (!) 142/78  08/16/19 (!) 160/56   LDL Calculated (mg/dL)  Date Value  08/31/2019 144 (H)  03/01/2019 156 (H)   Creatinine (mg/dL)  Date Val  . CHF (congestive heart failure) ([place]   . Class 2 severe obesity due to excess calories with serious comorbidity and body mass index (BMI) of 37.0 to 37.9 in adult ([facility] 05/29/2016   Formatting of this note is different from the original. Wt Readings from Last 3 Encounters:  08/31/19 233 lb  08/16/19 239 lb  03/01/19 249 lb    Last Assessment & Plan:  Formatting of this note might be different from the original. BMI Assessment: Current Body mass index is 37.22 kg/m.  Patient BMI currently is above average (>25 kg/m2); BMI follow up plan is completed . Current barriers to heal  . Demand ischemia ([place]) 06/08/2020  . Drug declined by patient 02/29/2020   Last Assessment & Plan:  Formatting of this note might be different from the original. Patient historically has declined statins and at some point was on losartan/HCTZ and carvedilol, but stopped taking them stating " I dont want to take medications ".  Today I again discussed  with patient the risks of uncontrolled HYPERLIPIDEMIA and HYPERTENSION including but not limited to strokes, MIs, renal in  . Encounter for preventative adult health care exam with abnormal findings 06/12/2017   Last Assessment & Plan:  Formatting of this note might be different from the original. Patient for wellness visit.  Overall doing good. No new concerns. Reviewed appropriate age screenings and vaccinations.  Influenza vaccine: Patient Declined Tdap/TD: Patient Declined Zoster vaccine: Patient Declined  Colonoscopy: UTD, due 2022 Mammogram: UTD  Depression screening: negative No falls reported  Dis  . Gynecologic exam normal 08/16/2019   Last Assessment & Plan:  Formatting of this note might be different from the original. Pap smear with high risk hpv collected for screening Discussed and encouraged healthy lifestyle.  Always use sunscreen when sun exposed.  Encouraged healthy diet with fresh fruits, vegetables, and lean meat.  Encouraged regular exercise for heart health and bone health.  Encouraged continue monthly SBE and yearl  . Heart murmur   . Hypertension   . Mixed hyperlipidemia   . Obese   . Obesity (BMI 30-39.9) 06/08/2020  . Pneumonia due to COVID-19 virus 06/08/2020    Past Surgical History:  Procedure Laterality Date  . COLONOSCOPY    . DENTAL SURGERY       reports that she has never smoked. She has never used smokeless tobacco. She reports that she does not drink alcohol and does not use drugs.  Allergies  Allergen Reactions  . Clonidine   . Nisoldipine   . Penicillins   . Quinacrine   . Quinapril     Family History  Problem Relation Age of Onset  . Emphysema Mother   . Lung disease Mother   . Brain cancer Father     Prior to Admission medications   Medication Sig Start Date End Date Taking? Authorizing Provider  amLODipine (NORVASC) 5 MG tablet Take 1 tablet (5 mg total) by mouth daily. 06/13/20   Lavina Hamman, MD  ascorbic acid (VITAMIN C) 500 MG tablet  Take 1 tablet (500 mg total) by mouth daily. 06/13/20   Lavina Hamman, MD  aspirin EC 81 MG EC tablet Take 1 tablet (81 mg total) by mouth daily. Swallow whole. 06/13/20   Lavina Hamman, MD  carvedilol (COREG) 3.125 MG tablet Take 1 tablet (3.125 mg total) by mouth 2 (two) times daily with a meal. Patient taking differently: Take 3.125 mg by mouth 2 (two) times daily with a meal.  06/12/20   Lavina Hamman, MD  hydrALAZINE (APRESOLINE) 50 MG tablet Take 1 tablet (50 mg total) by mouth 3 (three) times daily. 06/22/20 09/20/20  Tobb, Kardie, DO  isosorbide mononitrate (IMDUR) 30 MG 24 hr tablet Take 1 tablet (30 mg total) by mouth daily. 06/13/20   Lavina Hamman, MD  losartan-hydrochlorothiazide (HYZAAR) 100-25 MG tablet Take 1 tablet by mouth daily. 06/12/20   Lavina Hamman, MD  zinc sulfate 220 (50 Zn) MG capsule Take 1 capsule (220 mg total) by mouth  daily. 06/13/20   Lavina Hamman, MD    Physical Exam: Constitutional: Moderately built and nourished. Vitals:   06/23/20 2330 06/24/20 0000 06/24/20 0015 06/24/20 0136  BP: (!) 131/51 (!) 132/46  (!) 162/66  Pulse: 72 64 63 67  Resp: '18 18 19 20  ' Temp:  98.1 F (36.7 C)  98 F (36.7 C)  TempSrc:  Oral    SpO2: 97% 97% 97% 96%  Weight:    81.5 kg  Height:    '5\' 1"'  (1.549 m)   Eyes: Anicteric no pallor. ENMT: No discharge from the ears eyes nose or mouth. Neck: No mass felt.  No neck rigidity. Respiratory: No rhonchi or crepitations. Cardiovascular: S1-S2 heard. Abdomen: Soft nontender bowel sounds present.  No guarding or rigidity. Musculoskeletal: No edema. Skin: No rash. Neurologic: Alert awake oriented to time place and person.  Moves all extremities. Psychiatric: Appears normal.  Normal affect.   Labs on Admission: I have personally reviewed following labs and imaging studies  CBC: Recent Labs  Lab 06/22/20 1444 06/23/20 1736  WBC 21.1* 16.1*  NEUTROABS  --  12.1*  HGB 15.4 14.9  HCT 46.8* 43.1  MCV 85 82.7  PLT 381  563   Basic Metabolic Panel: Recent Labs  Lab 06/22/20 1444 06/23/20 1736  NA 129* 128*  K 4.7 3.6  CL 94* 98  CO2 17* 18*  GLUCOSE 112* 124*  BUN 78* 110*  CREATININE 1.47* 1.58*  CALCIUM 9.0 8.4*  MG 2.5*  --    GFR: Estimated Creatinine Clearance: 34.8 mL/min (A) (by C-G formula based on SCr of 1.58 mg/dL (H)). Liver Function Tests: Recent Labs  Lab 06/23/20 1736  AST 20  ALT 28  ALKPHOS 76  BILITOT 0.4  PROT 6.9  ALBUMIN 2.5*   No results for input(s): LIPASE, AMYLASE in the last 168 hours. No results for input(s): AMMONIA in the last 168 hours. Coagulation Profile: No results for input(s): INR, PROTIME in the last 168 hours. Cardiac Enzymes: No results for input(s): CKTOTAL, CKMB, CKMBINDEX, TROPONINI in the last 168 hours. BNP (last 3 results) No results for input(s): PROBNP in the last 8760 hours. HbA1C: No results for input(s): HGBA1C in the last 72 hours. CBG: No results for input(s): GLUCAP in the last 168 hours. Lipid Profile: No results for input(s): CHOL, HDL, LDLCALC, TRIG, CHOLHDL, LDLDIRECT in the last 72 hours. Thyroid Function Tests: No results for input(s): TSH, T4TOTAL, FREET4, T3FREE, THYROIDAB in the last 72 hours. Anemia Panel: No results for input(s): VITAMINB12, FOLATE, FERRITIN, TIBC, IRON, RETICCTPCT in the last 72 hours. Urine analysis:    Component Value Date/Time   COLORURINE YELLOW 06/23/2020 2025   APPEARANCEUR CLOUDY (A) 06/23/2020 2025   LABSPEC 1.010 06/23/2020 2025   PHURINE 5.5 06/23/2020 2025   GLUCOSEU NEGATIVE 06/23/2020 2025   HGBUR NEGATIVE 06/23/2020 2025   BILIRUBINUR NEGATIVE 06/23/2020 2025   KETONESUR NEGATIVE 06/23/2020 2025   PROTEINUR NEGATIVE 06/23/2020 2025   NITRITE POSITIVE (A) 06/23/2020 2025   LEUKOCYTESUR LARGE (A) 06/23/2020 2025   Sepsis Labs: '@LABRCNTIP' (procalcitonin:4,lacticidven:4) )No results found for this or any previous visit (from the past 240 hour(s)).   Radiological Exams on  Admission: US Renal  Result Date: 06/23/2020 CLINICAL DATA:  Acute kidney injury EXAM: RENAL / URINARY TRACT ULTRASOUND COMPLETE COMPARISON:  None. FINDINGS: Right Kidney: Renal measurements: 10.5 x 4.5 x 5.2 cm = volume: 128.4 mL. Echogenicity within normal limits. No mass or hydronephrosis visualized. Left Kidney: Renal measurements: 10.5 x 5.2  x 4.8 cm = volume: 137.9 mL. Echogenicity within normal limits. No mass or hydronephrosis visualized. Bladder: Appears normal for degree of bladder distention. Other: None IMPRESSION: Negative examination Electronically Signed   By: Donavan Foil M.D.   On: 06/23/2020 21:13   DG Chest Port 1 View  Result Date: 06/23/2020 CLINICAL DATA:  Shortness of breath, history of COVID-19 positivity EXAM: PORTABLE CHEST 1 VIEW COMPARISON:  06/08/2020 FINDINGS: Cardiac shadow is within normal limits. The lungs are well aerated bilaterally. Minimal patchy airspace disease is noted in left lung base. The overall inspiratory effort has improved significantly in the interval from the prior exam. No other focal abnormality is noted. IMPRESSION: Significant improved aeration bilaterally. Minimal atelectatic changes are seen in the left base. Electronically Signed   By: Inez Catalina M.D.   On: 06/23/2020 18:48    EKG: Independently reviewed.  Normal sinus rhythm heart rate is around 62 bpm.  Assessment/Plan Principal Problem:   Acute kidney injury ([place]) Active Problems:   Hypertension   Acute cystitis without hematuria   ARF (acute renal failure) ([place])    1. Acute renal failure -could be multifactorial including patient was recently started on losartan hydrochlorothiazide spironolactone and patient's blood pressure was running low and also patient had poor oral intake.  Renal ultrasound does not show anything acute.  Will hold off patient's losartan hydrochlorothiazide spironolactone continue with gentle hydration follow intake output metabolic panel. 2. Leukocytosis  could be from recent use of steroids however since patient does have possibility of UTI with urine showing bacteria follow cultures continue with ceftriaxone. 3. Weakness could be from low blood pressure which we will closely monitor. 4. Hypertension -during last day patient had hypertensive urgency was started on multiple antihypertensives.  Patient's cardiologist had decreased hydralazine dose.  For now I am only keeping patient on amlodipine and Coreg with holding orders for blood pressure medicines.  We will keep patient on as needed IV hydralazine.  Follow blood pressure trends. 5. Recent admission for Covid infection chest x-ray shows improvement and patient is not hypoxic. 6. Hyponatremia could be from dehydration and also from recent use of spironolactone and hydrochlorothiazide.  Follow metabolic panel after hydration. 7. Recent 2D echo showed diastolic dysfunction closely monitor respiratory status while patient receiving fluids.   DVT prophylaxis: Lovenox. Code Status: DNR. Family Communication: Patient's daughter. Disposition Plan: Home. Consults called: None. Admission status: Observation.   Rise Patience MD [facility] Pager [phone].  If 7PM-7AM, please contact night-coverage [email or web address] Password [email or web address]  06/24/2020, 4:17 AM

## 2020-06-24 NOTE — Evaluation (Signed)
Physical Therapy Evaluation Patient Details Name: Latoya Rios MRN: [id number] DOB: Jul 16, 1956 Today's Date: 06/24/2020   History of Present Illness  64 yo female admitted with abnormal labs, AKI, UTI, dyspnea, and fatigue. Recent dx of COVID (9/23-9/27). Hx of CHF.  Clinical Impression  On eval, pt was Min guard assist for mobility She walked ~200 feet around the unit. Mildly unsteady at times. O2 95% on RA during session. She denied dizziness. Will plan to follow during hospital stay.     Follow Up Recommendations No PT follow up;Supervision - Intermittent    Equipment Recommendations  None recommended by PT    Recommendations for Other Services       Precautions / Restrictions Precautions Precautions: Fall Restrictions Weight Bearing Restrictions: No      Mobility  Bed Mobility Overal bed mobility: Modified Independent                Transfers Overall transfer level: Needs assistance   Transfers: Sit to/from Stand Sit to Stand: Supervision            Ambulation/Gait Ambulation/Gait assistance: Min guard Gait Distance (Feet): 200 Feet Assistive device: None Gait Pattern/deviations: Step-through pattern;Decreased stride length     General Gait Details: Mildly unsteady. No overt LOB. O2 95% on RA. Pt denied dizziness.  Stairs            Wheelchair Mobility    Modified Rankin (Stroke Patients Only)       Balance Overall balance assessment: Mild deficits observed, not formally tested                                           Pertinent Vitals/Pain Pain Assessment: No/denies pain    Home Living Family/patient expects to be discharged to:: Private residence Living Arrangements: Alone   Type of Home: Apartment Home Access: Stairs to enter Entrance Stairs-Rails: None Entrance Stairs-Number of Steps: 1 Home Layout: One level Home Equipment: Bedside commode      Prior Function Level of Independence: Independent                Hand Dominance        Extremity/Trunk Assessment   Upper Extremity Assessment Upper Extremity Assessment: Overall WFL for tasks assessed    Lower Extremity Assessment Lower Extremity Assessment: Overall WFL for tasks assessed    Cervical / Trunk Assessment Cervical / Trunk Assessment: Normal  Communication   Communication: No difficulties  Cognition Arousal/Alertness: Awake/alert Behavior During Therapy: WFL for tasks assessed/performed Overall Cognitive Status: Within Functional Limits for tasks assessed                                        General Comments      Exercises     Assessment/Plan    PT Assessment Patient needs continued PT services  PT Problem List Decreased mobility       PT Treatment Interventions Gait training;Therapeutic activities;Therapeutic exercise;Patient/family education;Functional mobility training    PT Goals (Current goals can be found in the Care Plan section)  Acute Rehab PT Goals Patient Stated Goal: home soon PT Goal Formulation: With patient Time For Goal Achievement: 07/08/20 Potential to Achieve Goals: Good    Frequency Min 3X/week   Barriers to discharge        Co-evaluation  AM-PAC PT "6 Clicks" Mobility  Outcome Measure Help needed turning from your back to your side while in a flat bed without using bedrails?: None Help needed moving from lying on your back to sitting on the side of a flat bed without using bedrails?: None Help needed moving to and from a bed to a chair (including a wheelchair)?: None Help needed standing up from a chair using your arms (e.g., wheelchair or bedside chair)?: None Help needed to walk in hospital room?: A Little Help needed climbing 3-5 steps with a railing? : A Little 6 Click Score: 22    End of Session Equipment Utilized During Treatment: Gait belt Activity Tolerance: Patient tolerated treatment well Patient left: in bed;with  call bell/phone within reach   PT Visit Diagnosis: Unsteadiness on feet (R26.81)    Time: 7579-7282 PT Time Calculation (min) (ACUTE ONLY): 23 min   Charges:   PT Evaluation $PT Eval Low Complexity: 1 Low PT Treatments $Gait Training: 8-22 mins          Faye Ramsay, PT Acute Rehabilitation  Office: [phone] Pager: [phone]

## 2020-06-24 NOTE — Plan of Care (Signed)
  Problem: Activity: Goal: Risk for activity intolerance will decrease Outcome: Progressing   Problem: Safety: Goal: Ability to remain free from injury will improve Outcome: Progressing   Problem: Skin Integrity: Goal: Risk for impaired skin integrity will decrease Outcome: Progressing   Pt. Encourage to ambulate when tolerated. Fall precaution measures in place. Skin care done

## 2020-06-24 NOTE — Progress Notes (Signed)
PROGRESS NOTE  Latoya Rios  DOB: 11-10-1955  PCP: Aviva Kluver HQP:[id number]  DOA: 06/23/2020  LOS: 0 days   Chief Complaint  Patient presents with  . Shortness of Breath   Brief narrative: Latoya Rios is a 64 y.o. female with PMH of hypertension, recently hospitalized for COVID-19 pneumonia (9/23 -9/27 ) and completed a course of steroids at home on 06/17/2020.   Patient was previously hypertensive but she was weaned off medications in December 2020 after her blood pressure improved with weight loss.  In last hospitalization for Covid, she was found to be in hypertensive urgency and hence her previous medications were resumed.  Since discharge, patient was feeling weak and also hallucinating at times.  Her blood pressure readings at home were close to 100. 10/7, she visited cardiologist Dr. Servando Salina for uncontrolled hypertension.  Medications were adjusted.  Lab work was sent which resulted on 10/8 showing acute kidney injury and leukocytosis and hence patient was sent to ED.  In the ED, patient was afebrile, heart rate in 70s. She was initially hypotensive with systolic blood pressure in the 80s for which bolus was given with subsequent improvement in blood pressure.   EKG showed normal sinus rhythm with heart rate of 62 bpm, no ST-T wave changes.  High sensory troponins were negative.   Chest x-ray showed significant improved aeration bilaterally.  Urinalysis showed cloudy yellow urine with large amount of leukocytes, positive nitrite and many bacteria. Lab work showed WBC count of 16.1, creatinine elevated to 1.58, sodium 128. Patient was admitted for further evaluation management.  Subjective: Patient was seen and examined this morning. Pleasant middle-aged Caucasian female.  Looks older for her age.  Not in physical distress. Chart reviewed. Afebrile, blood pressure running elevated up to 160s, maintaining oxygen saturation on room air. Labs this morning with improving WBC count, sodium  level, creatinine.  Assessment/Plan: Acute kidney injury -Multifactorial: Poor oral intake, combination of losartan, HCTZ, Aldactone and low blood pressure. -Renal ultrasound unremarkable. Recent Labs    06/08/20 0225 06/11/20 0548 06/12/20 0457 06/22/20 1444 06/23/20 1736 06/24/20 0443  BUN 17 28* 27* 78* 110* 86*  CREATININE 0.71 0.63 0.68 1.47* 1.58* 0.98   Essential hypertension  -9/27, she was discharged on Coreg, losartan, HCTZ, Aldactone, hydralazine -10/7, she followed up with cardiologist Dr. Servando Salina.  Recommendation at that time was to stop Aldactone, decrease hydralazine to 50 mg daily, and continue Coreg at 3.125 mg twice daily, losartan 100 mg daily and HCTZ 25 mg daily.   -With new information of AKI, we may need to further adjust her medications. -Currently ordered for Coreg 3.125 mg twice daily and amlodipine 5 mg daily.  Monitor blood pressure on the same. -Creatinine has improved but BUN still remains elevated.  Continue IV fluid.  -Echocardiogram 9/24 showed EF of 60 to 65% and grade 1 diastolic dysfunction  Hyponatremia -Again likely secondary to poor oral intake and use of diuretics. -Improving sodium level.  Continue to monitor Recent Labs  Lab 06/22/20 1444 06/23/20 1736 06/24/20 0443  NA 129* 128* 132*   UTI -Urinalysis showed cloudy yellow urine with large amount of leukocytes, positive nitrite and many bacteria. -On IV Rocephin.  Follow-up urine culture blood.  Generalized weakness -Combination of low blood pressure, UTI, AKI, poor oral intake -PT eval  Recent Covid pneumonia -Improvement in chest x-ray noted.  Not on supplemental oxygen -Patient states she has not considered getting vaccine yet  Mobility: Encourage ambulation. Code Status:   Code Status: DNR  Nutritional status: Body mass index is 33.95 kg/m.     Diet Order            Diet Heart Room service appropriate? Yes; Fluid consistency: Thin  Diet effective now                   DVT prophylaxis: enoxaparin (LOVENOX) injection 40 mg Start: 06/24/20 1000   Antimicrobials:  IV Rocephin Fluid: Normal saline at 75 mL/h Consultants: None Family Communication:  None at bedside  Status is: Observation  The patient will require care spanning > 2 midnights and should be moved to inpatient because: Ongoing diagnostic testing needed not appropriate for outpatient work up and IV treatments appropriate due to intensity of illness or inability to take PO  Dispo: The patient is from: Home              Anticipated d/c is to: Home              Anticipated d/c date is: 2 days              Patient currently is not medically stable to d/c.       Infusions:  . sodium chloride 75 mL/hr at 06/24/20 0500  . cefTRIAXone (ROCEPHIN)  IV      Scheduled Meds: . amLODipine  5 mg Oral Daily  . aspirin EC  81 mg Oral Daily  . carvedilol  3.125 mg Oral BID WC  . enoxaparin (LOVENOX) injection  40 mg Subcutaneous Q24H    Antimicrobials: Anti-infectives (From admission, onward)   Start     Dose/Rate Route Frequency Ordered Stop   06/24/20 2200  cefTRIAXone (ROCEPHIN) 1 g in sodium chloride 0.9 % 100 mL IVPB        1 g 200 mL/hr over 30 Minutes Intravenous Every 24 hours 06/24/20 0417     06/23/20 2130  cefTRIAXone (ROCEPHIN) 1 g in sodium chloride 0.9 % 100 mL IVPB        1 g 200 mL/hr over 30 Minutes Intravenous  Once 06/23/20 2129 06/24/20 0000      PRN meds: acetaminophen **OR** acetaminophen, albuterol, hydrALAZINE, melatonin, polyethylene glycol   Objective: Vitals:   06/24/20 0536 06/24/20 0932  BP: (!) 144/66 (!) 147/62  Pulse: 72 69  Resp: 20 18  Temp: 97.9 F (36.6 C) 97.8 F (36.6 C)  SpO2: 97% 94%    Intake/Output Summary (Last 24 hours) at 06/24/2020 1219 Last data filed at 06/24/2020 0600 Gross per 24 hour  Intake 202.28 ml  Output 650 ml  Net -447.72 ml   Filed Weights   06/23/20 1647 06/24/20 0136  Weight: 81.2 kg 81.5 kg   Weight  change:  Body mass index is 33.95 kg/m.   Physical Exam: General exam: Appears calm and comfortable.  Not in physical distress Skin: No rashes, lesions or ulcers. HEENT: Atraumatic, normocephalic, supple neck, no obvious bleeding Lungs: Clear to auscultation bilaterally CVS: Regular rate and rhythm, no murmur GI/Abd soft, nontender, nondistended, bowel sound present CNS: Alert, awake, oriented x3 Psychiatry: Mood appropriate Extremities: No pedal edema, no calf tenderness  Data Review: I have personally reviewed the laboratory data and studies available.  Recent Labs  Lab 06/22/20 1444 06/23/20 1736 06/24/20 0443  WBC 21.1* 16.1* 12.2*  NEUTROABS  --  12.1* 8.9*  HGB 15.4 14.9 14.8  HCT 46.8* 43.1 43.9  MCV 85 82.7 84.9  PLT 381 391 334   Recent Labs  Lab 06/22/20 1444 06/23/20 1736  06/24/20 0443 06/24/20 0940  NA 129* 128* 132*  --   K 4.7 3.6 3.5  --   CL 94* 98 106  --   CO2 17* 18* 16*  --   GLUCOSE 112* 124* 120*  --   BUN 78* 110* 86*  --   CREATININE 1.47* 1.58* 0.98  --   CALCIUM 9.0 8.4* 8.4*  --   MG 2.5*  --   --  2.6*  PHOS  --   --   --  4.4    F/u labs ordered.  Signed, Lorin Glass, MD [facility] 06/24/2020

## 2020-06-24 NOTE — ED Provider Notes (Signed)
Blood pressure (!) 132/46, pulse 64, temperature 97.6 F (36.4 C), temperature source Tympanic, resp. rate 18, height 5\' 1"  (1.549 m), weight 81.2 kg, SpO2 97 %.  In short, Latoya Rios is a 64 y.o. female with a chief complaint of Shortness of Breath .  Refer to the original H&P for additional details.  12:50 AM  Patient has been managed and admitted by the earlier ED team.  At the time of transport the patient became very upset that she is a "DNR" but does not have the yellow form stating this.  EMS is unable to honor her DNR request while in transit if she does not have this form.  I discussed with the patient and her daughter 77 by phone that the patient does not wish to be intubated or have CPR or other interventions.  I have placed a DNR order in our system.  We completed the yellow DNR form in the ED for transport and patient is in agreement with transport. She would like to discuss having a MOST form completed during this hospitalization if possible.   Latoya Land, MD 06/24/20 [phone]

## 2020-06-25 LAB — CBC WITH DIFFERENTIAL/PLATELET
Abs Immature Granulocytes: 0.06 10*3/uL (ref 0.00–0.07)
Basophils Absolute: 0 10*3/uL (ref 0.0–0.1)
Basophils Relative: 0 %
Eosinophils Absolute: 0.4 10*3/uL (ref 0.0–0.5)
Eosinophils Relative: 4 %
HCT: 41.3 % (ref 36.0–46.0)
Hemoglobin: 13.6 g/dL (ref 12.0–15.0)
Immature Granulocytes: 1 %
Lymphocytes Relative: 16 %
Lymphs Abs: 1.6 10*3/uL (ref 0.7–4.0)
MCH: 28.4 pg (ref 26.0–34.0)
MCHC: 32.9 g/dL (ref 30.0–36.0)
MCV: 86.2 fL (ref 80.0–100.0)
Monocytes Absolute: 1.4 10*3/uL — ABNORMAL HIGH (ref 0.1–1.0)
Monocytes Relative: 15 %
Neutro Abs: 6.1 10*3/uL (ref 1.7–7.7)
Neutrophils Relative %: 64 %
Platelets: 326 10*3/uL (ref 150–400)
RBC: 4.79 MIL/uL (ref 3.87–5.11)
RDW: 13.8 % (ref 11.5–15.5)
WBC: 9.5 10*3/uL (ref 4.0–10.5)
nRBC: 0 % (ref 0.0–0.2)

## 2020-06-25 LAB — BASIC METABOLIC PANEL
Anion gap: 10 (ref 5–15)
BUN: 47 mg/dL — ABNORMAL HIGH (ref 8–23)
CO2: 19 mmol/L — ABNORMAL LOW (ref 22–32)
Calcium: 8.1 mg/dL — ABNORMAL LOW (ref 8.9–10.3)
Chloride: 107 mmol/L (ref 98–111)
Creatinine, Ser: 0.75 mg/dL (ref 0.44–1.00)
GFR, Estimated: >60 mL/min (ref >60)
Glucose, Bld: 82 mg/dL (ref 70–99)
Potassium: 4.1 mmol/L (ref 3.5–5.1)
Sodium: 136 mmol/L (ref 135–145)

## 2020-06-25 MED ORDER — LOSARTAN POTASSIUM 50 MG PO TABS: 50.0000 mg | ORAL_TABLET | Freq: Every day | ORAL | 0 refills | Status: DC

## 2020-06-25 MED ORDER — CARVEDILOL 3.125 MG PO TABS
3.1250 mg | ORAL_TABLET | Freq: Two times a day (BID) | ORAL | 0 refills | Status: DC
Start: 2020-06-25 — End: 2020-08-14

## 2020-06-25 MED ORDER — SACCHAROMYCES BOULARDII 250 MG PO CAPS: 250.0000 mg | ORAL_CAPSULE | Freq: Two times a day (BID) | ORAL | 0 refills | Status: AC

## 2020-06-25 MED ORDER — CEFDINIR 300 MG PO CAPS: 300.0000 mg | ORAL_CAPSULE | Freq: Two times a day (BID) | ORAL | 0 refills | Status: AC

## 2020-06-25 MED ORDER — LOSARTAN POTASSIUM 50 MG PO TABS
50.0000 mg | ORAL_TABLET | Freq: Every day | ORAL | Status: DC
  Administered 2020-06-25: 50 mg via ORAL
  Filled 2020-06-25: qty 1

## 2020-06-25 NOTE — Discharge Summary (Signed)
Physician Discharge Summary  Latoya Rios TTS:[id number] DOB: 24-Aug-1956 DOA: 06/23/2020  PCP: Pcp, No  Admit date: 06/23/2020 Discharge date: 06/25/2020  Admitted From: Home Discharge disposition: Home   Code Status: DNR  Diet Recommendation: Cardiac diet  Discharge Diagnosis:   Principal Problem:   Acute kidney injury (HCC) Active Problems:   Hypertension   Acute cystitis without hematuria   ARF (acute renal failure) (HCC)   AKI (acute kidney injury) (HCC)  History of Present Illness / Brief narrative:  Latoya Rios is a 64 y.o. female with PMH of hypertension, recently hospitalized for COVID-19 pneumonia (9/23 -9/27 ) and completed a course of steroids at home on 06/17/2020.   Patient was previously hypertensive but she was weaned off medications in December 2020 after her blood pressure improved with weight loss.  In last hospitalization for Covid, she was found to be in hypertensive urgency and hence her previous medications were resumed.  Since discharge, patient was feeling weak and also hallucinating at times.  Her blood pressure readings at home were close to 100. 10/7, she visited cardiologist Dr. Servando Salina for uncontrolled hypertension.  Medications were adjusted.  Lab work was sent which resulted on 10/8 showing acute kidney injury and leukocytosis and hence patient was sent to ED.  In the ED, patient was afebrile, heart rate in 70s. She was initially hypotensive with systolic blood pressure in the 80s for which bolus was given with subsequent improvement in blood pressure.   EKG showed normal sinus rhythm with heart rate of 62 bpm, no ST-T wave changes.  High sensory troponins were negative.  Chest x-ray showed significant improved aeration bilaterally.  Urinalysis showed cloudy yellow urine with large amount of leukocytes, positive nitrite and many bacteria. Lab work showed WBC count of 16.1, creatinine elevated to 1.58, sodium 128. Patient was admitted for further  evaluation management.  Subjective:  Seen and examined this morning.  Pleasant elderly female. Not in distress.  Feels good.  Sitting up in chair.  Feels ready to go home. I had a long conversation with her at bedside and her daughter on the phone about patient's clinical condition and the plan.  Hospital Course:  Acute kidney injury -Multifactorial: Poor oral intake, combination of losartan, HCTZ, Aldactone and low blood pressure. -Renal ultrasound unremarkable. Recent Labs    06/08/20 0225 06/11/20 0548 06/12/20 0457 06/22/20 1444 06/23/20 1736 06/24/20 0443 06/25/20 0403  BUN 17 28* 27* 78* 110* 86* 47*  CREATININE 0.71 0.63 0.68 1.47* 1.58* 0.98 0.75   Essential hypertension  -9/27, she was discharged on Coreg, losartan, HCTZ, Aldactone, hydralazine -10/7, she followed up with cardiologist Dr. Servando Salina.  Recommendation at that time was to stop Aldactone, decrease hydralazine to 50 mg daily, and continue Coreg at 3.125 mg twice daily, losartan 100 mg daily and HCTZ 25 mg daily.   -While in the hospital, most of her medicines were held.  She was adequately hydrated. -Her blood pressure is now improved.  Will discharge her on Coreg 3.125 mg twice daily and losartan 50 mg daily.  Admission the patient's daughter to keep her blood pressure monitored at home.  If the blood pressure exceeds 150 systolic, losartan dose should be increased to 100 mg daily.   -Continue to follow with PCP as an outpatient. -Echocardiogram 9/24 showed EF of 60 to 65% and grade 1 diastolic dysfunction  Hyponatremia -Again likely secondary to poor oral intake and use of diuretics. -Improving sodium level.  Continue to monitor. Recent Labs  Lab  06/22/20 1444 06/23/20 1736 06/24/20 0443 06/25/20 0403  NA 129* 128* 132* 136   UTI -Urinalysis showed cloudy yellow urine with large amount of leukocytes, positive nitrite and many bacteria. -Currently on IV Rocephin.  Urine culture is growing more than  100,000 CFU per mL of gram-negative rod. -We will discharge home oral Omnicef for next 5 days with probiotics.  Generalized weakness -Combination of low blood pressure, UTI, AKI, poor oral intake -PT eval obtained.  No follow-up required.  Recent Covid pneumonia -Improvement in chest x-ray noted.  Not on supplemental oxygen -I recommended her to get vaccine after 3 weeks of last positive test result.  Patient states she will consider it.  Stable for discharge to home today.  Wound care:    Discharge Exam:   Vitals:   06/24/20 2105 06/25/20 0500 06/25/20 0513 06/25/20 0700  BP: (!) 164/60  (!) 169/66 126/63  Pulse: 70  65 68  Resp: 16  16 16   Temp: 98 F (36.7 C)  97.9 F (36.6 C) 98 F (36.7 C)  TempSrc:    Oral  SpO2: 95%  95% 94%  Weight:  79.9 kg    Height:        Body mass index is 33.28 kg/m.  General exam: Appears calm and comfortable.  Not in physical distress Skin: No rashes, lesions or ulcers. HEENT: Atraumatic, normocephalic, supple neck, no obvious bleeding Lungs: Clear to auscultation bilaterally CVS: Regular rate and rhythm, no murmur GI/Abd soft, nontender, nondistended, bowel sound present CNS: Alert, awake, oriented x3 Psychiatry: Mood appropriate Extremities: No pedal edema, no calf tenderness  Follow ups:   Discharge Instructions    Diet - low sodium heart healthy   Complete by: As directed    Increase activity slowly   Complete by: As directed       Follow-up Information    Tobb, Kardie, DO Follow up.   Specialty: Cardiology Contact information: [address] [place] [address] [phone]               Recommendations for Outpatient Follow-Up:   1. Follow-up with PCP as an outpatient  Discharge Instructions:  Follow with Primary MD Pcp, No in 7 days   Get CBC/BMP checked in next visit within 1 week by PCP or SNF MD ( we routinely change or add medications that can affect your baseline labs and fluid status,  therefore we recommend that you get the mentioned basic workup next visit with your PCP, your PCP may decide not to get them or add new tests based on their clinical decision)  On your next visit with your PCP, please Get Medicines reviewed and adjusted.  Please request your PCP  to go over all Hospital Tests and Procedure/Radiological results at the follow up, please get all Hospital records sent to your Prim MD by signing hospital release before you go home.  Activity: As tolerated with Full fall precautions use walker/cane & assistance as needed  For Heart failure patients - Check your Weight same time everyday, if you gain over 2 pounds, or you develop in leg swelling, experience more shortness of breath or chest pain, call your Primary MD immediately. Follow Cardiac Low Salt Diet and 1.5 lit/day fluid restriction.  If you have smoked or chewed Tobacco in the last 2 yrs please stop smoking, stop any regular Alcohol  and or any Recreational drug use.  If you experience worsening of your admission symptoms, develop shortness of breath, life threatening emergency, suicidal  or homicidal thoughts you must seek medical attention immediately by calling 911 or calling your MD immediately  if symptoms less severe.  You Must read complete instructions/literature along with all the possible adverse reactions/side effects for all the Medicines you take and that have been prescribed to you. Take any new Medicines after you have completely understood and accpet all the possible adverse reactions/side effects.   Do not drive, operate heavy machinery, perform activities at heights, swimming or participation in water activities or provide baby sitting services if your were admitted for syncope or siezures until you have seen by Primary MD or a Neurologist and advised to do so again.  Do not drive when taking Pain medications.  Do not take more than prescribed Pain, Sleep and Anxiety Medications  Wear Seat  belts while driving.   Please note You were cared for by a hospitalist during your hospital stay. If you have any questions about your discharge medications or the care you received while you were in the hospital after you are discharged, you can call the unit and asked to speak with the hospitalist on call if the hospitalist that took care of you is not available. Once you are discharged, your primary care physician will handle any further medical issues. Please note that NO REFILLS for any discharge medications will be authorized once you are discharged, as it is imperative that you return to your primary care physician (or establish a relationship with a primary care physician if you do not have one) for your aftercare needs so that they can reassess your need for medications and monitor your lab values.    Allergies as of 06/25/2020      Reactions   Clonidine Other (See Comments)   unk   Nisoldipine Other (See Comments)   unk   Penicillins Other (See Comments)   unk   Quinacrine Other (See Comments)   unk   Quinapril Other (See Comments)   unk      Medication List    STOP taking these medications   amLODipine 5 MG tablet Commonly known as: NORVASC   hydrALAZINE 50 MG tablet Commonly known as: APRESOLINE   isosorbide mononitrate 30 MG 24 hr tablet Commonly known as: IMDUR   losartan-hydrochlorothiazide 100-25 MG tablet Commonly known as: HYZAAR     TAKE these medications   ascorbic acid 500 MG tablet Commonly known as: VITAMIN C Take 1 tablet (500 mg total) by mouth daily.   aspirin 81 MG EC tablet Take 1 tablet (81 mg total) by mouth daily. Swallow whole.   carvedilol 3.125 MG tablet Commonly known as: COREG Take 1 tablet (3.125 mg total) by mouth 2 (two) times daily with a meal.   cefdinir 300 MG capsule Commonly known as: OMNICEF Take 1 capsule (300 mg total) by mouth 2 (two) times daily for 5 days.   losartan 50 MG tablet Commonly known as: COZAAR Take 1  tablet (50 mg total) by mouth daily. Start taking on: June 26, 2020   saccharomyces boulardii 250 MG capsule Commonly known as: FLORASTOR Take 1 capsule (250 mg total) by mouth 2 (two) times daily for 5 days.   zinc sulfate 220 (50 Zn) MG capsule Take 1 capsule (220 mg total) by mouth daily.       Time coordinating discharge: 35 minutes  The results of significant diagnostics from this hospitalization (including imaging, microbiology, ancillary and laboratory) are listed below for reference.    Procedures and Diagnostic Studies:  US Renal  Result Date: 06/23/2020 CLINICAL DATA:  Acute kidney injury EXAM: RENAL / URINARY TRACT ULTRASOUND COMPLETE COMPARISON:  None. FINDINGS: Right Kidney: Renal measurements: 10.5 x 4.5 x 5.2 cm = volume: 128.4 mL. Echogenicity within normal limits. No mass or hydronephrosis visualized. Left Kidney: Renal measurements: 10.5 x 5.2 x 4.8 cm = volume: 137.9 mL. Echogenicity within normal limits. No mass or hydronephrosis visualized. Bladder: Appears normal for degree of bladder distention. Other: None IMPRESSION: Negative examination Electronically Signed   By: Jasmine Pang M.D.   On: 06/23/2020 21:13   DG Chest Port 1 View  Result Date: 06/23/2020 CLINICAL DATA:  Shortness of breath, history of COVID-19 positivity EXAM: PORTABLE CHEST 1 VIEW COMPARISON:  06/08/2020 FINDINGS: Cardiac shadow is within normal limits. The lungs are well aerated bilaterally. Minimal patchy airspace disease is noted in left lung base. The overall inspiratory effort has improved significantly in the interval from the prior exam. No other focal abnormality is noted. IMPRESSION: Significant improved aeration bilaterally. Minimal atelectatic changes are seen in the left base. Electronically Signed   By: Alcide Clever M.D.   On: 06/23/2020 18:48     Labs:   Basic Metabolic Panel: Recent Labs  Lab 06/22/20 1444 06/22/20 1444 06/23/20 1736 06/23/20 1736 06/24/20 0443  06/24/20 0940 06/25/20 0403  NA 129*  --  128*  --  132*  --  136  K 4.7   < > 3.6   < > 3.5  --  4.1  CL 94*  --  98  --  106  --  107  CO2 17*  --  18*  --  16*  --  19*  GLUCOSE 112*  --  124*  --  120*  --  82  BUN 78*  --  110*  --  86*  --  47*  CREATININE 1.47*  --  1.58*  --  0.98  --  0.75  CALCIUM 9.0  --  8.4*  --  8.4*  --  8.1*  MG 2.5*  --   --   --   --  2.6*  --   PHOS  --   --   --   --   --  4.4  --    < > = values in this interval not displayed.   GFR Estimated Creatinine Clearance: 68 mL/min (by C-G formula based on SCr of 0.75 mg/dL). Liver Function Tests: Recent Labs  Lab 06/23/20 1736 06/24/20 0443  AST 20 18  ALT 28 24  ALKPHOS 76 69  BILITOT 0.4 0.5  PROT 6.9 6.6  ALBUMIN 2.5* 2.5*   No results for input(s): LIPASE, AMYLASE in the last 168 hours. No results for input(s): AMMONIA in the last 168 hours. Coagulation profile No results for input(s): INR, PROTIME in the last 168 hours.  CBC: Recent Labs  Lab 06/22/20 1444 06/23/20 1736 06/24/20 0443 06/25/20 0403  WBC 21.1* 16.1* 12.2* 9.5  NEUTROABS  --  12.1* 8.9* 6.1  HGB 15.4 14.9 14.8 13.6  HCT 46.8* 43.1 43.9 41.3  MCV 85 82.7 84.9 86.2  PLT 381 391 334 326   Cardiac Enzymes: No results for input(s): CKTOTAL, CKMB, CKMBINDEX, TROPONINI in the last 168 hours. BNP: Invalid input(s): POCBNP CBG: No results for input(s): GLUCAP in the last 168 hours. D-Dimer Recent Labs    06/23/20 1855  DDIMER 0.75*   Hgb A1c No results for input(s): HGBA1C in the last 72 hours. Lipid Profile No results for  input(s): CHOL, HDL, LDLCALC, TRIG, CHOLHDL, LDLDIRECT in the last 72 hours. Thyroid function studies No results for input(s): TSH, T4TOTAL, T3FREE, THYROIDAB in the last 72 hours.  Invalid input(s): FREET3 Anemia work up No results for input(s): VITAMINB12, FOLATE, FERRITIN, TIBC, IRON, RETICCTPCT in the last 72 hours. Microbiology Recent Results (from the past 240 hour(s))  Urine  culture     Status: Abnormal (Preliminary result)   Collection Time: 06/23/20  9:24 PM   Specimen: Urine, Random  Result Value Ref Range Status   Specimen Description   Final    URINE, RANDOM Performed at [facility], [address] [place], [place] [address]    Special Requests   Final    NONE Performed at [facility], [address] [place], [place] [address]    Culture >=100,000 COLONIES/mL GRAM NEGATIVE RODS (A)  Final   Report Status PENDING  Incomplete     Signed: Laverne Klugh  [facility] 06/25/2020, 11:55 AM

## 2020-06-26 LAB — URINE CULTURE: Culture: >=100000 — AB

## 2020-07-21 ENCOUNTER — Ambulatory Visit (INDEPENDENT_AMBULATORY_CARE_PROVIDER_SITE_OTHER): Payer: BC Managed Care – PPO | Admitting: Cardiology

## 2020-07-21 ENCOUNTER — Telehealth: Payer: Self-pay | Admitting: Medical

## 2020-07-21 ENCOUNTER — Encounter: Payer: Self-pay | Admitting: Cardiology

## 2020-07-21 ENCOUNTER — Ambulatory Visit (INDEPENDENT_AMBULATORY_CARE_PROVIDER_SITE_OTHER): Payer: BC Managed Care – PPO

## 2020-07-21 ENCOUNTER — Other Ambulatory Visit: Payer: Self-pay

## 2020-07-21 VITALS — BP 138/84 | HR 60 | Ht 61.0 in | Wt 181.0 lb

## 2020-07-21 DIAGNOSIS — R42 Dizziness and giddiness: Secondary | ICD-10-CM | POA: Diagnosis not present

## 2020-07-21 DIAGNOSIS — I10 Essential (primary) hypertension: Secondary | ICD-10-CM

## 2020-07-21 NOTE — Telephone Encounter (Signed)
Cardiologist sent me note on this pt. I looked and can't find office visit with me. I took myself off as her pcp. Can you find that I saw her. If she is going to be my pt then please get her scheduled.  Let me know what you find out.

## 2020-07-21 NOTE — Progress Notes (Signed)
Cardiology Office Note:    Date:  07/21/2020   ID:  Julieann Drummonds, DOB [birth year], MRN [id number]  PCP:  Mackie Pai, PA-C  Cardiologist:  Berniece Salines, DO  Electrophysiologist:  None   Referring MD: No ref. provider found  After having some dizziness.  History of Present Illness:    Latoya Rios is a 64 y.o. female with a hx of hypertension, here today for follow-up visit. Saw the patient in October 2021 at that time she was hypotensive therefore I decreased her hydralazine and stopped the Aldactone. As the patient to follow-up in a month.   Since I last saw the patient she was hospitalized for acute on chronic kidney injury as well as low blood pressures. Her medication were changed and she was taking hydrochlorothiazide as well as Aldactone.  Today she tells me that she has been feeling dizzy when she first woke up in the morning. She is concerned about this. She denies any chest pain or shortness of breath.  Past Medical History:  Diagnosis Date  . Abnormal renal function 03/01/2019   Formatting of this note is different from the original. eGFR  >=60.0 mL/min/1.42m2 >60.0  31.2Low  CM  >60.0 CM     Last Assessment & Plan:  Formatting of this note might be different from the original. Resolved as of last labs Rechecking labs to ensure stability  . Accelerated hypertension 06/08/2020  . Acute hypoxemic respiratory failure due to COVID-19 ([facility] 06/08/2020  . Benign essential hypertension 05/29/2016   Last Assessment & Plan:  Formatting of this note is different from the original. Pertinent Data:   Current medication includes: carvediloL - 3.125 mg losartan-hydroCHLOROthiazide - 100-25 mg .  BP Readings from Last 3 Encounters:  02/29/20 (!) 206/86  08/31/19 (!) 142/78  08/16/19 (!) 160/56   LDL Calculated (mg/dL)  Date Value  08/31/2019 144 (H)  03/01/2019 156 (H)   Creatinine (mg/dL)  Date Val  . CHF (congestive heart failure) ([facility]   . Class 2 severe obesity due to excess calories with  serious comorbidity and body mass index (BMI) of 37.0 to 37.9 in adult ([facility] 05/29/2016   Formatting of this note is different from the original. Wt Readings from Last 3 Encounters:  08/31/19 233 lb  08/16/19 239 lb  03/01/19 249 lb    Last Assessment & Plan:  Formatting of this note might be different from the original. BMI Assessment: Current Body mass index is 37.22 kg/m.  Patient BMI currently is above average (>25 kg/m2); BMI follow up plan is completed . Current barriers to heal  . Demand ischemia ([facility] 06/08/2020  . Drug declined by patient 02/29/2020   Last Assessment & Plan:  Formatting of this note might be different from the original. Patient historically has declined statins and at some point was on losartan/HCTZ and carvedilol, but stopped taking them stating " I dont want to take medications ".  Today I again discussed with patient the risks of uncontrolled HYPERLIPIDEMIA and HYPERTENSION including but not limited to strokes, MIs, renal in  . Encounter for preventative adult health care exam with abnormal findings 06/12/2017   Last Assessment & Plan:  Formatting of this note might be different from the original. Patient for wellness visit.  Overall doing good. No new concerns. Reviewed appropriate age screenings and vaccinations.  Influenza vaccine: Patient Declined Tdap/TD: Patient Declined Zoster vaccine: Patient Declined  Colonoscopy: UTD, due 2022 Mammogram: UTD  Depression screening: negative No falls reported  Dis  .  Gynecologic exam normal 08/16/2019   Last Assessment & Plan:  Formatting of this note might be different from the original. Pap smear with high risk hpv collected for screening Discussed and encouraged healthy lifestyle.  Always use sunscreen when sun exposed.  Encouraged healthy diet with fresh fruits, vegetables, and lean meat.  Encouraged regular exercise for heart health and bone health.  Encouraged continue monthly SBE and yearl  . Heart murmur   . Hypertension   . Mixed  hyperlipidemia   . Obese   . Obesity (BMI 30-39.9) 06/08/2020  . Pneumonia due to COVID-19 virus 06/08/2020    Past Surgical History:  Procedure Laterality Date  . COLONOSCOPY    . DENTAL SURGERY      Current Medications: Current Meds  Medication Sig  . ascorbic acid (VITAMIN C) 500 MG tablet Take 1 tablet (500 mg total) by mouth daily.  Marland Kitchen aspirin EC 81 MG EC tablet Take 1 tablet (81 mg total) by mouth daily. Swallow whole.  . carvedilol (COREG) 3.125 MG tablet Take 1 tablet (3.125 mg total) by mouth 2 (two) times daily with a meal.  . losartan (COZAAR) 50 MG tablet Take 1 tablet (50 mg total) by mouth daily.  Marland Kitchen zinc sulfate 220 (50 Zn) MG capsule Take 1 capsule (220 mg total) by mouth daily.     Allergies:   Clonidine, Nisoldipine, Penicillins, Quinacrine, and Quinapril   Social History   Socioeconomic History  . Marital status: Widowed    Spouse name: Not on file  . Number of children: Not on file  . Years of education: Not on file  . Highest education level: Not on file  Occupational History  . Not on file  Tobacco Use  . Smoking status: Never Smoker  . Smokeless tobacco: Never Used  Substance and Sexual Activity  . Alcohol use: Never  . Drug use: Never  . Sexual activity: Not on file  Other Topics Concern  . Not on file  Social History Narrative  . Not on file   Social Determinants of Health   Financial Resource Strain:   . Difficulty of Paying Living Expenses: Not on file  Food Insecurity:   . Worried About Charity fundraiser in the Last Year: Not on file  . Ran Out of Food in the Last Year: Not on file  Transportation Needs:   . Lack of Transportation (Medical): Not on file  . Lack of Transportation (Non-Medical): Not on file  Physical Activity:   . Days of Exercise per Week: Not on file  . Minutes of Exercise per Session: Not on file  Stress:   . Feeling of Stress : Not on file  Social Connections:   . Frequency of Communication with Friends and  Family: Not on file  . Frequency of Social Gatherings with Friends and Family: Not on file  . Attends Religious Services: Not on file  . Active Member of Clubs or Organizations: Not on file  . Attends Archivist Meetings: Not on file  . Marital Status: Not on file     Family History: The patient's family history includes Brain cancer in her father; Emphysema in her mother; Lung disease in her mother.  ROS:   Review of Systems  Constitution: Negative for decreased appetite, fever and weight gain.  HENT: Negative for congestion, ear discharge, hoarse voice and sore throat.   Eyes: Negative for discharge, redness, vision loss in right eye and visual halos.  Cardiovascular: Negative for chest  pain, dyspnea on exertion, leg swelling, orthopnea and palpitations.  Respiratory: Negative for cough, hemoptysis, shortness of breath and snoring.   Endocrine: Negative for heat intolerance and polyphagia.  Hematologic/Lymphatic: Negative for bleeding problem. Does not bruise/bleed easily.  Skin: Negative for flushing, nail changes, rash and suspicious lesions.  Musculoskeletal: Negative for arthritis, joint pain, muscle cramps, myalgias, neck pain and stiffness.  Gastrointestinal: Negative for abdominal pain, bowel incontinence, diarrhea and excessive appetite.  Genitourinary: Negative for decreased libido, genital sores and incomplete emptying.  Neurological: Negative for brief paralysis, focal weakness, headaches and loss of balance.  Psychiatric/Behavioral: Negative for altered mental status, depression and suicidal ideas.  Allergic/Immunologic: Negative for HIV exposure and persistent infections.    EKGs/Labs/Other Studies Reviewed:    The following studies were reviewed today:   EKG: None today  Echocardiogram  IMPRESSIONS June 10, 2020 1. Left ventricular ejection fraction, by estimation, is 60 to 65%. The left ventricle has normal function. The left ventricle has no  regional wall motion abnormalities. Left ventricular diastolic parameters are consistent with Grade I diastolic  dysfunction (impaired relaxation).  2. Right ventricular systolic function is normal. The right ventricular size is normal. Tricuspid regurgitation signal is inadequate for assessing PA pressure.  3. The mitral valve is grossly normal. Mild mitral valve regurgitation. No evidence of mitral stenosis.  4. The aortic valve is grossly normal. Aortic valve regurgitation is not visualized. No aortic stenosis is present.  5. The inferior vena cava is normal in size with <50% respiratory variability, suggesting right atrial pressure of 8 mmHg.   Recent Labs: 06/08/2020: B Natriuretic Peptide 297.3 06/24/2020: ALT 24; Magnesium 2.6 06/25/2020: BUN 47; Creatinine, Ser 0.75; Hemoglobin 13.6; Platelets 326; Potassium 4.1; Sodium 136  Recent Lipid Panel    Component Value Date/Time   TRIG 84 06/08/2020 0225    Physical Exam:    VS:  BP 138/84   Pulse 60   Ht '5\' 1"'  (1.549 m)   Wt 181 lb (82.1 kg)   SpO2 98%   BMI 34.20 kg/m     Wt Readings from Last 3 Encounters:  07/21/20 181 lb (82.1 kg)  06/25/20 176 lb 2.4 oz (79.9 kg)  06/08/20 205 lb 14.6 oz (93.4 kg)     GEN: Well nourished, well developed in no acute distress HEENT: Normal NECK: No JVD; No carotid bruits LYMPHATICS: No lymphadenopathy CARDIAC: S1S2 noted,RRR, no murmurs, rubs, gallops RESPIRATORY:  Clear to auscultation without rales, wheezing or rhonchi  ABDOMEN: Soft, non-tender, non-distended, +bowel sounds, no guarding. EXTREMITIES: No edema, No cyanosis, no clubbing MUSCULOSKELETAL:  No deformity  SKIN: Warm and dry NEUROLOGIC:  Alert and oriented x 3, non-focal PSYCHIATRIC:  Normal affect, good insight  ASSESSMENT:    1. Hypertension, unspecified type   2. Dizziness    PLAN:     1. Her blood pressure deceptively in the office today. She does have some occasional systolic blood pressure which seems to  be in the 140s and 150s. But there is concerned as the patient tells me usually in the morning when she wakes up she feels significantly lightheaded and dizzy. I do like to make sure that orthostatic hypotension is not a factor with this patient. Unfortunately she is unable to get blood pressure cuff. I am going to reach out to our social work team and see if we have any donated blood pressure cuff to be able to extend 1 to this patient who certainly needed. Once she has a blood pressure cuff I  have asked the patient to take her blood pressure in the morning while standing up to make sure that her systolic is not dropping. I have also educated her that if she wakes up first in the morning she should see that for her bed sit down and that she is wake up from lying in quickly after standing position. She should do this gradually.  We will make sure that recently exacerbated. We will place a monitor the patient for 7 days.  The patient is in agreement with the above plan. The patient left the office in stable condition.  The patient will follow up in 8 weeks or sooner if needed.   Medication Adjustments/Labs and Tests Ordered: Current medicines are reviewed at length with the patient today.  Concerns regarding medicines are outlined above.  Orders Placed This Encounter  Procedures  . LONG TERM MONITOR (3-14 DAYS)   No orders of the defined types were placed in this encounter.   Patient Instructions  Medication Instructions:  *If you need a refill on your cardiac medications before your next appointment, please call your pharmacy*  Testing/Procedures: Your physician has recommended that you wear a 7 day ZIO monitor. ZIO monitors are medical devices that record the heart's electrical activity. Doctors most often use these monitors to diagnose arrhythmias. Arrhythmias are problems with the speed or rhythm of the heartbeat. The monitor is a small, portable device. You can wear one while you do your  normal daily activities. This is usually used to diagnose what is causing palpitations/syncope (passing out).  Follow-Up: At [facility], you and your health needs are our priority.  As part of our continuing mission to provide you with exceptional heart care, we have created designated Provider Care Teams.  These Care Teams include your primary Cardiologist (physician) and Advanced Practice Providers (APPs -  Physician Assistants and Nurse Practitioners) who all work together to provide you with the care you need, when you need it.  We recommend signing up for the patient portal called "MyChart".  Sign up information is provided on this After Visit Summary.  MyChart is used to connect with patients for Virtual Visits (Telemedicine).  Patients are able to view lab/test results, encounter notes, upcoming appointments, etc.  Non-urgent messages can be sent to your provider as well.   To learn more about what you can do with MyChart, go to [email or web address].    Your next appointment:   Your physician recommends that you schedule a follow-up appointment in: 66 WEEKS with Dr. Harriet Masson  The format for your next appointment:   In Person with Berniece Salines, DO        Adopting a Healthy Lifestyle.  Know what a healthy weight is for you (roughly BMI <25) and aim to maintain this   Aim for 7+ servings of fruits and vegetables daily   65-80+ fluid ounces of water or unsweet tea for healthy kidneys   Limit to max 1 drink of alcohol per day; avoid smoking/tobacco   Limit animal fats in diet for cholesterol and heart health - choose grass fed whenever available   Avoid highly processed foods, and foods high in saturated/trans fats   Aim for low stress - take time to unwind and care for your mental health   Aim for 150 min of moderate intensity exercise weekly for heart health, and weights twice weekly for bone health   Aim for 7-9 hours of sleep daily   When it comes to diets, agreement  about  the perfect plan isnt easy to find, even among the experts. Experts at the [employer] developed an idea known as the Healthy Eating Plate. Just imagine a plate divided into logical, healthy portions.   The emphasis is on diet quality:   Load up on vegetables and fruits - one-half of your plate: Aim for color and variety, and remember that potatoes dont count.   Go for whole grains - one-quarter of your plate: Whole wheat, barley, wheat berries, quinoa, oats, brown rice, and foods made with them. If you want pasta, go with whole wheat pasta.   Protein power - one-quarter of your plate: Fish, chicken, beans, and nuts are all healthy, versatile protein sources. Limit red meat.   The diet, however, does go beyond the plate, offering a few other suggestions.   Use healthy plant oils, such as olive, canola, soy, corn, sunflower and peanut. Check the labels, and avoid partially hydrogenated oil, which have unhealthy trans fats.   If youre thirsty, drink water. Coffee and tea are good in moderation, but skip sugary drinks and limit milk and dairy products to one or two daily servings.   The type of carbohydrate in the diet is more important than the amount. Some sources of carbohydrates, such as vegetables, fruits, whole grains, and beans-are healthier than others.   Finally, stay active  Signed, Berniece Salines, DO  07/21/2020 2:52 PM    [place]

## 2020-07-21 NOTE — Patient Instructions (Signed)
Medication Instructions:  *If you need a refill on your cardiac medications before your next appointment, please call your pharmacy*  Testing/Procedures: Your physician has recommended that you wear a 7 day ZIO monitor. ZIO monitors are medical devices that record the heart's electrical activity. Doctors most often use these monitors to diagnose arrhythmias. Arrhythmias are problems with the speed or rhythm of the heartbeat. The monitor is a small, portable device. You can wear one while you do your normal daily activities. This is usually used to diagnose what is causing palpitations/syncope (passing out).  Follow-Up: At [facility], you and your health needs are our priority.  As part of our continuing mission to provide you with exceptional heart care, we have created designated Provider Care Teams.  These Care Teams include your primary Cardiologist (physician) and Advanced Practice Providers (APPs -  Physician Assistants and Nurse Practitioners) who all work together to provide you with the care you need, when you need it.  We recommend signing up for the patient portal called "MyChart".  Sign up information is provided on this After Visit Summary.  MyChart is used to connect with patients for Virtual Visits (Telemedicine).  Patients are able to view lab/test results, encounter notes, upcoming appointments, etc.  Non-urgent messages can be sent to your provider as well.   To learn more about what you can do with MyChart, go to [email or web address].    Your next appointment:   Your physician recommends that you schedule a follow-up appointment in: 8 WEEKS with Dr. Servando Salina  The format for your next appointment:   In Person with Thomasene Ripple, DO

## 2020-07-24 ENCOUNTER — Telehealth: Payer: Self-pay | Admitting: Licensed Clinical Social Worker

## 2020-07-24 NOTE — Telephone Encounter (Signed)
CSW referred to assist patient with obtaining a BP cuff. CSW contacted patient to inform cuff will be delivered to home. Patient grateful for support and assistance. CSW available as needed. Jackie Terie Lear, LCSW, CCSW-MCS [phone]  

## 2020-07-24 NOTE — Telephone Encounter (Signed)
Pt is going to see you tomorrow, 07/25/2020, to establish care with you.

## 2020-07-25 ENCOUNTER — Encounter: Payer: Self-pay | Admitting: Medical

## 2020-07-25 ENCOUNTER — Telehealth: Payer: Self-pay | Admitting: Medical

## 2020-07-25 ENCOUNTER — Other Ambulatory Visit: Payer: Self-pay

## 2020-07-25 ENCOUNTER — Ambulatory Visit (INDEPENDENT_AMBULATORY_CARE_PROVIDER_SITE_OTHER): Payer: BC Managed Care – PPO | Admitting: Medical

## 2020-07-25 VITALS — BP 180/70 | HR 62 | Temp 97.4°F | Resp 16 | Ht 61.0 in | Wt 182.4 lb

## 2020-07-25 DIAGNOSIS — R7989 Other specified abnormal findings of blood chemistry: Secondary | ICD-10-CM | POA: Diagnosis not present

## 2020-07-25 DIAGNOSIS — Z1231 Encounter for screening mammogram for malignant neoplasm of breast: Secondary | ICD-10-CM

## 2020-07-25 DIAGNOSIS — Z23 Encounter for immunization: Secondary | ICD-10-CM | POA: Diagnosis not present

## 2020-07-25 DIAGNOSIS — I10 Essential (primary) hypertension: Secondary | ICD-10-CM | POA: Diagnosis not present

## 2020-07-25 DIAGNOSIS — R944 Abnormal results of kidney function studies: Secondary | ICD-10-CM

## 2020-07-25 DIAGNOSIS — I509 Heart failure, unspecified: Secondary | ICD-10-CM

## 2020-07-25 DIAGNOSIS — Z1211 Encounter for screening for malignant neoplasm of colon: Secondary | ICD-10-CM | POA: Diagnosis not present

## 2020-07-25 DIAGNOSIS — Z124 Encounter for screening for malignant neoplasm of cervix: Secondary | ICD-10-CM

## 2020-07-25 LAB — MAGNESIUM: Magnesium: 1.6 mg/dL (ref 1.5–2.5)

## 2020-07-25 LAB — COMPREHENSIVE METABOLIC PANEL
ALT: 78 U/L — ABNORMAL HIGH (ref 0–35)
AST: 59 U/L — ABNORMAL HIGH (ref 0–37)
Albumin: 3.6 g/dL (ref 3.5–5.2)
Alkaline Phosphatase: 73 U/L (ref 39–117)
BUN: 13 mg/dL (ref 6–23)
CO2: 29 mEq/L (ref 19–32)
Calcium: 9 mg/dL (ref 8.4–10.5)
Chloride: 103 mEq/L (ref 96–112)
Creatinine, Ser: 0.53 mg/dL (ref 0.40–1.20)
GFR: 97.78 mL/min (ref >60.00)
Glucose, Bld: 75 mg/dL (ref 70–99)
Potassium: 3.9 mEq/L (ref 3.5–5.1)
Sodium: 138 mEq/L (ref 135–145)
Total Bilirubin: 0.6 mg/dL (ref 0.2–1.2)
Total Protein: 6.4 g/dL (ref 6.0–8.3)

## 2020-07-25 LAB — BRAIN NATRIURETIC PEPTIDE: Pro B Natriuretic peptide (BNP): 93 pg/mL (ref 0.0–100.0)

## 2020-07-25 NOTE — Patient Instructions (Addendum)
Nice to meet you today.  Your blood pressure is elevated today at around 180/70.  Your blood pressure at home is have trended upward but last Friday BP reading with cardiologist was actually good reading.  Good rate present want you to increase losartan 100mg  daily.  Go ahead and take additional 50 mg this afternoon since you have already taken 1 tablet this morning.  We will follow your lab results today and try to send results of those to the cardiologist to see if she has other recommendation for BP management.  Presently want to hold off on diuretics until evaluate kidney function.  Continue Coreg.  History of intermittent dizziness and Zio patch results pending.  History of some CHF per chart review.  Echo showed good ejection fraction.  Will get BMP today.  Also check CMP.  History of low vitamin D.  Check vitamin D level today.  Place referral for screening colonoscopy and screening Pap smear.   Time spent with patient today was   minutes which consisted of chart revdiew, discussing diagnosis, work up treatment and documentation. Also placed mammogram order screening of breast cancer.   You have elevated magnesium bilaterally.  Will check magnesium level today.  Follow-up in 2 weeks or as needed.

## 2020-07-25 NOTE — Addendum Note (Signed)
Addended by: Maximino Sarin on: 07/25/2020 02:21 PM   Modules accepted: Orders

## 2020-07-25 NOTE — Telephone Encounter (Signed)
Pt records on you desk. I put in referral to gyn and gi. Her most recent pap and mammogram are in records. Gyn and radiology would need those reports before studies done. Can you help coordinate and get those to them.   If you can give me back the records after you copy so we can place to scan.

## 2020-07-25 NOTE — Progress Notes (Signed)
Subjective:    Patient ID: Latoya Rios, female    DOB: 1956-04-25, 64 y.o.   MRN: [id number]  HPI  Pt in for first time.  Pt from [facility]. Pt visiting daughter and plans to return home. She was was here to be closely followed by daughter post covid.   Pt live about 83 miles from her home. Making this her primary care.  Pt was dx with  pneumonia end of September. Then was quarantined. Then during quarantine she got had other medical issues listed below.  Discharge Diagnosis:   Principal Problem:   Acute kidney injury ([place]) Active Problems:   Hypertension   Acute cystitis without hematuria   ARF (acute renal failure) ([facility])   AKI (acute kidney injury) ([place])     History of Present Illness / Brief narrative:  Latoya Rios a 64 y.o.femalewith PMH of hypertension,recently hospitalizedfor COVID-19 pneumonia (9/23-9/27)andcompleted a course [facility]17/[facility].  Patient was previously hypertensive but she was weaned off medicationsin December 2020 after her blood pressure improved with weight loss.In last hospitalization for Covid, she was found to be in hypertensive urgency and hence her previous medications were resumed. Since discharge, patient was feeling weak and also hallucinating at times. Her blood pressure readings at home were close to 100. 10/7, she visited cardiologist Dr. Adline Potter uncontrolled hypertension. Medications were adjusted. Lab work was sent which resulted on 10/8 showing acute kidney injury and leukocytosis and hence patient was sent to ED.  In the ED, patient was afebrile, heart rate in 70s. She wasinitially hypotensive with systolic blood pressure in the 80s for which bolus was given with subsequent improvement in blood pressure. EKGshowednormal sinus rhythm with heart rate of 62 bpm, no ST-T wave changes.  High sensory troponins were negative.  Chest x-rayshowed significant improved aeration bilaterally.    Urinalysis showed cloudy yellow urine with large amount of leukocytes, positive nitrite and many bacteria. Lab work showed WBC count of 16.1, creatinine elevated to 1.58, sodium 128. Patient was admitted for further evaluation management.  Subjective:  Seen and examined this morning.  Pleasant elderly female. Not in distress.  Feels good.  Sitting up in chair.  Feels ready to go home. I had a long conversation with her at bedside and her daughter on the phone about patient's clinical condition and the plan.  Hospital Course:  Acute kidney injury -Multifactorial:Poor oral intake, combination of losartan, HCTZ, Aldactone and low blood pressure. -Renal ultrasound unremarkable. Recent Labs (within last 365 days)           Recent Labs    06/08/20 0225 06/11/20 0548 06/12/20 0457 06/22/20 1444 06/23/20 1736 06/24/20 0443 06/25/20 0403  BUN 17 28* 27* 78* 110* 86* 47*  CREATININE 0.71 0.63 0.68 1.47* 1.58* 0.98 0.75     Essential hypertension -9/27, she was discharged on Coreg, losartan, HCTZ, Aldactone, hydralazine -10/7, she followed up with cardiologist Dr. Harriet Masson.Recommendation at that time was to stop Aldactone, decrease hydralazine to 50 mg daily, and continue Coreg at 3.125 mg twice daily, losartan 100 mg daily and HCTZ 25 mg daily.  -While in the hospital, most of her medicines were held.  She was adequately hydrated. -Her blood pressure is now improved.  Will discharge her on Coreg 3.125 mg twice daily and losartan 50 mg daily.  Admission the patient's daughter to keep her blood pressure monitored at home.  If the blood pressure exceeds 297 systolic, losartan dose should be increased to 100 mg daily.   -  Continue to follow with PCP as an outpatient. -Echocardiogram 9/24 showed EF of 60 to 65% and grade 1 diastolic dysfunction  Hyponatremia -Again likely secondary to poor oral intake and use of diuretics. -Improving sodium level. Continue to monitor. Last Labs          Recent Labs  Lab 06/22/20 1444 06/23/20 1736 06/24/20 0443 06/25/20 0403  NA 129* 128* 132* 136     UTI -Urinalysis showed cloudy yellow urine with large amount of leukocytes, positive nitrite and many bacteria. -Currently on IV Rocephin.  Urine culture is growing more than 100,000 CFU per mL of gram-negative rod. -We will discharge home oral Omnicef for next 5 days with probiotics.  Generalized weakness -Combination of low blood pressure, UTI, AKI, poor oral intake -PT eval obtained.  No follow-up required.  RecentCovid pneumonia -Improvement in chest x-ray noted.Not on supplemental oxygen -I recommended her to get vaccine after 3 weeks of last positive test result.  Patient states she will consider it.  Stable for discharge to home today.     Pt has seen Dr. Harriet Masson for follow up. She is on zio patch. Pt is only on coreg 3.125 bid and losartan 50 mg daily.  Pt bp high initially here today. No cardiac or neurologic signs or symptoms.   Pt last 8 bp readings 145/65, 144/61, 153/67, 159/64, 166/73, 152/63, 162/64 and 145/68.  Today at home 162/64.  Last Friday at cardilogist office 138/84.   Pt was discharged from hospital for about month. Pt has felt progressivley better since dc.  Pt only had occasional dizziness which seems to be getting better. Per pt this is why she has zio patch on.   Pt last labs one month ago looked ok but last gfr was low. Her magnesium.    Pt states due for mammogram Aug 24, 2021 Hx of some abnormals in the past.  Also due for papsmear Aug 17, 2021  Pt last colonoscopy done in 2012. I can't find report in records but found normal note in [facility]  Pt declines flu vaccine.   Review of Systems  Constitutional: Negative for chills, fatigue and fever.  Respiratory: Negative for cough, chest tightness, shortness of breath and wheezing.   Cardiovascular: Negative for chest pain and palpitations.  Gastrointestinal: Negative for  abdominal pain, diarrhea and vomiting.  Musculoskeletal: Negative for back pain, myalgias and neck stiffness.  Skin: Negative for rash.  Neurological: Negative for dizziness, speech difficulty, weakness, numbness and headaches.  Hematological: Negative for adenopathy. Does not bruise/bleed easily.  Psychiatric/Behavioral: Negative for behavioral problems and decreased concentration. The patient is not nervous/anxious.     Past Medical History:  Diagnosis Date  . Abnormal renal function 03/01/2019   Formatting of this note is different from the original. eGFR  >=60.0 mL/min/1.50m2 >60.0  31.2Low  CM  >60.0 CM     Last Assessment & Plan:  Formatting of this note might be different from the original. Resolved as of last labs Rechecking labs to ensure stability  . Accelerated hypertension 06/08/2020  . Acute hypoxemic respiratory failure due to COVID-19 ([place] 06/08/2020  . Benign essential hypertension 05/29/2016   Last Assessment & Plan:  Formatting of this note is different from the original. Pertinent Data:   Current medication includes: carvediloL - 3.125 mg losartan-hydroCHLOROthiazide - 100-25 mg .  BP Readings from Last 3 Encounters:  02/29/20 (!) 206/86  08/31/19 (!) 142/78  08/16/19 (!) 160/56   LDL Calculated (mg/dL)  Date Value  08/31/2019 144 (  H)  03/01/2019 156 (H)   Creatinine (mg/dL)  Date Val  . CHF (congestive heart failure) ([place])   . Class 2 severe obesity due to excess calories with serious comorbidity and body mass index (BMI) of 37.0 to 37.9 in adult [facility]) 05/29/2016   Formatting of this note is different from the original. Wt Readings from Last 3 Encounters:  08/31/19 233 lb  08/16/19 239 lb  03/01/19 249 lb    Last Assessment & Plan:  Formatting of this note might be different from the original. BMI Assessment: Current Body mass index is 37.22 kg/m.  Patient BMI currently is above average (>25 kg/m2); BMI follow up plan is completed . Current barriers to heal  . Demand ischemia  ([place]) 06/08/2020  . Drug declined by patient 02/29/2020   Last Assessment & Plan:  Formatting of this note might be different from the original. Patient historically has declined statins and at some point was on losartan/HCTZ and carvedilol, but stopped taking them stating " I dont want to take medications ".  Today I again discussed with patient the risks of uncontrolled HYPERLIPIDEMIA and HYPERTENSION including but not limited to strokes, MIs, renal in  . Encounter for preventative adult health care exam with abnormal findings 06/12/2017   Last Assessment & Plan:  Formatting of this note might be different from the original. Patient for wellness visit.  Overall doing good. No new concerns. Reviewed appropriate age screenings and vaccinations.  Influenza vaccine: Patient Declined Tdap/TD: Patient Declined Zoster vaccine: Patient Declined  Colonoscopy: UTD, due 2022 Mammogram: UTD  Depression screening: negative No falls reported  Dis  . Gynecologic exam normal 08/16/2019   Last Assessment & Plan:  Formatting of this note might be different from the original. Pap smear with high risk hpv collected for screening Discussed and encouraged healthy lifestyle.  Always use sunscreen when sun exposed.  Encouraged healthy diet with fresh fruits, vegetables, and lean meat.  Encouraged regular exercise for heart health and bone health.  Encouraged continue monthly SBE and yearl  . Heart murmur   . Hypertension   . Mixed hyperlipidemia   . Obese   . Obesity (BMI 30-39.9) 06/08/2020  . Pneumonia due to COVID-19 virus 06/08/2020     Social History   Socioeconomic History  . Marital status: Widowed    Spouse name: Not on file  . Number of children: Not on file  . Years of education: Not on file  . Highest education level: Not on file  Occupational History  . Not on file  Tobacco Use  . Smoking status: Never Smoker  . Smokeless tobacco: Never Used  Substance and Sexual Activity  . Alcohol use: Never  . Drug  use: Never  . Sexual activity: Not on file  Other Topics Concern  . Not on file  Social History Narrative  . Not on file   Social Determinants of Health   Financial Resource Strain:   . Difficulty of Paying Living Expenses: Not on file  Food Insecurity:   . Worried About Charity fundraiser in the Last Year: Not on file  . Ran Out of Food in the Last Year: Not on file  Transportation Needs:   . Lack of Transportation (Medical): Not on file  . Lack of Transportation (Non-Medical): Not on file  Physical Activity:   . Days of Exercise per Week: Not on file  . Minutes of Exercise per Session: Not on file  Stress:   . Feeling of  Stress : Not on file  Social Connections:   . Frequency of Communication with Friends and Family: Not on file  . Frequency of Social Gatherings with Friends and Family: Not on file  . Attends Religious Services: Not on file  . Active Member of Clubs or Organizations: Not on file  . Attends Archivist Meetings: Not on file  . Marital Status: Not on file  Intimate Partner Violence:   . Fear of Current or Ex-Partner: Not on file  . Emotionally Abused: Not on file  . Physically Abused: Not on file  . Sexually Abused: Not on file    Past Surgical History:  Procedure Laterality Date  . COLONOSCOPY    . DENTAL SURGERY      Family History  Problem Relation Age of Onset  . Emphysema Mother   . Lung disease Mother   . Brain cancer Father     Allergies  Allergen Reactions  . Clonidine Other (See Comments)    unk  . Nisoldipine Other (See Comments)    unk  . Penicillins Other (See Comments)    unk  . Quinacrine Other (See Comments)    unk  . Quinapril Other (See Comments)    unk    Current Outpatient Medications on File Prior to Visit  Medication Sig Dispense Refill  . ascorbic acid (VITAMIN C) 500 MG tablet Take 1 tablet (500 mg total) by mouth daily. 30 tablet 0  . aspirin EC 81 MG EC tablet Take 1 tablet (81 mg total) by mouth  daily. Swallow whole. 30 tablet 11  . carvedilol (COREG) 3.125 MG tablet Take 1 tablet (3.125 mg total) by mouth 2 (two) times daily with a meal. 120 tablet 0  . losartan (COZAAR) 50 MG tablet Take 1 tablet (50 mg total) by mouth daily. 60 tablet 0  . zinc sulfate 220 (50 Zn) MG capsule Take 1 capsule (220 mg total) by mouth daily. 30 capsule 0   No current facility-administered medications on file prior to visit.    BP (!) 183/57   Pulse 62   Temp (!) 97.4 F (36.3 C) (Oral)   Resp 16   Ht '5\' 1"'  (1.549 m)   Wt 182 lb 6.4 oz (82.7 kg)   SpO2 100%   BMI 34.46 kg/m       Objective:   Physical Exam  General Mental Status- Alert. General Appearance- Not in acute distress.   Skin General: Color- Normal Color. Moisture- Normal Moisture.  Neck Carotid Arteries- Normal color. Moisture- Normal Moisture. No carotid bruits. No JVD.  Chest and Lung Exam Auscultation: Breath Sounds:-Normal.  Cardiovascular Auscultation:Rythm- Regular. Murmurs & Other Heart Sounds:Auscultation of the heart reveals- No Murmurs.  Abdomen Inspection:-Inspeection Normal. Palpation/Percussion:Note:No mass. Palpation and Percussion of the abdomen reveal- Non Tender, Non Distended + BS, no rebound or guarding.    Neurologic Cranial Nerve exam:- CN III-XII intact(No nystagmus), symmetric smile. Drift Test:- No drift. Finger to Nose:- Normal/Intact Strength:- 5/5 equal and symmetric strength both upper and lower extremities.  Lower no pedal edema.    Assessment & Plan:  Nice to meet you today.  Your blood pressure is elevated today at around 180/70.  Your blood pressure at home is have trended upward but last Friday BP reading with cardiologist was actually good reading.  Good rate present want you to increase losartan 141m daily.  Go ahead and take additional 50 mg this afternoon since you have already taken 1 tablet this morning.  We  will follow your lab results today and try to send results of  those to the cardiologist to see if she has other recommendation for BP management.  Presently want to hold off on diuretics until evaluate kidney function.  Continue Coreg.  History of intermittent dizziness and Zio patch results pending.  History of some CHF per chart review.  Echo showed good ejection fraction.  Will get BMP today.  Also check CMP.  History of low vitamin D.  Check vitamin D level today.  Place referral for screening colonoscopy and screening Pap smear.  Also placed mammogram order screening of breast cancer.   You have elevated magnesium bilaterally.  Will check magnesium level today.  Follow-up in 2 weeks or as needed.  Time spent with patient today was  54 minutes which consisted of chart review, discussing diagnoses, work up treatment and documentation.

## 2020-07-26 ENCOUNTER — Telehealth: Payer: Self-pay | Admitting: Medical

## 2020-07-26 MED ORDER — LOSARTAN POTASSIUM 50 MG PO TABS: ORAL_TABLET | ORAL | 0 refills | Status: DC

## 2020-07-26 NOTE — Telephone Encounter (Signed)
New rx losartan sent to pt pharmacy.

## 2020-07-29 LAB — VITAMIN D 1,25 DIHYDROXY
Vitamin D 1, 25 (OH)2 Total: 49 pg/mL (ref 18–72)
Vitamin D2 1, 25 (OH)2: <8 pg/mL
Vitamin D3 1, 25 (OH)2: 49 pg/mL

## 2020-08-02 ENCOUNTER — Telehealth: Payer: Self-pay | Admitting: Medical

## 2020-08-02 NOTE — Telephone Encounter (Signed)
Caller : [employer] rep  Called in reference to giving information on patient's nurse  Coordinator at [employer].   Nurse is Harvie Bridge can be contacted at [phone]

## 2020-08-08 ENCOUNTER — Encounter: Payer: Self-pay | Admitting: Medical

## 2020-08-08 ENCOUNTER — Ambulatory Visit (INDEPENDENT_AMBULATORY_CARE_PROVIDER_SITE_OTHER): Payer: BC Managed Care – PPO | Admitting: Medical

## 2020-08-08 ENCOUNTER — Other Ambulatory Visit: Payer: Self-pay

## 2020-08-08 VITALS — BP 190/80 | HR 60 | Temp 98.2°F | Resp 18 | Ht 61.0 in | Wt 183.8 lb

## 2020-08-08 DIAGNOSIS — I10 Essential (primary) hypertension: Secondary | ICD-10-CM

## 2020-08-08 MED ORDER — CHLORTHALIDONE 25 MG PO TABS: 25.0000 mg | ORAL_TABLET | Freq: Every day | ORAL | 1 refills | Status: DC

## 2020-08-08 NOTE — Patient Instructions (Signed)
Your blood pressures at home are still high despite increasing losartan to 100 mg daily and using Coreg 3.125 mg twice daily.  Her blood pressures were very high today in the office.  Most recent blood pressure reading today was 136/64.  Would not expect both to be getting at add chlorthalidone 25 mg to your regimen.  Your blood pressure today does not match readings over the last 2 to 3 weeks.  Questioning whether or not you might have whitecoat hypertension making your blood pressure worse today.  Versus your machine giving inaccurate readings.  However you have 2 relatively new BP machines at home so would not expect both to be giving inaccurate readings.  Start chlorthalidone today when you feel the prescription.  Want to schedule nurse blood pressure check tomorrow and bring your blood pressure cuff.  Need to verify accuracy of your machine.  If you have any cardiac or neurologic signs symptoms then recommend ED evaluation.

## 2020-08-08 NOTE — Progress Notes (Addendum)
Subjective:    Patient ID: Latoya Rios, female    DOB: February 19, 1956, 64 y.o.   MRN: [id number]  HPI  Pt in for follow up.  Pt has htn. She brings readings over past 2-3 weeks. Half of her reading are in 150/60-70 range. Pt is on losartan 100 mg daily and coreg 3.125 mg twice a day.  First bp check today by MA with machine was 197/74  Pt has no cardiac or neurologic signs or symptoms.  Pt states she has bp at home. Not sure how old it is.  Pt has new bp machines thru cardiologist and daughter has new machine.   Pt has not had any decongestants or any caffeine.   At home BP readings have been 144/61, 159/64, 152/63, 145/68, 153/75, 158/64, 152/68, 166/72, 146/68, 149/70, 156/63, 155/68, 155/67, 161/77, 149/61, 167/70, 165/70, and 136/64,       Review of Systems  Constitutional: Negative for chills, fatigue and fever.  Respiratory: Negative for cough, chest tightness, shortness of breath and wheezing.   Cardiovascular: Negative for chest pain and palpitations.  Gastrointestinal: Negative for abdominal pain.  Musculoskeletal: Negative for back pain.  Skin: Negative for rash.  Neurological: Negative for dizziness, seizures, speech difficulty, weakness, light-headedness and headaches.  Hematological: Negative for adenopathy. Does not bruise/bleed easily.  Psychiatric/Behavioral: Negative for behavioral problems and confusion.    Past Medical History:  Diagnosis Date  . Abnormal renal function 03/01/2019   Formatting of this note is different from the original. eGFR  >=60.0 mL/min/1.41m2 >60.0  31.2Low  CM  >60.0 CM     Last Assessment & Plan:  Formatting of this note might be different from the original. Resolved as of last labs Rechecking labs to ensure stability  . Accelerated hypertension 06/08/2020  . Acute hypoxemic respiratory [place] due [place] 06/08/2020  . Benign essential hypertension 05/29/2016   Last Assessment & Plan:  Formatting of this note is different  from the original. Pertinent Data:   Current medication includes: carvediloL - 3.125 mg losartan-hydroCHLOROthiazide - 100-25 mg .  BP Readings from Last 3 Encounters:  02/29/20 (!) 206/86  08/31/19 (!) 142/78  08/16/19 (!) 160/56   LDL Calculated (mg/dL)  Date Value  08/31/2019 144 (H)  03/01/2019 156 (H)   Creatinine (mg/dL)  Date Val  . CHF (congestive heart failure) ([facility]   . Class 2 severe obesity due to excess calories with serious comorbidity and body mass index (BMI) of 37.0 to 37.9 in adult ([facility] 05/29/2016   Formatting of this note is different from the original. Wt Readings from Last 3 Encounters:  08/31/19 233 lb  08/16/19 239 lb  03/01/19 249 lb    Last Assessment & Plan:  Formatting of this note might be different from the original. BMI Assessment: Current Body mass index is 37.22 kg/m.  Patient BMI currently is above average (>25 kg/m2); BMI follow up plan is completed . Current barriers to heal  . Demand ischemia [place] 06/08/2020  . Drug declined by patient 02/29/2020   Last Assessment & Plan:  Formatting of this note might be different from the original. Patient historically has declined statins and at some point was on losartan/HCTZ and carvedilol, but stopped taking them stating " I dont want to take medications ".  Today I again discussed with patient the risks of uncontrolled HYPERLIPIDEMIA and HYPERTENSION including but not limited to strokes, MIs, renal in  . Encounter for preventative adult health care exam with abnormal findings 06/12/2017  Last Assessment & Plan:  Formatting of this note might be different from the original. Patient for wellness visit.  Overall doing good. No new concerns. Reviewed appropriate age screenings and vaccinations.  Influenza vaccine: Patient Declined Tdap/TD: Patient Declined Zoster vaccine: Patient Declined  Colonoscopy: UTD, due 2022 Mammogram: UTD  Depression screening: negative No falls reported  Dis  . Gynecologic exam normal 08/16/2019   Last  Assessment & Plan:  Formatting of this note might be different from the original. Pap smear with high risk hpv collected for screening Discussed and encouraged healthy lifestyle.  Always use sunscreen when sun exposed.  Encouraged healthy diet with fresh fruits, vegetables, and lean meat.  Encouraged regular exercise for heart health and bone health.  Encouraged continue monthly SBE and yearl  . Heart murmur   . Hypertension   . Mixed hyperlipidemia   . Obese   . Obesity (BMI 30-39.9) 06/08/2020  . Pneumonia due to COVID-19 virus 06/08/2020     Social History   Socioeconomic History  . Marital status: Widowed    Spouse name: Not on file  . Number of children: Not on file  . Years of education: Not on file  . Highest education level: Not on file  Occupational History  . Not on file  Tobacco Use  . Smoking status: Never Smoker  . Smokeless tobacco: Never Used  Substance and Sexual Activity  . Alcohol use: Never  . Drug use: Never  . Sexual activity: Not on file  Other Topics Concern  . Not on file  Social History Narrative  . Not on file   Social Determinants of Health   Financial Resource Strain:   . Difficulty of Paying Living Expenses: Not on file  Food Insecurity:   . Worried About Charity fundraiser in the Last Year: Not on file  . Ran Out of Food in the Last Year: Not on file  Transportation Needs:   . Lack of Transportation (Medical): Not on file  . Lack of Transportation (Non-Medical): Not on file  Physical Activity:   . Days of Exercise per Week: Not on file  . Minutes of Exercise per Session: Not on file  Stress:   . Feeling of Stress : Not on file  Social Connections:   . Frequency of Communication with Friends and Family: Not on file  . Frequency of Social Gatherings with Friends and Family: Not on file  . Attends Religious Services: Not on file  . Active Member of Clubs or Organizations: Not on file  . Attends Archivist Meetings: Not on file  .  Marital Status: Not on file  Intimate Partner Violence:   . Fear of Current or Ex-Partner: Not on file  . Emotionally Abused: Not on file  . Physically Abused: Not on file  . Sexually Abused: Not on file    Past Surgical History:  Procedure Laterality Date  . COLONOSCOPY    . DENTAL SURGERY      Family History  Problem Relation Age of Onset  . Emphysema Mother   . Lung disease Mother   . Brain cancer Father     Allergies  Allergen Reactions  . Clonidine Other (See Comments)    unk  . Nisoldipine Other (See Comments)    unk  . Penicillins Other (See Comments)    unk  . Quinacrine Other (See Comments)    unk  . Quinapril Other (See Comments)    unk    Current Outpatient  Medications on File Prior to Visit  Medication Sig Dispense Refill  . ascorbic acid (VITAMIN C) 500 MG tablet Take 1 tablet (500 mg total) by mouth daily. 30 tablet 0  . aspirin EC 81 MG EC tablet Take 1 tablet (81 mg total) by mouth daily. Swallow whole. 30 tablet 11  . carvedilol (COREG) 3.125 MG tablet Take 1 tablet (3.125 mg total) by mouth 2 (two) times daily with a meal. 120 tablet 0  . losartan (COZAAR) 50 MG tablet 2 tab po q day 60 tablet 0  . zinc sulfate 220 (50 Zn) MG capsule Take 1 capsule (220 mg total) by mouth daily. 30 capsule 0   No current facility-administered medications on file prior to visit.    BP (!) 190/80   Pulse 60   Temp 98.2 F (36.8 C) (Oral)   Resp 18   Ht '5\' 1"'  (1.549 m)   Wt 183 lb 12.8 oz (83.4 kg)   SpO2 99%   BMI 34.73 kg/m       Objective:   Physical Exam General Mental Status- Alert. General Appearance- Not in acute distress.   Skin General: Color- Normal Color. Moisture- Normal Moisture.  Neck Carotid Arteries- Normal color. Moisture- Normal Moisture. No carotid bruits. No JVD.  Chest and Lung Exam Auscultation: Breath Sounds:-Normal.  Cardiovascular Auscultation:Rythm- Regular. Murmurs & Other Heart Sounds:Auscultation of the heart  reveals- No Murmurs.  Abdomen Inspection:-Inspeection Normal. Palpation/Percussion:Note:No mass. Palpation and Percussion of the abdomen reveal- Non Tender, Non Distended + BS, no rebound or guarding.    Neurologic Cranial Nerve exam:- CN III-XII intact(No nystagmus), symmetric smile. Drift Test:- No drift. Romberg Exam:- Negative.  Heal to Toe Gait exam:-Normal. Finger to Nose:- Normal/Intact Strength:- 5/5 equal and symmetric strength both upper and lower extremities.       Assessment & Plan:  Your blood pressures at home are still high despite increasing losartan to 100 mg daily and using Coreg 3.125 mg twice daily.  Her blood pressures were very high today in the office.  Most recent blood pressure reading today at home was 136/64.  Would not expect both machines  to be getting off redaings so will add chlorthalidone 25 mg to your regimen.  Your blood pressure today does not match readings over the last 2 to 3 weeks.  Questioning whether or not you might have whitecoat hypertension making your blood pressure worse today.  Versus your machine giving inaccurate readings.  However you have 2 relatively new BP machines at home so would not expect both to be giving inaccurate readings.  Start chlorthalidone today when you filll the prescription.  Want to schedule nurse blood pressure check tomorrow and bring your blood pressure cuff.  Need to verify accuracy of your machine.  If you have any cardiac or neurologic signs symptoms then recommend ED evaluation.  Mackie Pai, PA-C

## 2020-08-09 ENCOUNTER — Ambulatory Visit (INDEPENDENT_AMBULATORY_CARE_PROVIDER_SITE_OTHER): Payer: BC Managed Care – PPO | Admitting: Medical

## 2020-08-09 ENCOUNTER — Other Ambulatory Visit: Payer: Self-pay | Admitting: *Deleted

## 2020-08-09 ENCOUNTER — Other Ambulatory Visit (INDEPENDENT_AMBULATORY_CARE_PROVIDER_SITE_OTHER): Payer: BC Managed Care – PPO

## 2020-08-09 ENCOUNTER — Telehealth: Payer: Self-pay | Admitting: Medical

## 2020-08-09 ENCOUNTER — Other Ambulatory Visit: Payer: Self-pay

## 2020-08-09 VITALS — BP 178/80 | HR 55

## 2020-08-09 DIAGNOSIS — R748 Abnormal levels of other serum enzymes: Secondary | ICD-10-CM

## 2020-08-09 DIAGNOSIS — I10 Essential (primary) hypertension: Secondary | ICD-10-CM

## 2020-08-09 LAB — COMPREHENSIVE METABOLIC PANEL
ALT: 100 U/L — ABNORMAL HIGH (ref 0–35)
AST: 60 U/L — ABNORMAL HIGH (ref 0–37)
Albumin: 3.8 g/dL (ref 3.5–5.2)
Alkaline Phosphatase: 70 U/L (ref 39–117)
BUN: 21 mg/dL (ref 6–23)
CO2: 31 mEq/L (ref 19–32)
Calcium: 9.3 mg/dL (ref 8.4–10.5)
Chloride: 104 mEq/L (ref 96–112)
Creatinine, Ser: 0.65 mg/dL (ref 0.40–1.20)
GFR: 93.06 mL/min (ref >60.00)
Glucose, Bld: 83 mg/dL (ref 70–99)
Potassium: 4 mEq/L (ref 3.5–5.1)
Sodium: 141 mEq/L (ref 135–145)
Total Bilirubin: 0.7 mg/dL (ref 0.2–1.2)
Total Protein: 6.7 g/dL (ref 6.0–8.3)

## 2020-08-09 MED ORDER — TRIAMTERENE-HCTZ 37.5-25 MG PO TABS: 1.0000 | ORAL_TABLET | Freq: Every day | ORAL | 0 refills | Status: DC

## 2020-08-09 NOTE — Patient Instructions (Signed)
Today you are to discontinue the chlorthalidone and start the triamterene/hctz, which has been sent to your pharmacy.

## 2020-08-09 NOTE — Telephone Encounter (Signed)
Dc chorthalidone. tx maxzide. Wanted to add amlodipine. But in epic unkown reaction other calicium channel blocker. Give [place] update on bp reading in one week.   Yesterday bp at home after diuretric 160 sytolic. So I do think she has some white coat component on top of htn. Her at home machine only slight higher than manual ready today.

## 2020-08-09 NOTE — Progress Notes (Signed)
Pt here for Blood pressure check per Latoya Richters PA-C.  Pt currently takes: carvedilol 3.125mg , chlorthalidone 25mg , losartan 50mg    Patient reports compliance with medication.  Patient started chlorthalidone yesterday around 6pm and has not taken any medication yet today.  Patient brought in her own blood pressure machine today.  Blood pressure today was: 184/66  HR 56  BP today @ = 178/70 HR = 55  Pt advised per blood pressure is high today and we will stop the chlorthalidone and start a new medication called triamterene/hctz (Maxide).

## 2020-08-13 ENCOUNTER — Telehealth: Payer: Self-pay | Admitting: Medical

## 2020-08-13 DIAGNOSIS — R748 Abnormal levels of other serum enzymes: Secondary | ICD-10-CM

## 2020-08-13 NOTE — Telephone Encounter (Signed)
Future liver US placed.

## 2020-08-14 ENCOUNTER — Ambulatory Visit: Payer: BC Managed Care – PPO | Admitting: Cardiology

## 2020-08-14 ENCOUNTER — Other Ambulatory Visit: Payer: Self-pay

## 2020-08-14 ENCOUNTER — Telehealth: Payer: Self-pay

## 2020-08-14 ENCOUNTER — Encounter: Payer: Self-pay | Admitting: Cardiology

## 2020-08-14 VITALS — BP 170/88 | HR 60 | Ht 61.0 in | Wt 178.0 lb

## 2020-08-14 DIAGNOSIS — I10 Essential (primary) hypertension: Secondary | ICD-10-CM | POA: Diagnosis not present

## 2020-08-14 DIAGNOSIS — E669 Obesity, unspecified: Secondary | ICD-10-CM | POA: Diagnosis not present

## 2020-08-14 DIAGNOSIS — E782 Mixed hyperlipidemia: Secondary | ICD-10-CM

## 2020-08-14 MED ORDER — LOSARTAN POTASSIUM 50 MG PO TABS: ORAL_TABLET | ORAL | 1 refills | Status: DC

## 2020-08-14 MED ORDER — TRIAMTERENE-HCTZ 37.5-25 MG PO TABS: 1.0000 | ORAL_TABLET | Freq: Every day | ORAL | 2 refills | Status: DC

## 2020-08-14 MED ORDER — CARVEDILOL 6.25 MG PO TABS
6.2500 mg | ORAL_TABLET | Freq: Two times a day (BID) | ORAL | 2 refills | Status: DC
Start: 2020-08-14 — End: 2020-12-07

## 2020-08-14 NOTE — Progress Notes (Signed)
Cardiology Office Note:    Date:  08/14/2020   ID:  Latoya Rios, DOB 26-Oct-1955, MRN [id number]  PCP:  Mackie Pai, PA-C  Cardiologist:  Berniece Salines, DO  Electrophysiologist:  None   Referring MD: Elise Benne   I am doing fine  History of Present Illness:    Latoya Rios is a 64 y.o. female with a hx of hx of hypertension, here today for follow-up visit. Saw the patient in October 2021 at that time she was hypotensive therefore I decreased her hydralazine and stopped the Aldactone.   Her visit on July 21, 2020 the patient reported that she had been experiencing some dizziness and was concerned about this.  I placed a monitor on the patient and also had blood pressure cuff mailed to the patient and encouraged her to take her blood pressure sitting and standing.  In the interim the patient did see her PCP and her medication was switched over from chlorthalidone to triamterene HCTZ.  She is here today.  I was able to review her blood pressure readings for her and orthostatic hypotension is not playing a role here.  I also spoke with the patient and her daughter about her monitor results.  Past Medical History:  Diagnosis Date  . Abnormal renal function 03/01/2019   Formatting of this note is different from the original. eGFR  >=60.0 mL/min/1.32m2 >60.0  31.2Low  CM  >60.0 CM     Last Assessment & Plan:  Formatting of this note might be different from the original. Resolved as of last labs Rechecking labs to ensure stability  . Accelerated hypertension 06/08/2020  . [place] 06/08[place]  . Benign essential hypertension 05/29/2016   Last Assessment & Plan:  Formatting of this note is different from the original. Pertinent Data:   Current medication includes: carvediloL - 3.125 mg losartan-hydroCHLOROthiazide - 100-25 mg .  BP Readings from Last 3 Encounters:  02/29/20 (!) 206/86  08/31/19 (!) 142/78  08/16/19 (!) 160/56   LDL  Calculated (mg/dL)  Date Value  08/31/2019 144 (H)  03/01/2019 156 (H)   Creatinine (mg/dL)  Date Val  . CHF (congestive heart failure) [place]   . Class 2 severe obesity due to excess calories with serious comorbidity and body mass index (BMI) of 37.0 to 37.9 in adult ([facility] 05/29/2016   Formatting of this note is different from the original. Wt Readings from Last 3 Encounters:  08/31/19 233 lb  08/16/19 239 lb  03/01/19 249 lb    Last Assessment & Plan:  Formatting of this note might be different from the original. BMI Assessment: Current Body mass index is 37.22 kg/m.  Patient BMI currently is above average (>25 kg/m2); BMI follow up plan is completed . Current barriers to heal  . Demand ischemia [place] 06/08/[place]  . Drug declined by patient 02/29/2020   Last Assessment & Plan:  Formatting of this note might be different from the original. Patient historically has declined statins and at some point was on losartan/HCTZ and carvedilol, but stopped taking them stating " I dont want to take medications ".  Today I again discussed with patient the risks of uncontrolled HYPERLIPIDEMIA and HYPERTENSION including but not limited to strokes, MIs, renal in  . Encounter for preventative adult health care exam with abnormal findings 06/12/2017   Last Assessment & Plan:  Formatting of this note might be different from the original. Patient for wellness visit.  Overall doing good. No  new concerns. Reviewed appropriate age screenings and vaccinations.  Influenza vaccine: Patient Declined Tdap/TD: Patient Declined Zoster vaccine: Patient Declined  Colonoscopy: UTD, due 2022 Mammogram: UTD  Depression screening: negative No falls reported  Dis  . Gynecologic exam normal 08/16/2019   Last Assessment & Plan:  Formatting of this note might be different from the original. Pap smear with high risk hpv collected for screening Discussed and encouraged healthy lifestyle.  Always use sunscreen when sun exposed.  Encouraged healthy  diet with fresh fruits, vegetables, and lean meat.  Encouraged regular exercise for heart health and bone health.  Encouraged continue monthly SBE and yearl  . Heart murmur   . Hypertension   . Mixed hyperlipidemia   . Obese   . Obesity (BMI 30-39.9) 06/08/2020  . Pneumonia due to COVID-19 virus 06/08/2020    Past Surgical History:  Procedure Laterality Date  . COLONOSCOPY    . DENTAL SURGERY      Current Medications: Current Meds  Medication Sig  . aspirin EC 81 MG EC tablet Take 1 tablet (81 mg total) by mouth daily. Swallow whole.  . carvedilol (COREG) 6.25 MG tablet Take 1 tablet (6.25 mg total) by mouth 2 (two) times daily with a meal.  . losartan (COZAAR) 50 MG tablet 2 tab po q day  . triamterene-hydrochlorothiazide (MAXZIDE-25) 37.5-25 MG tablet Take 1 tablet by mouth daily. Dc chlorthalidone  . [DISCONTINUED] carvedilol (COREG) 3.125 MG tablet Take 1 tablet (3.125 mg total) by mouth 2 (two) times daily with a meal.  . [DISCONTINUED] losartan (COZAAR) 50 MG tablet 2 tab po q day  . [DISCONTINUED] triamterene-hydrochlorothiazide (MAXZIDE-25) 37.5-25 MG tablet Take 1 tablet by mouth daily. Dc chlorthalidone     Allergies:   Clonidine, Nisoldipine, Penicillins, Quinacrine, and Quinapril   Social History   Socioeconomic History  . Marital status: Widowed    Spouse name: Not on file  . Number of children: Not on file  . Years of education: Not on file  . Highest education level: Not on file  Occupational History  . Not on file  Tobacco Use  . Smoking status: Never Smoker  . Smokeless tobacco: Never Used  Substance and Sexual Activity  . Alcohol use: Never  . Drug use: Never  . Sexual activity: Not on file  Other Topics Concern  . Not on file  Social History Narrative  . Not on file   Social Determinants of Health   Financial Resource Strain:   . Difficulty of Paying Living Expenses: Not on file  Food Insecurity:   . Worried About Charity fundraiser in the  Last Year: Not on file  . Ran Out of Food in the Last Year: Not on file  Transportation Needs:   . Lack of Transportation (Medical): Not on file  . Lack of Transportation (Non-Medical): Not on file  Physical Activity:   . Days of Exercise per Week: Not on file  . Minutes of Exercise per Session: Not on file  Stress:   . Feeling of Stress : Not on file  Social Connections:   . Frequency of Communication with Friends and Family: Not on file  . Frequency of Social Gatherings with Friends and Family: Not on file  . Attends Religious Services: Not on file  . Active Member of Clubs or Organizations: Not on file  . Attends Archivist Meetings: Not on file  . Marital Status: Not on file     Family History: The patient's family  history includes Brain cancer in her father; Emphysema in her mother; Lung disease in her mother.  ROS:   Review of Systems  Constitution: Negative for decreased appetite, fever and weight gain.  HENT: Negative for congestion, ear discharge, hoarse voice and sore throat.   Eyes: Negative for discharge, redness, vision loss in right eye and visual halos.  Cardiovascular: Negative for chest pain, dyspnea on exertion, leg swelling, orthopnea and palpitations.  Respiratory: Negative for cough, hemoptysis, shortness of breath and snoring.   Endocrine: Negative for heat intolerance and polyphagia.  Hematologic/Lymphatic: Negative for bleeding problem. Does not bruise/bleed easily.  Skin: Negative for flushing, nail changes, rash and suspicious lesions.  Musculoskeletal: Negative for arthritis, joint pain, muscle cramps, myalgias, neck pain and stiffness.  Gastrointestinal: Negative for abdominal pain, bowel incontinence, diarrhea and excessive appetite.  Genitourinary: Negative for decreased libido, genital sores and incomplete emptying.  Neurological: Negative for brief paralysis, focal weakness, headaches and loss of balance.  Psychiatric/Behavioral: Negative  for altered mental status, depression and suicidal ideas.  Allergic/Immunologic: Negative for HIV exposure and persistent infections.    EKGs/Labs/Other Studies Reviewed:    The following studies were reviewed today:   EKG: None today  Zio monitor:  The patient wore the monitor for 6 days 20 hours starting July 21, 2020. Indication: Dizziness and giddiness The minimum heart rate was 43 bpm, maximum heart rate was 154 bpm, and average heart rate was 58 bpm. Predominant underlying rhythm was Sinus Rhythm.  24 Supraventricular Tachycardia runs occurred, the run with the fastest interval lasting 5 beats with a max rate of 154 bpm, the longest lasting 7 beats with an avg rate of 102 bpm.  Premature atrial complexes were occasional (2.2%, 12429). Premature Ventricular complexes were occasional (1.3%, 7174).  No pauses, No AV block and no atrial fibrillation present.  4 patient triggered events are associated with premature ventricular complex and premature atrial complex.  3 diary events associated with premature atrial complex.  Conclusion: This study is remarkable for the following:                             1.  Paroxysmal supraventricular tachycardia which is likely atrial tachycardia with variable block.                             2.  Occasional premature atrial complexes.                             3.  Occasional premature ventricular complex.    TTE IMPRESSIONS  1. Left ventricular ejection fraction, by estimation, is 60 to 65%. The left ventricle has normal function. The left ventricle has no regional wall motion abnormalities. Left ventricular diastolic parameters are  consistent with Grade I diastolic dysfunction (impaired relaxation).  2. Right ventricular systolic function is normal. The right ventricular size is normal. Tricuspid regurgitation signal is inadequate for assessing PA pressure.  3. The mitral valve is grossly normal. Mild mitral valve regurgitation.  No evidence of mitral stenosis.  4. The aortic valve is grossly normal. Aortic valve regurgitation is not  visualized. No aortic stenosis is present.  5. The inferior vena cava is normal in size with <50% respiratory  variability, suggesting right atrial pressure of 8 mmHg.   FINDINGS  Left Ventricle: Left ventricular ejection fraction, by estimation, is 60  to 65%. The left ventricle has normal function. The left ventricle has no  regional wall motion abnormalities. The left ventricular internal cavity  size was normal in size. There is  no left ventricular hypertrophy. Left ventricular diastolic parameters  are consistent with Grade I diastolic dysfunction (impaired relaxation).   Right Ventricle: The right ventricular size is normal. No increase in  right ventricular wall thickness. Right ventricular systolic function is  normal. Tricuspid regurgitation signal is inadequate for assessing PA  pressure.   Left Atrium: Left atrial size was normal in size.   Right Atrium: Right atrial size was normal in size.   Pericardium: Trivial pericardial effusion is present. Presence of  pericardial fat pad.   Mitral Valve: The mitral valve is grossly normal. Mild mitral valve  regurgitation. No evidence of mitral valve stenosis.   Tricuspid Valve: The tricuspid valve is grossly normal. Tricuspid valve  regurgitation is trivial. No evidence of tricuspid stenosis.   Aortic Valve: The aortic valve is grossly normal. Aortic valve  regurgitation is not visualized. No aortic stenosis is present.   Pulmonic Valve: The pulmonic valve was grossly normal. Pulmonic valve  regurgitation is not visualized. No evidence of pulmonic stenosis.   Aorta: The aortic root is normal in size and structure.   Venous: The inferior vena cava is normal in size with less than 50%  respiratory variability, suggesting right atrial pressure of 8 mmHg.   IAS/Shunts: The atrial septum is grossly normal.      Recent Labs: 06/08/2020: B Natriuretic Peptide 297.3 06/25/2020: Hemoglobin 13.6; Platelets 326 07/25/2020: Magnesium 1.6; Pro B Natriuretic peptide (BNP) 93.0 08/09/2020: ALT 100; BUN 21; Creatinine, Ser 0.65; Potassium 4.0; Sodium 141  Recent Lipid Panel    Component Value Date/Time   TRIG 84 06/08/2020 0225    Physical Exam:    VS:  BP (!) 170/88   Pulse 60   Ht '5\' 1"'  (1.549 m)   Wt 178 lb (80.7 kg)   SpO2 99%   BMI 33.63 kg/m     Wt Readings from Last 3 Encounters:  08/14/20 178 lb (80.7 kg)  08/08/20 183 lb 12.8 oz (83.4 kg)  07/25/20 182 lb 6.4 oz (82.7 kg)     GEN: Well nourished, well developed in no acute distress HEENT: Normal NECK: No JVD; No carotid bruits LYMPHATICS: No lymphadenopathy CARDIAC: S1S2 noted,RRR, no murmurs, rubs, gallops RESPIRATORY:  Clear to auscultation without rales, wheezing or rhonchi  ABDOMEN: Soft, non-tender, non-distended, +bowel sounds, no guarding. EXTREMITIES: No edema, No cyanosis, no clubbing MUSCULOSKELETAL:  No deformity  SKIN: Warm and dry NEUROLOGIC:  Alert and oriented x 3, non-focal PSYCHIATRIC:  Normal affect, good insight  ASSESSMENT:    1. Benign essential hypertension   2. Mixed hyperlipidemia   3. Obesity (BMI 30-39.9)    PLAN:     Her blood pressure was manually taken by me today in the office right arm 170/88, left arm 166/92 mmHg.  Increase her carvedilol to 6.25 mg twice a day.  In addition we discussed her monitor results which show occasional PACs and occasional PVCs with paroxysmal atrial tachycardia.  I was able to answer her questions in the office today.  Giving her history of hypotension as long as we can keep the patient blood pressure under control ideally 130/80 mmHg but will be okay with 140/80 as long as there is no significant dizziness.  And also especially in this patient who had significant hypotension due to polypharmacy.  We also discussed  exercise.  Her blood pressure is improving and of  asked the patient to take her blood pressure daily and she is encouraged to gently exercise for at least 30 minutes a day.  We talked about exercise safety and also making sure went to avoid exercises based on clinical symptoms.  The patient is in agreement with the above plan. The patient left the office in stable condition.  The patient will follow up in 1 month.  Okay for virtual visit as patient does have means of getting her vitals.   Medication Adjustments/Labs and Tests Ordered: Current medicines are reviewed at length with the patient today.  Concerns regarding medicines are outlined above.  No orders of the defined types were placed in this encounter.  Meds ordered this encounter  Medications  . carvedilol (COREG) 6.25 MG tablet    Sig: Take 1 tablet (6.25 mg total) by mouth 2 (two) times daily with a meal.    Dispense:  120 tablet    Refill:  2  . losartan (COZAAR) 50 MG tablet    Sig: 2 tab po q day    Dispense:  120 tablet    Refill:  1    Looks like yesterday may have sent in just sig 1 tab daily.  Marland Kitchen triamterene-hydrochlorothiazide (MAXZIDE-25) 37.5-25 MG tablet    Sig: Take 1 tablet by mouth daily. Dc chlorthalidone    Dispense:  90 tablet    Refill:  2    Patient Instructions  Medication Instructions:  Your physician has recommended you make the following change in your medication:   INCREASE: Carvedilol to 6.25 mg twice daily   *If you need a refill on your cardiac medications before your next appointment, please call your pharmacy*   Lab Work: none If you have labs (blood work) drawn today and your tests are completely normal, you will receive your results only by: Marland Kitchen MyChart Message (if you have MyChart) OR . A paper copy in the mail If you have any lab test that is abnormal or we need to change your treatment, we will call you to review the results.   Testing/Procedures: none   Follow-Up: At [facility], you and your health needs are our priority.   As part of our continuing mission to provide you with exceptional heart care, we have created designated Provider Care Teams.  These Care Teams include your primary Cardiologist (physician) and Advanced Practice Providers (APPs -  Physician Assistants and Nurse Practitioners) who all work together to provide you with the care you need, when you need it.  We recommend signing up for the patient portal called "MyChart".  Sign up information is provided on this After Visit Summary.  MyChart is used to connect with patients for Virtual Visits (Telemedicine).  Patients are able to view lab/test results, encounter notes, upcoming appointments, etc.  Non-urgent messages can be sent to your provider as well.   To learn more about what you can do with MyChart, go to [email or web address].    Your next appointment:   1 month(s)  The format for your next appointment:   Virtual Visit   Provider:   Berniece Salines, DO   Other Instructions      Adopting a Healthy Lifestyle.  Know what a healthy weight is for you (roughly BMI <25) and aim to maintain this   Aim for 7+ servings of fruits and vegetables daily   65-80+ fluid ounces of water or unsweet tea for healthy kidneys   Limit  to max 1 drink of alcohol per day; avoid smoking/tobacco   Limit animal fats in diet for cholesterol and heart health - choose grass fed whenever available   Avoid highly processed foods, and foods high in saturated/trans fats   Aim for low stress - take time to unwind and care for your mental health   Aim for 150 min of moderate intensity exercise weekly for heart health, and weights twice weekly for bone health   Aim for 7-9 hours of sleep daily   When it comes to diets, agreement about the perfect plan isnt easy to find, even among the experts. Experts at the [employer] developed an idea known as the Healthy Eating Plate. Just imagine a plate divided into logical, healthy portions.   The  emphasis is on diet quality:   Load up on vegetables and fruits - one-half of your plate: Aim for color and variety, and remember that potatoes dont count.   Go for whole grains - one-quarter of your plate: Whole wheat, barley, wheat berries, quinoa, oats, brown rice, and foods made with them. If you want pasta, go with whole wheat pasta.   Protein power - one-quarter of your plate: Fish, chicken, beans, and nuts are all healthy, versatile protein sources. Limit red meat.   The diet, however, does go beyond the plate, offering a few other suggestions.   Use healthy plant oils, such as olive, canola, soy, corn, sunflower and peanut. Check the labels, and avoid partially hydrogenated oil, which have unhealthy trans fats.   If youre thirsty, drink water. Coffee and tea are good in moderation, but skip sugary drinks and limit milk and dairy products to one or two daily servings.   The type of carbohydrate in the diet is more important than the amount. Some sources of carbohydrates, such as vegetables, fruits, whole grains, and beans-are healthier than others.   Finally, stay active  Signed, Berniece Salines, DO  08/14/2020 1:51 PM    [facility]

## 2020-08-14 NOTE — Patient Instructions (Signed)
Medication Instructions:  Your physician has recommended you make the following change in your medication:   INCREASE: Carvedilol to 6.25 mg twice daily   *If you need a refill on your cardiac medications before your next appointment, please call your pharmacy*   Lab Work: none If you have labs (blood work) drawn today and your tests are completely normal, you will receive your results only by: Marland Kitchen MyChart Message (if you have MyChart) OR . A paper copy in the mail If you have any lab test that is abnormal or we need to change your treatment, we will call you to review the results.   Testing/Procedures: none   Follow-Up: At [facility], you and your health needs are our priority.  As part of our continuing mission to provide you with exceptional heart care, we have created designated Provider Care Teams.  These Care Teams include your primary Cardiologist (physician) and Advanced Practice Providers (APPs -  Physician Assistants and Nurse Practitioners) who all work together to provide you with the care you need, when you need it.  We recommend signing up for the patient portal called "MyChart".  Sign up information is provided on this After Visit Summary.  MyChart is used to connect with patients for Virtual Visits (Telemedicine).  Patients are able to view lab/test results, encounter notes, upcoming appointments, etc.  Non-urgent messages can be sent to your provider as well.   To learn more about what you can do with MyChart, go to [email or web address].    Your next appointment:   1 month(s)  The format for your next appointment:   Virtual Visit   Provider:   Thomasene Ripple, DO   Other Instructions

## 2020-08-14 NOTE — Telephone Encounter (Signed)
Spoke with patient regarding results and recommendation.  Patient verbalizes understanding and is agreeable to plan of care. Advised patient to call back with any issues or concerns.  

## 2020-08-14 NOTE — Telephone Encounter (Signed)
-----   Message from Thomasene Ripple, DO sent at 08/09/2020  9:42 PM EST ----- Have the patient come next week when I am in [facility] to discuss her monitor results.

## 2020-08-16 ENCOUNTER — Encounter: Payer: Self-pay | Admitting: Medical

## 2020-08-24 ENCOUNTER — Ambulatory Visit (INDEPENDENT_AMBULATORY_CARE_PROVIDER_SITE_OTHER): Payer: BC Managed Care – PPO | Admitting: Family Medicine

## 2020-08-24 ENCOUNTER — Ambulatory Visit (HOSPITAL_BASED_OUTPATIENT_CLINIC_OR_DEPARTMENT_OTHER)
Admission: RE | Admit: 2020-08-24 | Discharge: 2020-08-24 | Disposition: A | Discharge status: Home / Self Care | Payer: BC Managed Care – PPO | Source: Ambulatory Visit | Attending: Medical | Admitting: Medical

## 2020-08-24 ENCOUNTER — Other Ambulatory Visit (HOSPITAL_COMMUNITY)
Admission: RE | Admit: 2020-08-24 | Discharge: 2020-08-24 | Disposition: A | Discharge status: Home / Self Care | Payer: BC Managed Care – PPO | Source: Ambulatory Visit | Attending: Family Medicine | Admitting: Family Medicine

## 2020-08-24 ENCOUNTER — Encounter: Payer: Self-pay | Admitting: Family Medicine

## 2020-08-24 ENCOUNTER — Other Ambulatory Visit: Payer: Self-pay

## 2020-08-24 VITALS — BP 171/58 | HR 56 | Ht 61.0 in | Wt 179.0 lb

## 2020-08-24 DIAGNOSIS — R748 Abnormal levels of other serum enzymes: Secondary | ICD-10-CM

## 2020-08-24 DIAGNOSIS — Z01419 Encounter for gynecological examination (general) (routine) without abnormal findings: Secondary | ICD-10-CM | POA: Insufficient documentation

## 2020-08-24 NOTE — Progress Notes (Signed)
GYNECOLOGY ANNUAL PREVENTATIVE CARE ENCOUNTER NOTE  Subjective:   Latoya Rios is a 64 y.o. G61P0101 female here for a routine annual gynecologic exam.  Current complaints: none.   Denies abnormal vaginal bleeding, discharge, pelvic pain, problems with intercourse or other gynecologic concerns.    Gynecologic History No LMP recorded. Patient is postmenopausal. Patient is not sexually active  Contraception: post menopausal status Last Pap: 1-2 years ago. Results were: normal Last mammogram: last year. Results were: normal  Obstetric History OB History  Gravida Para Term Preterm AB Living  '1 1   1   1  ' SAB IAB Ectopic Multiple Live Births          1    # Outcome Date GA Lbr Len/2nd Weight Sex Delivery Anes PTL Lv  1 Preterm 1985 [redacted]w[redacted]d  F Vag-Spont EPI N LIV    Past Medical History:  Diagnosis Date  . Abnormal renal function 03/01/2019   Formatting of this note is different from the original. eGFR  >=60.0 mL/min/1.779m >60.0  31.2Low  CM  >60.0 CM     Last Assessment & Plan:  Formatting of this note might be different from the original. Resolved as of last labs Rechecking labs to ensure stability  . Accelerated hypertension 06/08/2020  . Acute hypoxemic respiratory failure due to COVID-19 (HCLew[facility]9/23/2021  . Benign essential hypertension 05/29/2016   Last Assessment & Plan:  Formatting of this note is different from the original. Pertinent Data:   Current medication includes: carvediloL - 3.125 mg losartan-hydroCHLOROthiazide - 100-25 mg .  BP Readings from Last 3 Encounters:  02/29/20 (!) 206/86  08/31/19 (!) 142/78  08/16/19 (!) 160/56   LDL Calculated (mg/dL)  Date Value  08/31/2019 144 (H)  03/01/2019 156 (H)   Creatinine (mg/dL)  Date Val  . CHF (congestive heart failure) (H[facility]ill[place]  . Class 2 severe obesity due to excess calories with serious comorbidity and body mass index (BMI) of 37.0 to 37.9 in adult ([facility]9/13/2017   Formatting of this note is different from the original. Wt  Readings from Last 3 Encounters:  08/31/19 233 lb  08/16/19 239 lb  03/01/19 249 lb    Last Assessment & Plan:  Formatting of this note might be different from the original. BMI Assessment: Current Body mass index is 37.22 kg/m.  Patient BMI currently is above average (>25 kg/m2); BMI follow up plan is completed . Current barriers to heal  . Demand ischemia (HC[facility]9/23/2021  . Drug declined by patient 02/29/2020   Last Assessment & Plan:  Formatting of this note might be different from the original. Patient historically has declined statins and at some point was on losartan/HCTZ and carvedilol, but stopped taking them stating " I dont want to take medications ".  Today I again discussed with patient the risks of uncontrolled HYPERLIPIDEMIA and HYPERTENSION including but not limited to strokes, MIs, renal in  . Encounter for preventative adult health care exam with abnormal findings 06/12/2017   Last Assessment & Plan:  Formatting of this note might be different from the original. Patient for wellness visit.  Overall doing good. No new concerns. Reviewed appropriate age screenings and vaccinations.  Influenza vaccine: Patient Declined Tdap/TD: Patient Declined Zoster vaccine: Patient Declined  Colonoscopy: UTD, due 2022 Mammogram: UTD  Depression screening: negative No falls reported  Dis  . Gynecologic exam normal 08/16/2019   Last Assessment & Plan:  Formatting of this note might be different from the original.  Pap smear with high risk hpv collected for screening Discussed and encouraged healthy lifestyle.  Always use sunscreen when sun exposed.  Encouraged healthy diet with fresh fruits, vegetables, and lean meat.  Encouraged regular exercise for heart health and bone health.  Encouraged continue monthly SBE and yearl  . Heart murmur   . Hypertension   . Mixed hyperlipidemia   . Obese   . Obesity (BMI 30-39.9) 06/08/2020  . Pneumonia due to COVID-19 virus 06/08/2020    Past Surgical History:   Procedure Laterality Date  . COLONOSCOPY    . DENTAL SURGERY      Current Outpatient Medications on File Prior to Visit  Medication Sig Dispense Refill  . aspirin EC 81 MG EC tablet Take 1 tablet (81 mg total) by mouth daily. Swallow whole. 30 tablet 11  . carvedilol (COREG) 6.25 MG tablet Take 1 tablet (6.25 mg total) by mouth 2 (two) times daily with a meal. 120 tablet 2  . Cholecalciferol (VITAMIN D3) 10 MCG (400 UNIT) CAPS Take by mouth.    . losartan (COZAAR) 50 MG tablet 2 tab po q day 120 tablet 1  . triamterene-hydrochlorothiazide (MAXZIDE-25) 37.5-25 MG tablet Take 1 tablet by mouth daily. Dc chlorthalidone 90 tablet 2  . ascorbic acid (VITAMIN C) 500 MG tablet Take 1 tablet (500 mg total) by mouth daily. (Patient not taking: Reported on 08/14/2020) 30 tablet 0  . zinc sulfate 220 (50 Zn) MG capsule Take 1 capsule (220 mg total) by mouth daily. (Patient not taking: Reported on 08/14/2020) 30 capsule 0   No current facility-administered medications on file prior to visit.    Allergies  Allergen Reactions  . Clonidine Other (See Comments)    unk  . Nisoldipine Other (See Comments)    unk  . Penicillins Other (See Comments)    welps  . Quinacrine Other (See Comments)    unk  . Quinapril Other (See Comments)    unk    Social History   Socioeconomic History  . Marital status: Widowed    Spouse name: Not on file  . Number of children: Not on file  . Years of education: Not on file  . Highest education level: Not on file  Occupational History  . Not on file  Tobacco Use  . Smoking status: Never Smoker  . Smokeless tobacco: Never Used  Substance and Sexual Activity  . Alcohol use: Never  . Drug use: Never  . Sexual activity: Not Currently  Other Topics Concern  . Not on file  Social History Narrative  . Not on file   Social Determinants of Health   Financial Resource Strain: Not on file  Food Insecurity: Not on file  Transportation Needs: Not on file   Physical Activity: Not on file  Stress: Not on file  Social Connections: Not on file  Intimate Partner Violence: Not on file    Family History  Problem Relation Age of Onset  . Emphysema Mother   . Lung disease Mother   . Brain cancer Father     The following portions of the patient's history were reviewed and updated as appropriate: allergies, current medications, past family history, past medical history, past social history, past surgical history and problem list.  Review of Systems Pertinent items are noted in HPI.   Objective:  BP (!) 171/58   Pulse (!) 56   Ht '5\' 1"'  (1.549 m)   Wt 179 lb (81.2 kg)   BMI 33.82 kg/m  Wt  Readings from Last 3 Encounters:  08/24/20 179 lb (81.2 kg)  08/14/20 178 lb (80.7 kg)  08/08/20 183 lb 12.8 oz (83.4 kg)     Chaperone present during exam  CONSTITUTIONAL: Well-developed, well-nourished female in no acute distress.  HENT:  Normocephalic, atraumatic, External right and left ear normal. Oropharynx is clear and moist EYES: Conjunctivae and EOM are normal. Pupils are equal, round, and reactive to light. No scleral icterus.  NECK: Normal range of motion, supple, no masses.  Normal thyroid.   CARDIOVASCULAR: Normal heart rate noted, regular rhythm RESPIRATORY: Clear to auscultation bilaterally. Effort and breath sounds normal, no problems with respiration noted. BREASTS: Symmetric in size. No masses, skin changes, nipple drainage, or lymphadenopathy. ABDOMEN: Soft, normal bowel sounds, no distention noted.  No tenderness, rebound or guarding.  PELVIC: Normal appearing external genitalia; atrophic appearing vaginal mucosa and cervix.  No abnormal discharge noted.  Normal uterine size, no other palpable masses, no uterine or adnexal tenderness. MUSCULOSKELETAL: Normal range of motion. No tenderness.  No cyanosis, clubbing, or edema.  2+ distal pulses. SKIN: Skin is warm and dry. No rash noted. Not diaphoretic. No erythema. No  pallor. NEUROLOGIC: Alert and oriented to person, place, and time. Normal reflexes, muscle tone coordination. No cranial nerve deficit noted. PSYCHIATRIC: Normal mood and affect. Normal behavior. Normal judgment and thought content.  Assessment:  Annual gynecologic examination with pap smear   Plan:  1. Well Woman Exam Will follow up results of pap smear and manage accordingly. Mammogram scheduled - Cytology - PAP( [facility]) - MM DIGITAL SCREENING BILATERAL; Future   Routine preventative health maintenance measures emphasized. Please refer to After Visit Summary for other counseling recommendations.    Loma [place], [place] for [employer]

## 2020-08-25 LAB — CYTOLOGY - PAP
Adequacy: ABSENT
Comment: NEGATIVE
Diagnosis: NEGATIVE
High risk HPV: NEGATIVE

## 2020-09-04 ENCOUNTER — Other Ambulatory Visit: Payer: Self-pay

## 2020-09-04 ENCOUNTER — Ambulatory Visit: Payer: BC Managed Care – PPO | Admitting: Cardiology

## 2020-09-06 ENCOUNTER — Telehealth (INDEPENDENT_AMBULATORY_CARE_PROVIDER_SITE_OTHER): Payer: BC Managed Care – PPO | Admitting: Cardiology

## 2020-09-06 ENCOUNTER — Encounter: Payer: Self-pay | Admitting: Cardiology

## 2020-09-06 VITALS — BP 125/60 | HR 53 | Ht 61.0 in | Wt 179.2 lb

## 2020-09-06 DIAGNOSIS — E669 Obesity, unspecified: Secondary | ICD-10-CM | POA: Diagnosis not present

## 2020-09-06 DIAGNOSIS — E782 Mixed hyperlipidemia: Secondary | ICD-10-CM | POA: Diagnosis not present

## 2020-09-06 DIAGNOSIS — I10 Essential (primary) hypertension: Secondary | ICD-10-CM | POA: Diagnosis not present

## 2020-09-06 NOTE — Patient Instructions (Signed)
Medication Instructions:  No medication changes. *If you need a refill on your cardiac medications before your next appointment, please call your pharmacy*   Lab Work: None ordered If you have labs (blood work) drawn today and your tests are completely normal, you will receive your results only by: Marland Kitchen MyChart Message (if you have MyChart) OR . A paper copy in the mail If you have any lab test that is abnormal or we need to change your treatment, we will call you to review the results.   Testing/Procedures: None ordered   Follow-Up: At [facility], you and your health needs are our priority.  As part of our continuing mission to provide you with exceptional heart care, we have created designated Provider Care Teams.  These Care Teams include your primary Cardiologist (physician) and Advanced Practice Providers (APPs -  Physician Assistants and Nurse Practitioners) who all work together to provide you with the care you need, when you need it.  We recommend signing up for the patient portal called "MyChart".  Sign up information is provided on this After Visit Summary.  MyChart is used to connect with patients for Virtual Visits (Telemedicine).  Patients are able to view lab/test results, encounter notes, upcoming appointments, etc.  Non-urgent messages can be sent to your provider as well.   To learn more about what you can do with MyChart, go to [email or web address].    Your next appointment:   3 month(s)  The format for your next appointment:   Virtual Visit   Provider:   Thomasene Ripple, DO   Other Instructions [place] and Happy New Year!

## 2020-09-06 NOTE — Progress Notes (Signed)
Telehealth visit  Date:  09/06/2020   ID:  Latoya Rios, DOB [birth year], MRN [id number]  The patient was at home. I am in the office.  PCP:  Mackie Pai, PA-C  Cardiologist:  Berniece Salines, DO  Electrophysiologist:  None   Evaluation Performed:   Chief Complaint: I am doing well  History of Present Illness:    Latoya Rios is a 64 y.o. female with hypertension here today for follow-up visit.  I saw the patient in October 2021 at that time she was hypotensive therefore I decreased her hydralazine and stopped the Aldactone.   Her visit on July 21, 2020 the patient reported that she had been experiencing some dizziness and was concerned about this.  I placed a monitor on the patient and also had blood pressure cuff mailed to the patient and encouraged her to take her blood pressure sitting and standing.  I last saw the patient on August 14, 2020 at that time her blood pressure was elevated I increased her carvedilol to 6.25 mg twice a day.  We talked about her monitor results which show occasional PACs, PVCs as well as paroxysmal atrial tachycardia.  She also had questions about exercise as and we discussed this.  The patient does not have symptoms concerning for COVID-19 infection.   Past Medical History:  Diagnosis Date  . Abnormal renal function 03/01/2019   Formatting of this note is different from the original. eGFR  >=60.0 mL/min/1.38m2 >60.0  31.2Low  CM  >60.0 CM     Last Assessment & Plan:  Formatting of this note might be different from the original. Resolved as of last labs Rechecking labs to ensure stability  . Accelerated hypertension 06/08/2020  . Acute cystitis without hematuria 06/24/2020  . Acute hypoxemic respiratory failure due to COVID-19 (HB[place] 06/08/2020  . Acute kidney injury ([facility] [place] 06/23/2020  . AKI (acute kidney injury) ([facility] [place] 06/24/2020  . ARF (acute renal failure) ([facility] 06/24/2020  . Benign essential hypertension 05/29/2016   Last Assessment & Plan:   Formatting of this note is different from the original. Pertinent Data:   Current medication includes: carvediloL - 3.125 mg losartan-hydroCHLOROthiazide - 100-25 mg .  BP Readings from Last 3 Encounters:  02/29/20 (!) 206/86  08/31/19 (!) 142/78  08/16/19 (!) 160/56   LDL Calculated (mg/dL)  Date Value  08/31/2019 144 (H)  03/01/2019 156 (H)   Creatinine (mg/dL)  Date Val  . CHF (congestive heart failure) ([facility] [place]   . Class 2 severe obesity due to excess calories with serious comorbidity and body mass index (BMI) of 37.0 to 37.9 in adult ([facility] 05/29/2016   Formatting of this note is different from the original. Wt Readings from Last 3 Encounters:  08/31/19 233 lb  08/16/19 239 lb  03/01/19 249 lb    Last Assessment & Plan:  Formatting of this note might be different from the original. BMI Assessment: Current Body mass index is 37.22 kg/m.  Patient BMI currently is above average (>25 kg/m2); BMI follow up plan is completed . Current barriers to heal  . Demand ischemia (H[place] 06/08/2020  . Drug declined by patient 02/29/2020   Last Assessment & Plan:  Formatting of this note might be different from the original. Patient historically has declined statins and at some point was on losartan/HCTZ and carvedilol, but stopped taking them stating " I dont want to take medications ".  Today I again discussed with patient the risks of uncontrolled HYPERLIPIDEMIA and HYPERTENSION including but not limited to  strokes, MIs, renal in  . Encounter for preventative adult health care exam with abnormal findings 06/12/2017   Last Assessment & Plan:  Formatting of this note might be different from the original. Patient for wellness visit.  Overall doing good. No new concerns. Reviewed appropriate age screenings and vaccinations.  Influenza vaccine: Patient Declined Tdap/TD: Patient Declined Zoster vaccine: Patient Declined  Colonoscopy: UTD, due 2022 Mammogram: UTD  Depression screening: negative No falls reported  Dis  . Gynecologic  exam normal 08/16/2019   Last Assessment & Plan:  Formatting of this note might be different from the original. Pap smear with high risk hpv collected for screening Discussed and encouraged healthy lifestyle.  Always use sunscreen when sun exposed.  Encouraged healthy diet with fresh fruits, vegetables, and lean meat.  Encouraged regular exercise for heart health and bone health.  Encouraged continue monthly SBE and yearl  . Heart murmur   . Hypertension   . Hypotension due to drugs 06/22/2020  . Mixed hyperlipidemia   . Obese   . Obesity (BMI 30-39.9) 06/08/2020  . Pneumonia due to COVID-19 virus 06/08/2020   Past Surgical History:  Procedure Laterality Date  . COLONOSCOPY    . DENTAL SURGERY       Current Meds  Medication Sig  . aspirin EC 81 MG EC tablet Take 1 tablet (81 mg total) by mouth daily. Swallow whole.  . carvedilol (COREG) 6.25 MG tablet Take 1 tablet (6.25 mg total) by mouth 2 (two) times daily with a meal.  . Cholecalciferol (VITAMIN D3) 10 MCG (400 UNIT) CAPS Take by mouth.  . losartan (COZAAR) 50 MG tablet 2 tab po q day  . triamterene-hydrochlorothiazide (MAXZIDE-25) 37.5-25 MG tablet Take 1 tablet by mouth daily. Dc chlorthalidone     Allergies:   Clonidine, Nisoldipine, Penicillins, Quinacrine, and Quinapril   Social History   Tobacco Use  . Smoking status: Never Smoker  . Smokeless tobacco: Never Used  Substance Use Topics  . Alcohol use: Never  . Drug use: Never     Family Hx: The patient's family history includes Brain cancer in her father; Emphysema in her mother; Lung disease in her mother.  ROS:   Constitution: Negative for decreased appetite, fever and weight gain.  HENT: Negative for congestion, ear discharge, hoarse voice and sore throat.   Eyes: Negative for discharge, redness, vision loss in right eye and visual halos.  Cardiovascular: Negative for chest pain, dyspnea on exertion, leg swelling, orthopnea and palpitations.  Respiratory:  Negative for cough, hemoptysis, shortness of breath and snoring.   Endocrine: Negative for heat intolerance and polyphagia.  Hematologic/Lymphatic: Negative for bleeding problem. Does not bruise/bleed easily.  Skin: Negative for flushing, nail changes, rash and suspicious lesions.  Musculoskeletal: Negative for arthritis, joint pain, muscle cramps, myalgias, neck pain and stiffness.  Gastrointestinal: Negative for abdominal pain, bowel incontinence, diarrhea and excessive appetite.  Genitourinary: Negative for decreased libido, genital sores and incomplete emptying.  Neurological: Negative for brief paralysis, focal weakness, headaches and loss of balance.  Psychiatric/Behavioral: Negative for altered mental status, depression and suicidal ideas.  Allergic/Immunologic: Negative for HIV exposure and persistent infections.    Prior CV studies:   The following studies were reviewed today:  Zio monitor:  The patient wore the monitor for 6 days 20 hours starting July 21, 2020. Indication: Dizziness and giddiness The minimum heart rate was 43 bpm, maximum heart rate was 154 bpm, and average heart rate was 58 bpm. Predominant underlying rhythm was Sinus  Rhythm.  24 Supraventricular Tachycardia runs occurred, the run with the fastest interval lasting 5 beats with a max rate of 154 bpm, the longest lasting 7 beats with an avg rate of 102 bpm.  Premature atrial complexes were occasional (2.2%, 12429). Premature Ventricular complexeswere occasional (1.3%, 7174).  No pauses, No AV block and no atrial fibrillation present.  4 patient triggered events are associated with premature ventricular complex and premature atrial complex. 3 diary events associated with premature atrial complex.  Conclusion: This study is remarkable for the following: 1. Paroxysmal supraventricular tachycardia which is likely atrial tachycardia with variable  block. 2. Occasional premature atrial complexes. 3. Occasional premature ventricular complex.    TTE IMPRESSIONS  1. Left ventricular ejection fraction, by estimation, is 60 to 65%. The left ventricle has normal function. The left ventricle has no regional wall motion abnormalities. Left ventricular diastolic parameters are  consistent with Grade I diastolic dysfunction (impaired relaxation).  2. Right ventricular systolic function is normal. The right ventricular size is normal. Tricuspid regurgitation signal is inadequate for assessing PA pressure.  3. The mitral valve is grossly normal. Mild mitral valve regurgitation. No evidence of mitral stenosis.  4. The aortic valve is grossly normal. Aortic valve regurgitation is not  visualized. No aortic stenosis is present.  5. The inferior vena cava is normal in size with <50% respiratory  variability, suggesting right atrial pressure of 8 mmHg.   FINDINGS  Left Ventricle: Left ventricular ejection fraction, by estimation, is 60 to 65%. The left ventricle has normal function. The left ventricle has no regional wall motion abnormalities. The left ventricular internal cavity size was normal in size. There is  no left ventricular hypertrophy. Left ventricular diastolic parameters are consistent with Grade I diastolic dysfunction (impaired relaxation).   Right Ventricle: The right ventricular size is normal. No increase in right ventricular wall thickness. Right ventricular systolic function is normal. Tricuspid regurgitation signal is inadequate for assessing PA pressure.   Left Atrium: Left atrial size was normal in size.   Right Atrium: Right atrial size was normal in size.   Pericardium: Trivial pericardial effusion is present. Presence of pericardial fat pad.   Mitral Valve: The mitral valve is grossly normal. Mild mitral valve  regurgitation. No evidence of mitral valve  stenosis.   Tricuspid Valve: The tricuspid valve is grossly normal. Tricuspid valve  regurgitation is trivial. No evidence of tricuspid stenosis.   Aortic Valve: The aortic valve is grossly normal. Aortic valve regurgitation is not visualized. No aortic stenosis is present.   Pulmonic Valve: The pulmonic valve was grossly normal. Pulmonic valve regurgitation is not visualized. No evidence of pulmonic stenosis.   Aorta: The aortic root is normal in size and structure.   Venous: The inferior vena cava is normal in size with less than 50% respiratory variability, suggesting right atrial pressure of 8 mmHg.   IAS/Shunts: The atrial septum is grossly normal.    Labs/Other Tests and Data Reviewed:    EKG: None today  Recent Labs: 06/08/2020: B Natriuretic Peptide 297.3 06/25/2020: Hemoglobin 13.6; Platelets 326 07/25/2020: Magnesium 1.6; Pro B Natriuretic peptide (BNP) 93.0 08/09/2020: ALT 100; BUN 21; Creatinine, Ser 0.65; Potassium 4.0; Sodium 141   Recent Lipid Panel Lab Results  Component Value Date/Time   TRIG 84 06/08/2020 02:25 AM    Wt Readings from Last 3 Encounters:  09/06/20 179 lb 3.2 oz (81.3 kg)  08/24/20 179 lb (81.2 kg)  08/14/20 178 lb (80.7 kg)  Objective:    Vital Signs:  BP 125/60   Pulse (!) 53   Ht '5\' 1"'  (1.549 m)   Wt 179 lb 3.2 oz (81.3 kg)   SpO2 100%   BMI 33.86 kg/m    ASSESSMENT & PLAN:    Hypertension  Her blood pressure is acceptable.  She tells me she is very happy with her improvement her blood pressure at home has been in the 130s and 120s.  She does report that sometimes her heart rate drops to the 40s and goes back up.  She has no dizziness no lightheadedness at those times.  The patient understands the need to lose weight with diet and exercise. We have discussed specific strategies for this.  We discussed and she decided shared decision she will continue her current medication regimen given the fact that her blood pressure is  now at goal she is not symptomatic with her intermittent heart rate drop.  COVID-19 Education: The signs and symptoms of COVID-19 were discussed with the patient and how to seek care for testing (follow up with PCP or arrange E-visit).  The importance of social distancing was discussed today.  Time:  Today, I have spent 10 minutes with the patient with telehealth technology discussing the above problems.     Medication Adjustments/Labs and Tests Ordered: Current medicines are reviewed at length with the patient today.  Concerns regarding medicines are outlined above.   Tests Ordered: No orders of the defined types were placed in this encounter.  Medication Changes: No orders of the defined types were placed in this encounter.   Follow Up:  Rolly Pancake, DO  09/06/2020 2:30 PM    [place]

## 2020-09-13 ENCOUNTER — Ambulatory Visit: Payer: BC Managed Care – PPO | Admitting: Medical

## 2020-10-26 ENCOUNTER — Other Ambulatory Visit: Payer: Self-pay

## 2020-10-26 ENCOUNTER — Encounter (HOSPITAL_BASED_OUTPATIENT_CLINIC_OR_DEPARTMENT_OTHER): Payer: Self-pay | Admitting: Emergency Medicine

## 2020-10-26 ENCOUNTER — Emergency Department (HOSPITAL_BASED_OUTPATIENT_CLINIC_OR_DEPARTMENT_OTHER): Payer: BC Managed Care – PPO

## 2020-10-26 ENCOUNTER — Emergency Department (HOSPITAL_BASED_OUTPATIENT_CLINIC_OR_DEPARTMENT_OTHER)
Admission: EM | Admit: 2020-10-26 | Discharge: 2020-10-26 | Disposition: A | Discharge status: Home / Self Care | Payer: BC Managed Care – PPO | Attending: Emergency Medicine | Admitting: Emergency Medicine

## 2020-10-26 DIAGNOSIS — I110 Hypertensive heart disease with heart failure: Secondary | ICD-10-CM | POA: Diagnosis not present

## 2020-10-26 DIAGNOSIS — H43391 Other vitreous opacities, right eye: Secondary | ICD-10-CM | POA: Diagnosis not present

## 2020-10-26 DIAGNOSIS — Z7982 Long term (current) use of aspirin: Secondary | ICD-10-CM | POA: Diagnosis not present

## 2020-10-26 DIAGNOSIS — R059 Cough, unspecified: Secondary | ICD-10-CM | POA: Diagnosis not present

## 2020-10-26 DIAGNOSIS — Z20822 Contact with and (suspected) exposure to covid-19: Secondary | ICD-10-CM | POA: Diagnosis not present

## 2020-10-26 DIAGNOSIS — R079 Chest pain, unspecified: Secondary | ICD-10-CM | POA: Diagnosis not present

## 2020-10-26 DIAGNOSIS — R490 Dysphonia: Secondary | ICD-10-CM | POA: Insufficient documentation

## 2020-10-26 DIAGNOSIS — I509 Heart failure, unspecified: Secondary | ICD-10-CM | POA: Diagnosis not present

## 2020-10-26 DIAGNOSIS — R0989 Other specified symptoms and signs involving the circulatory and respiratory systems: Secondary | ICD-10-CM | POA: Insufficient documentation

## 2020-10-26 DIAGNOSIS — Z8616 Personal history of COVID-19: Secondary | ICD-10-CM | POA: Insufficient documentation

## 2020-10-26 DIAGNOSIS — R0981 Nasal congestion: Secondary | ICD-10-CM | POA: Insufficient documentation

## 2020-10-26 DIAGNOSIS — Z79899 Other long term (current) drug therapy: Secondary | ICD-10-CM | POA: Diagnosis not present

## 2020-10-26 LAB — BASIC METABOLIC PANEL
Anion gap: 11 (ref 5–15)
BUN: 40 mg/dL — ABNORMAL HIGH (ref 8–23)
CO2: 24 mmol/L (ref 22–32)
Calcium: 9.2 mg/dL (ref 8.9–10.3)
Chloride: 106 mmol/L (ref 98–111)
Creatinine, Ser: 0.89 mg/dL (ref 0.44–1.00)
GFR, Estimated: >60 mL/min (ref >60)
Glucose, Bld: 114 mg/dL — ABNORMAL HIGH (ref 70–99)
Potassium: 3.4 mmol/L — ABNORMAL LOW (ref 3.5–5.1)
Sodium: 141 mmol/L (ref 135–145)

## 2020-10-26 LAB — CBC
HCT: 41.5 % (ref 36.0–46.0)
Hemoglobin: 14 g/dL (ref 12.0–15.0)
MCH: 28.9 pg (ref 26.0–34.0)
MCHC: 33.7 g/dL (ref 30.0–36.0)
MCV: 85.7 fL (ref 80.0–100.0)
Platelets: 300 10*3/uL (ref 150–400)
RBC: 4.84 MIL/uL (ref 3.87–5.11)
RDW: 12.4 % (ref 11.5–15.5)
WBC: 10.6 10*3/uL — ABNORMAL HIGH (ref 4.0–10.5)
nRBC: 0 % (ref 0.0–0.2)

## 2020-10-26 LAB — TROPONIN I (HIGH SENSITIVITY): Troponin I (High Sensitivity): 8 ng/L (ref <18)

## 2020-10-26 NOTE — Discharge Instructions (Addendum)
You were evaluated in the Emergency Department and after careful evaluation, we did not find any emergent condition requiring admission or further testing in the hospital.  In terms of the floater in your eye, please make sure to follow-up with the eye doctor whose contact information I have provided in your discharge paperwork.  They can work an appointment in for you next week.  Please return to the Emergency Department if you experience any worsening of your condition.  We encourage you to follow up with a primary care provider.  Thank you for allowing [place] to be a part of your care.

## 2020-10-26 NOTE — ED Provider Notes (Signed)
[facility] EMERGENCY DEPARTMENT Provider Note   CSN: [id number] Arrival date & time: 10/26/20  1003     History Chief Complaint  Patient presents with  . Chest Pain    Covid exposure    Latoya Rios is a 65 y.o. female.  HPI 65 year old female with a history of complications with [id number] including hypoxemic respiratory failure, DIC, CHF with an EF of 60 to 65% as of September 2021, hypertension, presents to the ER with complaints of some voice hoarseness, cough, nasal congestion and an episode of chest pain which occurred earlier this morning.  Patient states that she was exposed to a family member who had COVID-26 on Sunday, family member tested positive on Wednesday.  She states she woke up this morning with a scratchy/hoarse throat, feeling some chest congestion, and an episode of severe chest burning bilaterally in her upper chest for about 10 minutes.  This occurred at about 330 this morning, she states it feels like someone had put a hot iron into her chest.  She had checked her blood pressure and this was also elevated with a systolic in the 854O.  Episode lasted about 10 minutes and then subsided.  She has had no chest pain since.  She denies any shortness of breath.  No known fevers.  She is not vaccinated for Covid, but has gotten Covid before and had to be hospitalized.  She denies any nausea, abdominal pain, dysuria, hematuria radiating pain.  She also states that when she was walking into the emergency department, she developed a floater in her right outer eye.  She states that it moves with her vision, however denies any visual changes.  She has no pain to the eye.  This is never happened before.  She reports that she does not have an ophthalmologist.  I also spoke with the patient's daughter on the phone.  Patient's daughter was concerned as patient had severe complications from [id number]-00 and had developed DIC and respiratory failure.  They were concerned that given the  recent exposure, she could be having similar complications to this.  They contacted Dr. Harriet Masson which is her cardiologist and she recommended that she be evaluated in the emergency department    Past Medical History:  Diagnosis Date  . Abnormal renal function 03/01/2019   Formatting of this note is different from the original. eGFR  >=60.0 mL/min/1.62m2 >60.0  31.2Low  CM  >60.0 CM     Last Assessment & Plan:  Formatting of this note might be different from the original. Resolved as of last labs Rechecking labs to ensure stability  . Accelerated hypertension 06/08/2020  . Acute cystitis without hematuria 06/24/2020  . Acute hypoxemic respiratory failure due to COVID-19 ([facility] 06/08/2020  . Acute kidney injury ([facility] 06/23/2020  . AKI (acute kidney injury) ([facility] 06/24/2020  . ARF (acute renal failure) ([facility] 06/24/2020  . Benign essential hypertension 05/29/2016   Last Assessment & Plan:  Formatting of this note is different from the original. Pertinent Data:   Current medication includes: carvediloL - 3.125 mg losartan-hydroCHLOROthiazide - 100-25 mg .  BP Readings from Last 3 Encounters:  02/29/20 (!) 206/86  08/31/19 (!) 142/78  08/16/19 (!) 160/56   LDL Calculated (mg/dL)  Date Value  08/31/2019 144 (H)  03/01/2019 156 (H)   Creatinine (mg/dL)  Date Val  . CHF (congestive heart failure) ([place]   . Class 2 severe obesity due to excess calories with serious comorbidity and body mass index (BMI) of 37.0  to 37.9 in adult [facility]) 05/29/2016   Formatting of this note is different from the original. Wt Readings from Last 3 Encounters:  08/31/19 233 lb  08/16/19 239 lb  03/01/19 249 lb    Last Assessment & Plan:  Formatting of this note might be different from the original. BMI Assessment: Current Body mass index is 37.22 kg/m.  Patient BMI currently is above average (>25 kg/m2); BMI follow up plan is completed . Current barriers to heal  . Demand ischemia ([place]) 06/08/2020  . Drug declined by patient 02/29/2020   Last  Assessment & Plan:  Formatting of this note might be different from the original. Patient historically has declined statins and at some point was on losartan/HCTZ and carvedilol, but stopped taking them stating " I dont want to take medications ".  Today I again discussed with patient the risks of uncontrolled HYPERLIPIDEMIA and HYPERTENSION including but not limited to strokes, MIs, renal in  . Encounter for preventative adult health care exam with abnormal findings 06/12/2017   Last Assessment & Plan:  Formatting of this note might be different from the original. Patient for wellness visit.  Overall doing good. No new concerns. Reviewed appropriate age screenings and vaccinations.  Influenza vaccine: Patient Declined Tdap/TD: Patient Declined Zoster vaccine: Patient Declined  Colonoscopy: UTD, due 2022 Mammogram: UTD  Depression screening: negative No falls reported  Dis  . Gynecologic exam normal 08/16/2019   Last Assessment & Plan:  Formatting of this note might be different from the original. Pap smear with high risk hpv collected for screening Discussed and encouraged healthy lifestyle.  Always use sunscreen when sun exposed.  Encouraged healthy diet with fresh fruits, vegetables, and lean meat.  Encouraged regular exercise for heart health and bone health.  Encouraged continue monthly SBE and yearl  . Heart murmur   . Hypertension   . Hypotension due to drugs 06/22/2020  . Mixed hyperlipidemia   . Obese   . Obesity (BMI 30-39.9) 06/08/2020  . Pneumonia due to COVID-19 virus 06/08/2020    Patient Active Problem List   Diagnosis Date Noted  . Acute cystitis without hematuria 06/24/2020  . ARF (acute renal failure) ([place]) 06/24/2020  . AKI (acute kidney injury) ([place]) 06/24/2020  . Acute kidney injury ([place]) 06/23/2020  . Hypotension due to drugs 06/22/2020  . CHF (congestive heart failure) ([place])   . Heart murmur   . Hypertension   . Obese   . Acute hypoxemic respiratory failure due to COVID-19  ([place]) 06/08/2020  . Pneumonia due to COVID-19 virus 06/08/2020  . Accelerated hypertension 06/08/2020  . Obesity (BMI 30-39.9) 06/08/2020  . Demand ischemia ([place]) 06/08/2020  . Drug declined by patient 02/29/2020  . Gynecologic exam normal 08/16/2019  . Abnormal renal function 03/01/2019  . Encounter for preventative adult health care exam with abnormal findings 06/12/2017  . Mixed hyperlipidemia 05/29/2016  . Benign essential hypertension 05/29/2016  . Class 2 severe obesity due to excess calories with serious comorbidity and body mass index (BMI) of 37.0 to 37.9 in adult [facility]) 05/29/2016    Past Surgical History:  Procedure Laterality Date  . COLONOSCOPY    . DENTAL SURGERY       OB History    Gravida  1   Para  1   Term      Preterm  1   AB      Living  1     SAB      IAB  Ectopic      Multiple      Live Births  1           Family History  Problem Relation Age of Onset  . Emphysema Mother   . Lung disease Mother   . Brain cancer Father     Social History   Tobacco Use  . Smoking status: Never Smoker  . Smokeless tobacco: Never Used  Substance Use Topics  . Alcohol use: Never  . Drug use: Never    Home Medications Prior to Admission medications   Medication Sig Start Date End Date Taking? Authorizing Provider  aspirin EC 81 MG EC tablet Take 1 tablet (81 mg total) by mouth daily. Swallow whole. 06/13/20   Lavina Hamman, MD  carvedilol (COREG) 6.25 MG tablet Take 1 tablet (6.25 mg total) by mouth 2 (two) times daily with a meal. 08/14/20 10/13/20  Tobb, Kardie, DO  Cholecalciferol (VITAMIN D3) 10 MCG (400 UNIT) CAPS Take by mouth.    [provider]  losartan (COZAAR) 50 MG tablet 2 tab po q day 08/14/20   Tobb, Kardie, DO  triamterene-hydrochlorothiazide (MAXZIDE-25) 37.5-25 MG tablet Take 1 tablet by mouth daily. Dc chlorthalidone 08/14/20   Tobb, Kardie, DO    Allergies    Clonidine, Nisoldipine, Penicillins, Quinacrine,  and Quinapril  Review of Systems   Review of Systems  Constitutional: Negative for chills and fever.  HENT: Negative for ear pain and sore throat.   Eyes: Negative for pain and visual disturbance.  Respiratory: Positive for cough. Negative for shortness of breath.   Cardiovascular: Positive for chest pain. Negative for palpitations.  Gastrointestinal: Negative for abdominal pain and vomiting.  Genitourinary: Negative for dysuria and hematuria.  Musculoskeletal: Negative for arthralgias and back pain.  Skin: Negative for color change and rash.  Neurological: Negative for seizures and syncope.  All other systems reviewed and are negative.   Physical Exam Updated Vital Signs BP (!) 162/57 (BP Location: Left Arm)   Pulse 64   Temp 98.3 F (36.8 C) (Oral)   Resp (!) 21   Ht _0  (1.549 m)   Wt 81.3 kg   SpO2 100%   BMI 33.86 kg/m   Physical Exam Vitals and nursing note reviewed.  Constitutional:      General: She is not in acute distress.    Appearance: She is well-developed and well-nourished. She is not ill-appearing or diaphoretic.  HENT:     Head: Normocephalic and atraumatic.  Eyes:     Conjunctiva/sclera: Conjunctivae normal.     Comments: Pupils equal and reactive, EOMs intact.  Gross vision intact.  No evidence of hyphema, hemorrhage.  Bedside ultrasound performed by Dr. Regenia Skeeter which did not show any clear evidence of retinal detachment  Cardiovascular:     Rate and Rhythm: Normal rate and regular rhythm.     Pulses:          Radial pulses are 2+ on the right side and 2+ on the left side.     Heart sounds: No murmur heard.   Pulmonary:     Effort: Pulmonary effort is normal. No respiratory distress.     Breath sounds: Normal breath sounds. No decreased breath sounds, wheezing, rhonchi or rales.  Chest:     Chest wall: No edema.  Abdominal:     Palpations: Abdomen is soft.     Tenderness: There is no abdominal tenderness.  Musculoskeletal:        General:  No edema.  Normal range of motion.     Cervical back: Neck supple.     Right lower leg: No tenderness. No edema.     Left lower leg: No tenderness. No edema.  Skin:    General: Skin is warm and dry.  Neurological:     General: No focal deficit present.     Mental Status: She is alert.     Comments: Mental Status:  Alert, thought content appropriate, able to give a coherent history. Speech fluent without evidence of aphasia. Able to follow 2 step commands without difficulty.  Cranial Nerves:  II: Peripheral visual fields grossly normal, pupils equal, round, reactive to light III,IV, VI: ptosis not present, extra-ocular motions intact bilaterally  V,VII: smile symmetric, facial light touch sensation equal VIII: hearing grossly normal to voice  X: uvula elevates symmetrically  XI: bilateral shoulder shrug symmetric and strong XII: midline tongue extension without fassiculations Motor:  Normal tone. 5/5 strength of BUE and BLE major muscle groups including strong and equal grip strength and dorsiflexion/plantar flexion Sensory: light touch normal in all extremities. Cerebellar: normal finger-to-nose with bilateral upper extremities, Romberg sign absent Gait: normal gait and balance   Psychiatric:        Mood and Affect: Mood and affect and mood normal.        Behavior: Behavior normal.     ED Results / Procedures / Treatments   Labs (all labs ordered are listed, but only abnormal results are displayed) Labs Reviewed  BASIC METABOLIC PANEL - Abnormal; Notable for the following components:      Result Value   Potassium 3.4 (*)    Glucose, Bld 114 (*)    BUN 40 (*)    All other components within normal limits  CBC - Abnormal; Notable for the following components:   WBC 10.6 (*)    All other components within normal limits  SARS CORONAVIRUS 2 (TAT 6-24 HRS)  TROPONIN I (HIGH SENSITIVITY)    EKG EKG Interpretation  Date/Time:  Thursday October 26 2020 10:18:48  EST Ventricular Rate:  73 PR Interval:  144 QRS Duration: 142 QT Interval:  440 QTC Calculation: 484 R Axis:   42 Text Interpretation: Normal sinus rhythm Left bundle branch block Confirmed by Sherwood Gambler [phone]) on 10/26/2020 2:59:51 PM   Radiology DG Chest Portable 1 View  Result Date: 10/26/2020 CLINICAL DATA:  Chest pain EXAM: PORTABLE CHEST 1 VIEW COMPARISON:  06/23/2020 FINDINGS: The heart size and mediastinal contours are within normal limits. Both lungs are clear. The visualized skeletal structures are unremarkable. IMPRESSION: No active disease. Electronically Signed   By: Franchot Gallo M.D.   On: 10/26/2020 10:52    Procedures Procedures   Medications Ordered in ED Medications - No data to display  ED Course  I have reviewed the triage vital signs and the nursing notes.  Pertinent labs & imaging results that were available during my care of the patient were reviewed by me and considered in my medical decision making (see chart for details).    MDM Rules/Calculators/A&P                         65 year old female who presents to the ER with complaints of chest pain and right eye floater.  On arrival, he patient is very well-appearing, in no acute distress, resting comfortably in the ER bed  Vitals on arrival with a blood pressure 191/85, afebrile, not tachycardic, tachypneic or hypoxic.  Blood pressures did  improve throughout her ED stay.  No evidence of hypoxia here.  She is ambulating about the room with no difficulty.  Physical exam with clear lung sounds,no neuro deficits on exam, no noticeable edema to her lower extremities.  Physical exam of the eye reveals PERRLA, EOMs intact, bedside ultrasound performed by Dr. Regenia Skeeter with no clear evidence of retinal detachment.  Vision is grossly intact.  Labs ordered in triage, reviewed by me overall reassuring. -CBC with a mild leukocytosis of 10.6, stable hemoglobin, no other significant abnormalities noted. -BMP with  mild hypokalemia 3.4, no significant electrode abnormalities, BUN of 40 could be an indication of dehydration.  Normal anion gap.  Imaging ordered in triage, reviewed and interpreted by myself and radiology -No evidence of pneumonia or fluid overload  EKG reviewed by myself my supervising physician Dr. Regenia Skeeter -Normal sinus rhythm with left bundle branch block which per chart review appears to be at baseline  MDM: Overall work-up reassuring.  Patient had a episode of chest pain at around 330 this morning, has had no episodes since then.  Her initial troponin is negative, do not think she needs a second 1 at this time.  Chest x-ray without evidence of fluid overload or pneumonia.  She is not hypoxic, not tachycardic.  No evidence of PE at this time. No significant electrolyte abnormalities, low suspicion for ACS.  I did speak with Dr. Baird Cancer with ophthalmology given her floater, he recommends given no visual changes that she is stable to follow-up in the office with them.  Patient does not have an ophthalmologist, will provide referral  I discussed the reassuring work-up with the patient and her daughter.  Given reassurance, they agreeable to discharge with close follow-up with her PCP and cardiologist.  Blood pressure is improved.  Patient is overall feeling well and well-appearing.  We discussed strict return precautions, all of her questions evidence to her satisfaction, she voiced understanding and is agreeable to discharge.  At this stage in the ED course, the patient is medically screened and stable for discharge.  This was a shared visit with my supervising physician Dr. Regenia Skeeter who independently saw and evaluated the patient & provided guidance in evaluation/management/disposition ,in agreement with care  Final Clinical Impression(s) / ED Diagnoses Final diagnoses:  Chest pain, unspecified type  Floaters, right    Rx / DC Orders ED Discharge Orders    None       Garald Balding, PA-C 10/26/20 1558    Sherwood Gambler, MD 10/26/20 1621

## 2020-10-26 NOTE — ED Provider Notes (Signed)
Ultrasound ED Ocular  Date/Time: 10/26/2020 4:19 PM Performed by: Pricilla Loveless, MD Authorized by: Pricilla Loveless, MD   PROCEDURE DETAILS:    Indications: visual change     Assessed:  Right eye   Images: archived     Limitations:  None Comments:     There is perhaps a small hyperechoic area that could represent vitreous abnormality.  I do not see a distinct line that would indicate an obvious retinal detachment.      Pricilla Loveless, MD 10/26/20 1620

## 2020-10-26 NOTE — ED Notes (Signed)
Pt also reports that when she left the house to come here she noticed a dark speck in her periphery of the R eye. Denies pain.

## 2020-10-26 NOTE — ED Triage Notes (Signed)
Pt states she has exposure to covid since Sunday.  Started having hoarseness and chest burning (for 10 min) since this am.  Daughter was concerned as her blood pressure was high this am.  198/80, 172/70.  Pt denies burning currently

## 2020-10-26 NOTE — ED Notes (Signed)
Pt discharged home after verbalizing understanding of discharge instructions; nad noted. 

## 2020-10-27 LAB — SARS CORONAVIRUS 2 (TAT 6-24 HRS): SARS Coronavirus 2: NEGATIVE

## 2020-11-15 IMAGING — DX DG CHEST 1V PORT
1 series · 1 of 1 positions shown · non-contrast
Comparison: 06/08/2020

CLINICAL DATA: Shortness of breath, history of 1ZPT8-8E positivity

EXAM:
PORTABLE CHEST 1 VIEW

[chest ap]
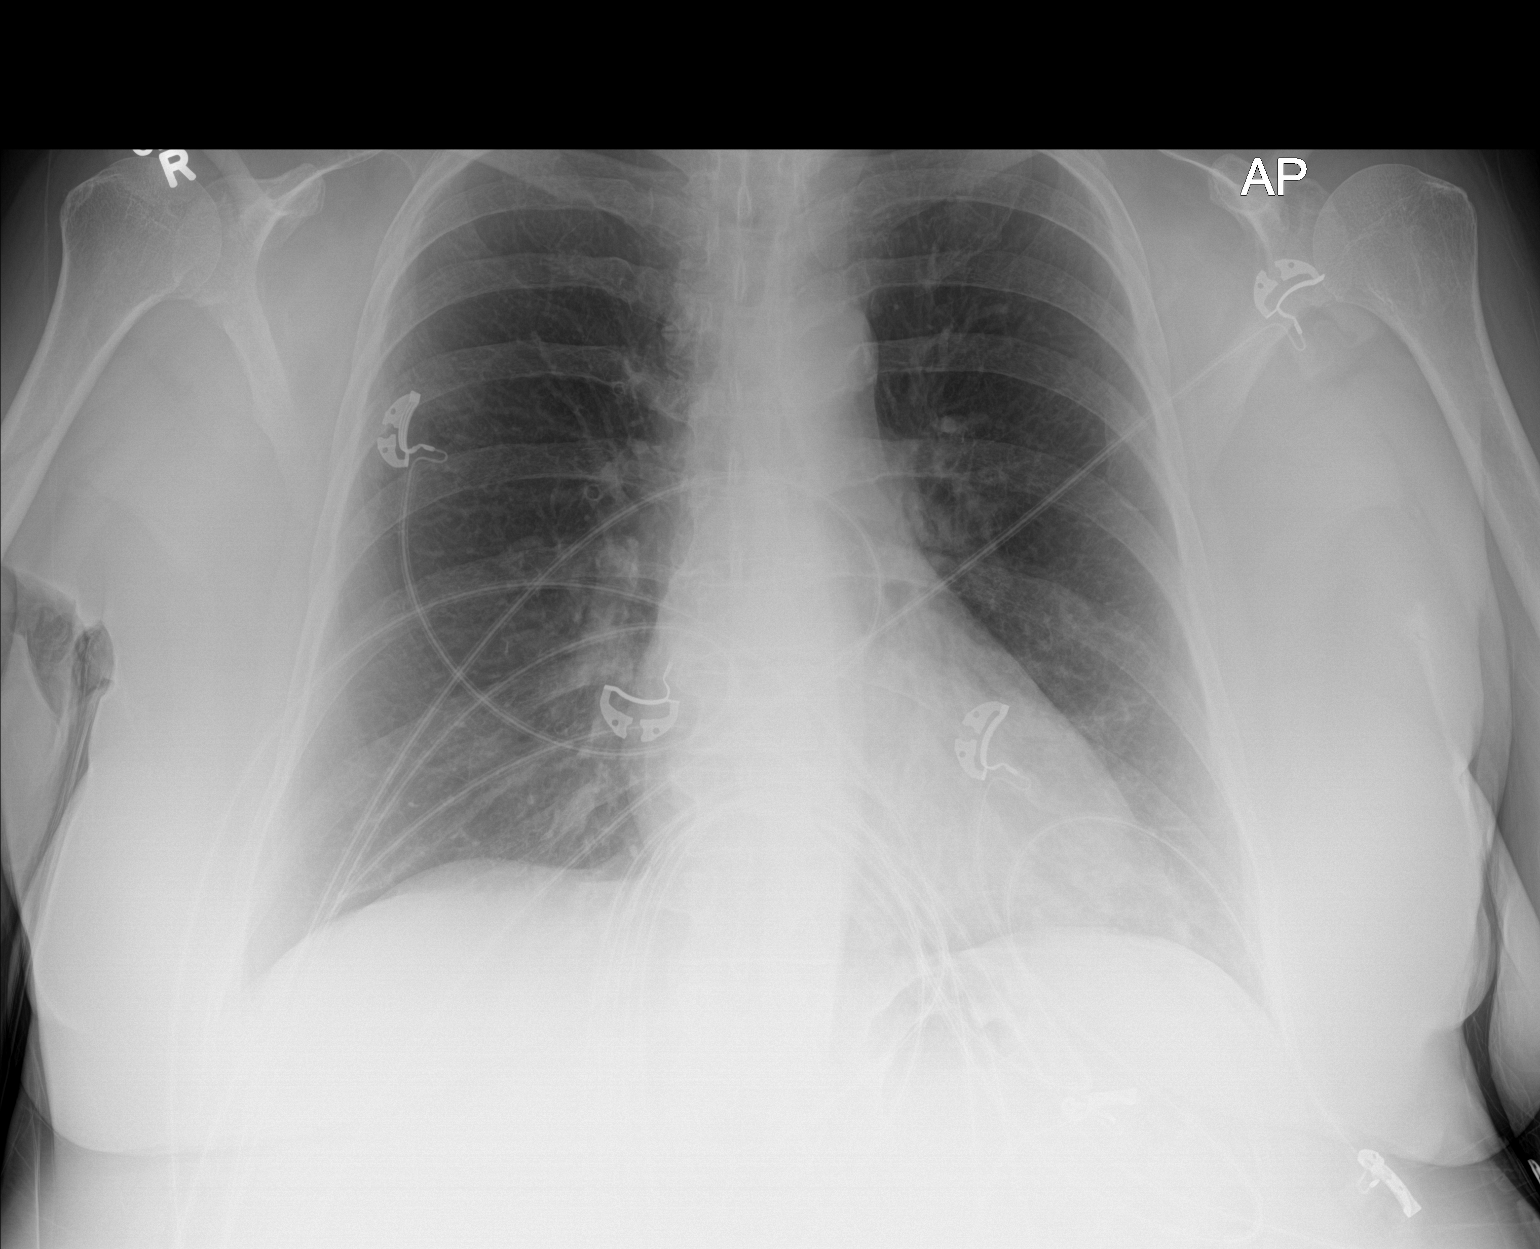

[1 of 1 positions shown; findings below may reference images not displayed]

FINDINGS: Cardiac shadow is within normal limits. The lungs are well aerated
bilaterally. Minimal patchy airspace disease is noted in left lung
base. The overall inspiratory effort has improved significantly in
the interval from the prior exam. No other focal abnormality is
noted.
IMPRESSION: Significant improved aeration bilaterally. Minimal atelectatic
changes are seen in the left base.

## 2020-12-02 ENCOUNTER — Ambulatory Visit (HOSPITAL_BASED_OUTPATIENT_CLINIC_OR_DEPARTMENT_OTHER)
Admission: RE | Admit: 2020-12-02 | Discharge: 2020-12-02 | Disposition: A | Discharge status: Home / Self Care | Payer: BC Managed Care – PPO | Source: Ambulatory Visit | Attending: Medical | Admitting: Medical

## 2020-12-02 ENCOUNTER — Other Ambulatory Visit (HOSPITAL_BASED_OUTPATIENT_CLINIC_OR_DEPARTMENT_OTHER): Payer: Self-pay | Admitting: Medical

## 2020-12-02 ENCOUNTER — Other Ambulatory Visit: Payer: Self-pay

## 2020-12-02 DIAGNOSIS — Z1231 Encounter for screening mammogram for malignant neoplasm of breast: Secondary | ICD-10-CM | POA: Diagnosis not present

## 2020-12-04 ENCOUNTER — Encounter: Payer: Self-pay | Admitting: Cardiology

## 2020-12-04 ENCOUNTER — Telehealth (INDEPENDENT_AMBULATORY_CARE_PROVIDER_SITE_OTHER): Payer: BC Managed Care – PPO | Admitting: Cardiology

## 2020-12-04 VITALS — BP 138/58 | HR 47 | Ht 61.0 in | Wt 179.8 lb

## 2020-12-04 DIAGNOSIS — R072 Precordial pain: Secondary | ICD-10-CM

## 2020-12-04 DIAGNOSIS — I10 Essential (primary) hypertension: Secondary | ICD-10-CM

## 2020-12-04 MED ORDER — NITROGLYCERIN 0.4 MG SL SUBL: 0.4000 mg | SUBLINGUAL_TABLET | SUBLINGUAL | 6 refills | Status: DC | PRN

## 2020-12-04 NOTE — Progress Notes (Addendum)
Telehealth visit video visit  Date:  12/04/2020   ID:  Latoya Rios, DOB 29-May-1956, MRN [id number]  The patient is at home with her daughter and consent to her telehealth visit.  I am in the office.  PCP:  Mackie Pai, PA-C  Cardiologist:  Berniece Salines, DO Electrophysiologist:  None   Evaluation Performed: Follow-up visit  Chief Complaint: Recently had chest pain and it was diagnosed also with Covid  History of Present Illness:    Latoya Rios is a 65 y.o. female with hypertension here today for follow-up visit.  I saw the patient in October 2021 at that time she was hypotensive therefore I decreased her hydralazine and stopped the Aldactone.  Her visit on July 21, 2020 the patient reported that she had been experiencing some dizziness and was concerned about this. I placed a monitor on the patient and also had blood pressure cuff mailed to the patient and encouraged her to take her blood pressure sitting and standing.  I last saw the patient on August 14, 2020 at that time her blood pressure was elevated I increased her carvedilol to 6.25 mg twice a day.  We talked about her monitor results which show occasional PACs, PVCs as well as paroxysmal atrial tachycardia.  She also had questions about exercise as and we discussed this.  I last saw the patient via video visit on September 06, 2020 at that time she appeared to be doing well.  No changes were made in her medications.  Since I last saw the patient she has had COVID-19 infection.  She also has been experiencing intermittent chest pain.  She described as a left-sided chest discomfort which sometimes feels like a burning sensation.  She notes that 1 day she visited it lasted for 10 minutes and then resolved.  She has not had such pain ever since that time.  She is concerned because her brother died at age 65 of a premature myocardial infarction.  There has been some other family members with MIs.  The patient does not have  symptoms concerning for COVID-19 infection.  Past Medical History:  Diagnosis Date  . Abnormal renal function 03/01/2019   Formatting of this note is different from the original. eGFR  >=60.0 mL/min/1.46m2 >60.0  31.2Low  CM  >60.0 CM     Last Assessment & Plan:  Formatting of this note might be different from the original. Resolved as of last labs Rechecking labs to ensure stability  . Accelerated hypertension 06/08/2020  . Acute cystitis without hematuria 06/24/2020  . Acute hypoxemic respiratory failure due to COVID-19 ([facility] 06/08/2020  . Acute kidney injury ([facility] 06/23/2020  . AKI (acute kidney injury) ([facility] 06/24/2020  . ARF (acute renal failure) ([facility] 06/24/2020  . Benign essential hypertension 05/29/2016   Last Assessment & Plan:  Formatting of this note is different from the original. Pertinent Data:   Current medication includes: carvediloL - 3.125 mg losartan-hydroCHLOROthiazide - 100-25 mg .  BP Readings from Last 3 Encounters:  02/29/20 (!) 206/86  08/31/19 (!) 142/78  08/16/19 (!) 160/56   LDL Calculated (mg/dL)  Date Value  08/31/2019 144 (H)  03/01/2019 156 (H)   Creatinine (mg/dL)  Date Val  . CHF (congestive heart failure) ([facility]   . Class 2 severe obesity due to excess calories with serious comorbidity and body mass index (BMI) of 37.0 to 37.9 in adult ([facility] 05/29/2016   Formatting of this note is different from the original. Wt Readings from Last 3 Encounters:  08/31/19 233 lb  08/16/19 239 lb  03/01/19 249 lb    Last Assessment & Plan:  Formatting of this note might be different from the original. BMI Assessment: Current Body mass index is 37.22 kg/m.  Patient BMI currently is above average (>25 kg/m2); BMI follow up plan is completed . Current barriers to heal  . Demand ischemia ([place]) 06/08/2020  . Drug declined by patient 02/29/2020   Last Assessment & Plan:  Formatting of this note might be different from the original. Patient historically has declined statins and at some point was on  losartan/HCTZ and carvedilol, but stopped taking them stating " I dont want to take medications ".  Today I again discussed with patient the risks of uncontrolled HYPERLIPIDEMIA and HYPERTENSION including but not limited to strokes, MIs, renal in  . Encounter for preventative adult health care exam with abnormal findings 06/12/2017   Last Assessment & Plan:  Formatting of this note might be different from the original. Patient for wellness visit.  Overall doing good. No new concerns. Reviewed appropriate age screenings and vaccinations.  Influenza vaccine: Patient Declined Tdap/TD: Patient Declined Zoster vaccine: Patient Declined  Colonoscopy: UTD, due 2022 Mammogram: UTD  Depression screening: negative No falls reported  Dis  . Gynecologic exam normal 08/16/2019   Last Assessment & Plan:  Formatting of this note might be different from the original. Pap smear with high risk hpv collected for screening Discussed and encouraged healthy lifestyle.  Always use sunscreen when sun exposed.  Encouraged healthy diet with fresh fruits, vegetables, and lean meat.  Encouraged regular exercise for heart health and bone health.  Encouraged continue monthly SBE and yearl  . Heart murmur   . Hypertension   . Hypotension due to drugs 06/22/2020  . Mixed hyperlipidemia   . Obese   . Obesity (BMI 30-39.9) 06/08/2020  . Pneumonia due to COVID-19 virus 06/08/2020   Past Surgical History:  Procedure Laterality Date  . COLONOSCOPY    . DENTAL SURGERY       Current Meds  Medication Sig  . aspirin EC 81 MG EC tablet Take 1 tablet (81 mg total) by mouth daily. Swallow whole.  . carvedilol (COREG) 6.25 MG tablet Take 1 tablet (6.25 mg total) by mouth 2 (two) times daily with a meal.  . Cholecalciferol (VITAMIN D3) 10 MCG (400 UNIT) CAPS Take by mouth in the morning and at bedtime.  Marland Kitchen losartan (COZAAR) 50 MG tablet 2 tab po q day  . nitroGLYCERIN (NITROSTAT) 0.4 MG SL tablet Place 1 tablet (0.4 mg total) under the  tongue every 5 (five) minutes as needed.  . triamterene-hydrochlorothiazide (MAXZIDE-25) 37.5-25 MG tablet Take 1 tablet by mouth daily. Dc chlorthalidone     Allergies:   Clonidine, Nisoldipine, Penicillins, Quinacrine, and Quinapril   Social History   Tobacco Use  . Smoking status: Never Smoker  . Smokeless tobacco: Never Used  Substance Use Topics  . Alcohol use: Never  . Drug use: Never     Family Hx: The patient's family history includes Brain cancer in her father; Emphysema in her mother; Lung disease in her mother.  ROS:   Review of Systems  Constitution: Negative for decreased appetite, fever and weight gain.  HENT: Negative for congestion, ear discharge, hoarse voice and sore throat.   Eyes: Negative for discharge, redness, vision loss in right eye and visual halos.  Cardiovascular: Negative for chest pain, dyspnea on exertion, leg swelling, orthopnea and palpitations.  Respiratory: Negative for cough,  hemoptysis, shortness of breath and snoring.   Endocrine: Negative for heat intolerance and polyphagia.  Hematologic/Lymphatic: Negative for bleeding problem. Does not bruise/bleed easily.  Skin: Negative for flushing, nail changes, rash and suspicious lesions.  Musculoskeletal: Negative for arthritis, joint pain, muscle cramps, myalgias, neck pain and stiffness.  Gastrointestinal: Negative for abdominal pain, bowel incontinence, diarrhea and excessive appetite.  Genitourinary: Negative for decreased libido, genital sores and incomplete emptying.  Neurological: Negative for brief paralysis, focal weakness, headaches and loss of balance.  Psychiatric/Behavioral: Negative for altered mental status, depression and suicidal ideas.  Allergic/Immunologic: Negative for HIV exposure and persistent infections.    Prior CV studies:   The following studies were reviewed today:   Zio monitor: The patient wore the monitor for 6 days 20 hours starting July 21, 2020. Indication: Dizziness and giddiness The minimum heart rate was 43 bpm, maximum heart rate was 154 bpm, and average heart rate was 58 bpm. Predominant underlying rhythm was Sinus Rhythm.  24 Supraventricular Tachycardia runs occurred, the run with the fastest interval lasting 5 beats with a max rate of 154 bpm, the longest lasting 7 beats with an avg rate of 102 bpm.  Premature atrial complexes were occasional (2.2%, 12429). Premature Ventricular complexeswere occasional (1.3%, 7174).  No pauses, No AV block and no atrial fibrillation present.  4 patient triggered events are associated with premature ventricular complex and premature atrial complex. 3 diary events associated with premature atrial complex.  Conclusion: This study is remarkable for the following: 1. Paroxysmal supraventricular tachycardia which is likely atrial tachycardia with variable block. 2. Occasional premature atrial complexes. 3. Occasional premature ventricular complex.    TTEIMPRESSIONS  1. Left ventricular ejection fraction, by estimation, is 60 to 65%. The left ventricle has normal function. The left ventricle has no regional wall motion abnormalities. Left ventricular diastolic parameters are  consistent with Grade I diastolic dysfunction (impaired relaxation).  2. Right ventricular systolic function is normal. The right ventricular size is normal. Tricuspid regurgitation signal is inadequate for assessing PA pressure.  3. The mitral valve is grossly normal. Mild mitral valve regurgitation. No evidence of mitral stenosis.  4. The aortic valve is grossly normal. Aortic valve regurgitation is not  visualized. No aortic stenosis is present.  5. The inferior vena cava is normal in size with <50% respiratory  variability, suggesting right atrial pressure of 8 mmHg.   FINDINGS  Left Ventricle: Left  ventricular ejection fraction, by estimation, is 60 to 65%. The left ventricle has normal function. The left ventricle has no regional wall motion abnormalities. The left ventricular internal cavity size was normal in size. There is  no left ventricular hypertrophy. Left ventricular diastolic parameters are consistent with Grade I diastolic dysfunction (impaired relaxation).   Right Ventricle: The right ventricular size is normal. No increase in right ventricular wall thickness. Right ventricular systolic function is normal. Tricuspid regurgitation signal is inadequate for assessing PA pressure.   Left Atrium: Left atrial size was normal in size.   Right Atrium: Right atrial size was normal in size.   Pericardium: Trivial pericardial effusion is present. Presence of pericardial fat pad.   Mitral Valve: The mitral valve is grossly normal. Mild mitral valve  regurgitation. No evidence of mitral valve stenosis.   Tricuspid Valve: The tricuspid valve is grossly normal. Tricuspid valve  regurgitation is trivial. No evidence of tricuspid stenosis.   Aortic Valve: The aortic valve is grossly normal. Aortic valve regurgitation is not visualized. No aortic stenosis is  present.   Pulmonic Valve: The pulmonic valve was grossly normal. Pulmonic valve regurgitation is not visualized. No evidence of pulmonic stenosis.   Aorta: The aortic root is normal in size and structure.   Venous: The inferior vena cava is normal in size with less than 50% respiratory variability, suggesting right atrial pressure of 8 mmHg.   IAS/Shunts: The atrial septum is grossly normal.    Labs/Other Tests and Data Reviewed:    EKG:  None today  Recent Labs: 06/08/2020: B Natriuretic Peptide 297.3 07/25/2020: Magnesium 1.6; Pro B Natriuretic peptide (BNP) 93.0 08/09/2020: ALT 100 10/26/2020: BUN 40; Creatinine, Ser 0.89; Hemoglobin 14.0; Platelets 300; Potassium 3.4; Sodium 141   Recent Lipid Panel Lab Results   Component Value Date/Time   TRIG 84 06/08/2020 02:25 AM    Wt Readings from Last 3 Encounters:  12/04/20 179 lb 12.8 oz (81.6 kg)  10/26/20 179 lb 3.2 oz (81.3 kg)  09/06/20 179 lb 3.2 oz (81.3 kg)     Objective:    Vital Signs:  BP (!) 138/58   Pulse (!) 47   Ht '5\' 1"'  (1.549 m)   Wt 179 lb 12.8 oz (81.6 kg)   SpO2 98%   BMI 33.97 kg/m    Unable to perform physical exam this is a virtual visit.  ASSESSMENT & PLAN:    1. Chest pain 2. Hypertension 3. History of recent COVID-19 infection  Her chest pain is concerning patient does have intermediate risk for coronary artery disease would not like to do is pursue an ischemic evaluation in this patient.  Shared decision a coronary CTA at this time is appropriate.  I have discussed with the patient about the testing.  The patient has no IV contrast allergy and is agreeable to proceed with this test.  Sublingual nitroglycerin prescription was sent, its protocol and 911 protocol explained and the patient vocalized understanding questions were answered to the patient's satisfaction  Her blood pressure at this time is acceptable we will continue to monitor the patient.  She is happy with her current medication regimen.  She has improving her exercises.  COVID-19 Education: The signs and symptoms of COVID-19 were discussed with the patient and how to seek care for testing (follow up with PCP or arrange E-visit).  The importance of social distancing was discussed today.  Time:   Today, I have spent 10 minutes with the patient with telehealth technology discussing the above problems.     Medication Adjustments/Labs and Tests Ordered: Current medicines are reviewed at length with the patient today.  Concerns regarding medicines are outlined above.   Tests Ordered: Orders Placed This Encounter  Procedures  . CT CORONARY MORPH W/CTA COR W/SCORE W/CA W/CM &/OR WO/CM  . CT CORONARY FRACTIONAL FLOW RESERVE DATA PREP  . CT CORONARY  FRACTIONAL FLOW RESERVE FLUID ANALYSIS  . Basic metabolic panel    Medication Changes: Meds ordered this encounter  Medications  . nitroGLYCERIN (NITROSTAT) 0.4 MG SL tablet    Sig: Place 1 tablet (0.4 mg total) under the tongue every 5 (five) minutes as needed.    Dispense:  25 tablet    Refill:  6    Follow Up: 6 months or sooner if needed.  Rolly Pancake, DO  12/04/2020 3:17 PM    [facility]

## 2020-12-04 NOTE — Patient Instructions (Signed)
Medication Instructions:  No medication changes. *If you need a refill on your cardiac medications before your next appointment, please call your pharmacy*   Lab Work: Your physician recommends that you return for lab work in: 1 week prior to your CT scan for a BMET. You do not need an appointment or to fast.  If you have labs (blood work) drawn today and your tests are completely normal, you will receive your results only by: Marland Kitchen MyChart Message (if you have MyChart) OR . A paper copy in the mail If you have any lab test that is abnormal or we need to change your treatment, we will call you to review the results.   Testing/Procedures: Your cardiac CT will be scheduled at:   [facility] [place], [place] [address] [phone]   If scheduled at [facility], please arrive at the [facility] main entrance of [facility] 30 minutes prior to test start time. Proceed to the [facility] Radiology Department (first floor) to check-in and test prep.  Please follow these instructions carefully (unless otherwise directed):  On the Night Before the Test: . Be sure to Drink plenty of water. . Do not consume any caffeinated/decaffeinated beverages or chocolate 12 hours prior to your test. . Do not take any antihistamines 12 hours prior to your test.   On the Day of the Test: . Drink plenty of water. Do not drink any water within one hour of the test. . Do not eat any food 4 hours prior to the test. . You may take your regular medications prior to the test.  . FEMALES- please wear underwire-free bra if available      After the Test: . Drink plenty of water. . After receiving IV contrast, you may experience a mild flushed feeling. This is normal. . On occasion, you may experience a mild rash up to 24 hours after the test. This is not dangerous. If this occurs, you can take Benadryl 25 mg and increase your fluid intake. . If you experience trouble  breathing, this can be serious. If it is severe call 911 IMMEDIATELY. If it is mild, please call our office.    Once we have confirmed authorization from your insurance company, we will call you to set up a date and time for your test. Based on how quickly your insurance processes prior authorizations requests, please allow up to 4 weeks to be contacted for scheduling your Cardiac CT appointment. Be advised that routine Cardiac CT appointments could be scheduled as many as 8 weeks after your provider has ordered it.  For non-scheduling related questions, please contact the cardiac imaging nurse navigator should you have any questions/concerns: Marchia Bond, Cardiac Imaging Nurse Navigator Burley Saver, Interim Cardiac Imaging Nurse [place] [facility] Direct Office Dial: [phone]   For scheduling needs, including cancellations and rescheduling, please call Vivien Rota at [phone].     Follow-Up: At [facility], you and your health needs are our priority.  As part of our continuing mission to provide you with exceptional heart care, we have created designated Provider Care Teams.  These Care Teams include your primary Cardiologist (physician) and Advanced Practice Providers (APPs -  Physician Assistants and Nurse Practitioners) who all work together to provide you with the care you need, when you need it.  We recommend signing up for the patient portal called "MyChart".  Sign up information is provided on this After Visit Summary.  MyChart is used to connect with patients for Virtual Visits (Telemedicine).  Patients are able to view lab/test results, encounter notes, upcoming appointments, etc.  Non-urgent messages can be sent to your provider as well.   To learn more about what you can do with MyChart, go to [email or web address].    Your next appointment:   6 month(s)  The format for your next appointment:   In Person  Provider:   Berniece Salines,  DO   Other Instructions Cardiac CT Angiogram A cardiac CT angiogram is a procedure to look at the heart and the area around the heart. It may be done to help find the cause of chest pains or other symptoms of heart disease. During this procedure, a substance called contrast dye is injected into the blood vessels in the area to be checked. A large X-ray machine, called a CT scanner, then takes detailed pictures of the heart and the surrounding area. The procedure is also sometimes called a coronary CT angiogram, coronary artery scanning, or CTA. A cardiac CT angiogram allows the health care provider to see how well blood is flowing to and from the heart. The health care provider will be able to see if there are any problems, such as:  Blockage or narrowing of the coronary arteries in the heart.  Fluid around the heart.  Signs of weakness or disease in the muscles, valves, and tissues of the heart. Tell a health care provider about:  Any allergies you have. This is especially important if you have had a previous allergic reaction to contrast dye.  All medicines you are taking, including vitamins, herbs, eye drops, creams, and over-the-counter medicines.  Any blood disorders you have.  Any surgeries you have had.  Any medical conditions you have.  Whether you are pregnant or may be pregnant.  Any anxiety disorders, chronic pain, or other conditions you have that may increase your stress or prevent you from lying still. What are the risks? Generally, this is a safe procedure. However, problems may occur, including: 1. Bleeding. 2. Infection. 3. Allergic reactions to medicines or dyes. 4. Damage to other structures or organs. 5. Kidney damage from the contrast dye that is used. 6. Increased risk of cancer from radiation exposure. This risk is low. Talk with your health care provider about: ? The risks and benefits of testing. ? How you can receive the lowest dose of radiation. What  happens before the procedure? 1. Wear comfortable clothing and remove any jewelry, glasses, dentures, and hearing aids. 2. Follow instructions from your health care provider about eating and drinking. This may include: ? For 12 hours before the procedure -- avoid caffeine. This includes tea, coffee, soda, energy drinks, and diet pills. Drink plenty of water or other fluids that do not have caffeine in them. Being well hydrated can prevent complications. ? For 4-6 hours before the procedure -- stop eating and drinking. The contrast dye can cause nausea, but this is less likely if your stomach is empty. 3. Ask your health care provider about changing or stopping your regular medicines. This is especially important if you are taking diabetes medicines, blood thinners, or medicines to treat problems with erections (erectile dysfunction). What happens during the procedure?  1. Hair on your chest may need to be removed so that small sticky patches called electrodes can be placed on your chest. These will transmit information that helps to monitor your heart during the procedure. 2. An IV will be inserted into one of  your veins. 3. You might be given a medicine to control your heart rate during the procedure. This will help to ensure that good images are obtained. 4. You will be asked to lie on an exam table. This table will slide in and out of the CT machine during the procedure. 5. Contrast dye will be injected into the IV. You might feel warm, or you may get a metallic taste in your mouth. 6. You will be given a medicine called nitroglycerin. This will relax or dilate the arteries in your heart. 7. The table that you are lying on will move into the CT machine tunnel for the scan. 8. The person running the machine will give you instructions while the scans are being done. You may be asked to: ? Keep your arms above your head. ? Hold your breath. ? Stay very still, even if the table is moving. 9. When  the scanning is complete, you will be moved out of the machine. 10. The IV will be removed. The procedure may vary among health care providers and hospitals. What can I expect after the procedure? After your procedure, it is common to have:  A metallic taste in your mouth from the contrast dye.  A feeling of warmth.  A headache from the nitroglycerin. Follow these instructions at home:  Take over-the-counter and prescription medicines only as told by your health care provider.  If you are told, drink enough fluid to keep your urine pale yellow. This will help to flush the contrast dye out of your body.  Most people can return to their normal activities right after the procedure. Ask your health care provider what activities are safe for you.  It is up to you to get the results of your procedure. Ask your health care provider, or the department that is doing the procedure, when your results will be ready.  Keep all follow-up visits as told by your health care provider. This is important. Contact a health care provider if: 1. You have any symptoms of allergy to the contrast dye. These include: ? Shortness of breath. ? Rash or hives. ? A racing heartbeat. Summary  A cardiac CT angiogram is a procedure to look at the heart and the area around the heart. It may be done to help find the cause of chest pains or other symptoms of heart disease.  During this procedure, a large X-ray machine, called a CT scanner, takes detailed pictures of the heart and the surrounding area after a contrast dye has been injected into blood vessels in the area.  Ask your health care provider about changing or stopping your regular medicines before the procedure. This is especially important if you are taking diabetes medicines, blood thinners, or medicines to treat erectile dysfunction.  If you are told, drink enough fluid to keep your urine pale yellow. This will help to flush the contrast dye out of your  body. This information is not intended to replace advice given to you by your health care provider. Make sure you discuss any questions you have with your health care provider. Document Revised: 04/28/2019 Document Reviewed: 04/28/2019 Elsevier Patient Education  [place].

## 2020-12-07 ENCOUNTER — Other Ambulatory Visit: Payer: Self-pay | Admitting: Cardiology

## 2020-12-30 ENCOUNTER — Other Ambulatory Visit: Payer: Self-pay | Admitting: Cardiology

## 2021-01-01 NOTE — Telephone Encounter (Signed)
Losartan 50 mg # 180 tablets to pharmacy with 3 additional refills

## 2021-02-24 LAB — BASIC METABOLIC PANEL
BUN/Creatinine Ratio: 49 — ABNORMAL HIGH (ref 12–28)
BUN: 44 mg/dL — ABNORMAL HIGH (ref 8–27)
CO2: 21 mmol/L (ref 20–29)
Calcium: 9.4 mg/dL (ref 8.7–10.3)
Chloride: 105 mmol/L (ref 96–106)
Creatinine, Ser: 0.9 mg/dL (ref 0.57–1.00)
Glucose: 86 mg/dL (ref 65–99)
Potassium: 4.5 mmol/L (ref 3.5–5.2)
Sodium: 142 mmol/L (ref 134–144)
eGFR: 71 mL/min/{1.73_m2} (ref >59)

## 2021-03-08 ENCOUNTER — Telehealth (HOSPITAL_COMMUNITY): Payer: Self-pay | Admitting: *Deleted

## 2021-03-08 NOTE — Telephone Encounter (Signed)
Reaching out to patient to offer assistance regarding upcoming cardiac imaging study; pt verbalizes understanding of appt date/time, parking situation and where to check in, pre-test NPO status and medications ordered, and verified current allergies; name and call back number provided for further questions should they arise  Larey Brick RN Navigator Cardiac Imaging [facility] [phone] office [phone] cell  Pt to take her morning carvedilol 2 hours prior to cardiac CT.

## 2021-03-12 ENCOUNTER — Ambulatory Visit (HOSPITAL_COMMUNITY)
Admission: RE | Admit: 2021-03-12 | Discharge: 2021-03-12 | Disposition: A | Discharge status: Home / Self Care | Payer: Medicare PPO | Source: Ambulatory Visit | Attending: Cardiology | Admitting: Cardiology

## 2021-03-12 ENCOUNTER — Encounter (HOSPITAL_COMMUNITY): Payer: Self-pay

## 2021-03-12 ENCOUNTER — Other Ambulatory Visit: Payer: Self-pay

## 2021-03-12 DIAGNOSIS — R072 Precordial pain: Secondary | ICD-10-CM

## 2021-03-12 MED ORDER — NITROGLYCERIN 0.4 MG SL SUBL
0.8000 mg | SUBLINGUAL_TABLET | Freq: Once | SUBLINGUAL | Status: AC
  Administered 2021-03-12: 15:00:00 0.8 mg via SUBLINGUAL

## 2021-03-12 MED ORDER — NITROGLYCERIN 0.4 MG SL SUBL
SUBLINGUAL_TABLET | SUBLINGUAL | Status: AC
  Filled 2021-03-12: qty 2

## 2021-03-12 MED ORDER — IOHEXOL 350 MG/ML SOLN
95.0000 mL | Freq: Once | INTRAVENOUS | Status: AC | PRN
  Administered 2021-03-12: 16:00:00 95 mL via INTRAVENOUS

## 2021-03-26 ENCOUNTER — Ambulatory Visit (HOSPITAL_COMMUNITY): Payer: BC Managed Care – PPO

## 2021-04-02 ENCOUNTER — Encounter (HOSPITAL_BASED_OUTPATIENT_CLINIC_OR_DEPARTMENT_OTHER): Payer: Self-pay

## 2021-04-02 ENCOUNTER — Emergency Department (HOSPITAL_BASED_OUTPATIENT_CLINIC_OR_DEPARTMENT_OTHER)
Admission: EM | Admit: 2021-04-02 | Discharge: 2021-04-02 | Disposition: A | Discharge status: Home / Self Care | Payer: Medicare Other | Attending: Emergency Medicine | Admitting: Emergency Medicine

## 2021-04-02 ENCOUNTER — Other Ambulatory Visit: Payer: Self-pay

## 2021-04-02 ENCOUNTER — Emergency Department (HOSPITAL_BASED_OUTPATIENT_CLINIC_OR_DEPARTMENT_OTHER): Payer: Medicare Other

## 2021-04-02 DIAGNOSIS — I509 Heart failure, unspecified: Secondary | ICD-10-CM | POA: Insufficient documentation

## 2021-04-02 DIAGNOSIS — M79672 Pain in left foot: Secondary | ICD-10-CM | POA: Diagnosis not present

## 2021-04-02 DIAGNOSIS — Z7982 Long term (current) use of aspirin: Secondary | ICD-10-CM | POA: Diagnosis not present

## 2021-04-02 DIAGNOSIS — X58XXXA Exposure to other specified factors, initial encounter: Secondary | ICD-10-CM | POA: Diagnosis not present

## 2021-04-02 DIAGNOSIS — T1490XA Injury, unspecified, initial encounter: Secondary | ICD-10-CM

## 2021-04-02 DIAGNOSIS — L03116 Cellulitis of left lower limb: Secondary | ICD-10-CM | POA: Diagnosis not present

## 2021-04-02 DIAGNOSIS — Z8616 Personal history of COVID-19: Secondary | ICD-10-CM | POA: Diagnosis not present

## 2021-04-02 DIAGNOSIS — I10 Essential (primary) hypertension: Secondary | ICD-10-CM | POA: Diagnosis not present

## 2021-04-02 DIAGNOSIS — S99922A Unspecified injury of left foot, initial encounter: Secondary | ICD-10-CM | POA: Insufficient documentation

## 2021-04-02 DIAGNOSIS — R030 Elevated blood-pressure reading, without diagnosis of hypertension: Secondary | ICD-10-CM

## 2021-04-02 DIAGNOSIS — Z79899 Other long term (current) drug therapy: Secondary | ICD-10-CM | POA: Insufficient documentation

## 2021-04-02 DIAGNOSIS — M7732 Calcaneal spur, left foot: Secondary | ICD-10-CM | POA: Diagnosis not present

## 2021-04-02 DIAGNOSIS — M7989 Other specified soft tissue disorders: Secondary | ICD-10-CM | POA: Diagnosis not present

## 2021-04-02 DIAGNOSIS — I110 Hypertensive heart disease with heart failure: Secondary | ICD-10-CM | POA: Insufficient documentation

## 2021-04-02 MED ORDER — CEPHALEXIN 250 MG PO CAPS
1000.0000 mg | ORAL_CAPSULE | Freq: Once | ORAL | Status: AC
  Administered 2021-04-02: 1000 mg via ORAL
  Filled 2021-04-02: qty 4

## 2021-04-02 MED ORDER — CEPHALEXIN 500 MG PO CAPS
500.0000 mg | ORAL_CAPSULE | Freq: Four times a day (QID) | ORAL | 0 refills | Status: DC
  Filled 2021-04-02: qty 28, 7d supply, fill #0

## 2021-04-02 MED ORDER — ACETAMINOPHEN 500 MG PO TABS
1000.0000 mg | ORAL_TABLET | Freq: Once | ORAL | Status: AC
  Administered 2021-04-02: 1000 mg via ORAL
  Filled 2021-04-02: qty 2

## 2021-04-02 NOTE — ED Notes (Signed)
Pt at [place], will administer meds upon return to room

## 2021-04-02 NOTE — ED Provider Notes (Signed)
[place] EMERGENCY DEPARTMENT Provider Note   CSN: [id number] Arrival date & time: 04/02/21  1704     History Chief Complaint  Patient presents with   Foot Pain    Latoya Rios is a 65 y.o. female.   Foot Pain      Past Medical History:  Diagnosis Date   Abnormal renal function 03/01/2019   Formatting of this note is different from the original. eGFR  >=60.0 mL/min/1.36m*2 >60.0  31.2Low  CM  >60.0 CM     Last Assessment & Plan:  Formatting of this note might be different from the original. Resolved as of last labs Rechecking labs to ensure stability   Accelerated hypertension 06/08/2020   Acute cystitis without hematuria 06/24/2020   Acute hypoxemic respiratory failure due to COVID-19 [facility]) 06/08/2020   Acute kidney injury ([place]) 06/23/2020   AKI (acute kidney injury) ([place]) 06/24/2020   ARF (acute renal failure) ([place]) 06/24/2020   Benign essential hypertension 05/29/2016   Last Assessment & Plan:  Formatting of this note is different from the original. Pertinent Data:   Current medication includes: carvediloL - 3.125 mg losartan-hydroCHLOROthiazide - 100-25 mg .  BP Readings from Last 3 Encounters:  02/29/20 (!) 206/86  08/31/19 (!) 142/78  08/16/19 (!) 160/56   LDL Calculated (mg/dL)  Date Value  08/31/2019 144 (H)  03/01/2019 156 (H)   Creatinine (mg/dL)  Date Val   CHF (congestive heart failure) (HCC)    Class 2 severe obesity due to excess calories with serious comorbidity and body mass index (BMI) of 37.0 to 37.9 in adult [facility]) 05/29/2016   Formatting of this note is different from the original. Wt Readings from Last 3 Encounters:  08/31/19 233 lb  08/16/19 239 lb  03/01/19 249 lb    Last Assessment & Plan:  Formatting of this note might be different from the original. BMI Assessment: Current Body mass index is 37.22 kg/m.  Patient BMI currently is above average (>25 kg/m2); BMI follow up plan is completed . Current barriers to heal   Demand ischemia ([place]) 06/08/2020    Drug declined by patient 02/29/2020   Last Assessment & Plan:  Formatting of this note might be different from the original. Patient historically has declined statins and at some point was on losartan/HCTZ and carvedilol, but stopped taking them stating " I dont want to take medications ".  Today I again discussed with patient the risks of uncontrolled HYPERLIPIDEMIA and HYPERTENSION including but not limited to strokes, MIs, renal in   Encounter for preventative adult health care exam with abnormal findings 06/12/2017   Last Assessment & Plan:  Formatting of this note might be different from the original. Patient for wellness visit.  Overall doing good. No new concerns. Reviewed appropriate age screenings and vaccinations.  Influenza vaccine: Patient Declined Tdap/TD: Patient Declined Zoster vaccine: Patient Declined  Colonoscopy: UTD, due 2022 Mammogram: UTD  Depression screening: negative No falls reported  Dis   Gynecologic exam normal 08/16/2019   Last Assessment & Plan:  Formatting of this note might be different from the original. Pap smear with high risk hpv collected for screening Discussed and encouraged healthy lifestyle.  Always use sunscreen when sun exposed.  Encouraged healthy diet with fresh fruits, vegetables, and lean meat.  Encouraged regular exercise for heart health and bone health.  Encouraged continue monthly SBE and yearl   Heart murmur    Hypertension    Hypotension due to drugs 06/22/2020   Mixed hyperlipidemia  Obese    Obesity (BMI 30-39.9) 06/08/2020   Pneumonia due to COVID-19 virus 06/08/2020    Patient Active Problem List   Diagnosis Date Noted   Acute cystitis without hematuria 06/24/2020   ARF (acute renal failure) ([place]) 06/24/2020   AKI (acute kidney injury) ([place]) 06/24/2020   Acute kidney injury ([place]) 06/23/2020   Hypotension due to drugs 06/22/2020   CHF (congestive heart failure) (HCC)    Heart murmur    Hypertension    Obese    Acute hypoxemic respiratory  failure due to COVID-19 ([place]) 06/08/2020   Accelerated hypertension 06/08/2020   Obesity (BMI 30-39.9) 06/08/2020   Demand ischemia ([place]) 06/08/2020   Drug declined by patient 02/29/2020   Gynecologic exam normal 08/16/2019   Abnormal renal function 03/01/2019   Encounter for preventative adult health care exam with abnormal findings 06/12/2017   Mixed hyperlipidemia 05/29/2016   Class 2 severe obesity due to excess calories with serious comorbidity and body mass index (BMI) of 37.0 to 37.9 in adult [facility]) 05/29/2016    Past Surgical History:  Procedure Laterality Date   COLONOSCOPY     DENTAL SURGERY       OB History     Gravida  1   Para  1   Term      Preterm  1   AB      Living  1      SAB      IAB      Ectopic      Multiple      Live Births  1           Family History  Problem Relation Age of Onset   Emphysema Mother    Lung disease Mother    Brain cancer Father     Social History   Tobacco Use   Smoking status: Never   Smokeless tobacco: Never  Substance Use Topics   Alcohol use: Never   Drug use: Never    Home Medications Prior to Admission medications   Medication Sig Start Date End Date Taking? Authorizing Provider  aspirin EC 81 MG EC tablet Take 1 tablet (81 mg total) by mouth daily. Swallow whole. 06/13/20   Lavina Hamman, MD  carvedilol (COREG) 6.25 MG tablet TAKE 1 TABLET(6.25 MG) BY MOUTH TWICE DAILY WITH A MEAL 12/07/20   Tobb, Kardie, DO  Cholecalciferol (VITAMIN D3) 10 MCG (400 UNIT) CAPS Take by mouth in the morning and at bedtime.    [provider]  losartan (COZAAR) 50 MG tablet TAKE 2 TABLETS BY MOUTH EVERY DAY 01/01/21   Tobb, Kardie, DO  nitroGLYCERIN (NITROSTAT) 0.4 MG SL tablet Place 1 tablet (0.4 mg total) under the tongue every 5 (five) minutes as needed. 12/04/20 03/04/21  Tobb, Kardie, DO  triamterene-hydrochlorothiazide (MAXZIDE-25) 37.5-25 MG tablet Take 1 tablet by mouth daily. Dc chlorthalidone 08/14/20    Tobb, Kardie, DO    Allergies    Clonidine, Nisoldipine, Penicillins, Quinacrine, and Quinapril  Review of Systems   Review of Systems  Physical Exam Updated Vital Signs BP (!) 189/63 (BP Location: Left Arm)   Pulse (!) 59   Temp 98.5 F (36.9 C) (Oral)   Resp 20   Ht 1.549 m ($Remove'5\' 1"'[id number]$ )   Wt 90.7 kg   SpO2 100%   BMI 37.79 kg/m   Physical Exam  ED Results / Procedures / Treatments   Labs (all labs ordered are listed, but only abnormal results are displayed) Labs Reviewed -  No data to display  EKG None  Radiology US Venous Img Lower  Left (DVT Study)  Result Date: 04/02/2021 CLINICAL DATA:  Left lower extremity swelling EXAM: LEFT LOWER EXTREMITY VENOUS DOPPLER ULTRASOUND TECHNIQUE: Gray-scale sonography with compression, as well as color and duplex ultrasound, were performed to evaluate the deep venous system(s) from the level of the common femoral vein through the popliteal and proximal calf veins. COMPARISON:  None. FINDINGS: VENOUS Normal compressibility of the common femoral, superficial femoral, and popliteal veins, as well as the visualized calf veins. Visualized portions of profunda femoral vein and great saphenous vein unremarkable. No filling defects to suggest DVT on grayscale or color Doppler imaging. Doppler waveforms show normal direction of venous flow, normal respiratory plasticity and response to augmentation. Limited views of the contralateral common femoral vein are unremarkable. OTHER None. Limitations: none IMPRESSION: 1. No evidence of deep venous thrombosis within the left lower extremity. Electronically Signed   By: Randa Ngo M.D.   On: 04/02/2021 19:21   DG Foot Complete Left  Result Date: 04/02/2021 CLINICAL DATA:  Left foot pain since 03/28/2021. The patient may have suffered an injury. EXAM: LEFT FOOT - COMPLETE 3+ VIEW COMPARISON:  None. FINDINGS: There is no evidence of fracture or dislocation. There is no evidence of arthropathy or other focal  bone abnormality. Plantar calcaneal spur and small accessory ossicle off the navicular are noted. Soft tissues are unremarkable. IMPRESSION: Plantar calcaneal spur.  Otherwise negative. Electronically Signed   By: Inge Rise M.D.   On: 04/02/2021 17:58    Procedures Procedures   Medications Ordered in ED Medications  cephALEXin (KEFLEX) capsule 1,000 mg (has no administration in time range)  acetaminophen (TYLENOL) tablet 1,000 mg (has no administration in time range)    ED Course  I have reviewed the triage vital signs and the nursing notes.  Pertinent labs & imaging results that were available during my care of the patient were reviewed by me and considered in my medical decision making (see chart for details).    MDM Rules/Calculators/A&P                         Xrays.   Reviewed nursing notes and prior charts for additional history.   Xrays reviewed/interpreted by me - no fx.   U/s reviewed/interpreted by me - no dvt.   Keflex po.  Will tx for suspected  cellulitis.   Return precautions provided.    Final Clinical Impression(s) / ED Diagnoses Final diagnoses:  Injury    Rx / DC Orders ED Discharge Orders     None        Lajean Saver, MD 04/02/21 2024

## 2021-04-02 NOTE — Discharge Instructions (Addendum)
It was our pleasure to provide your ER care today - we hope that you feel better.  Elevate foot as much as possible to help with the swelling.   Take keflex as prescribed. Take acetaminophen or ibuprofen as need.   Follow up with primary care doctor in the coming week if symptoms fail to improve/resolve.  Also follow up with your doctor for recheck of blood pressure which is high tonight.   Return to ER if worse, new symptoms, high fevers, spreading redness, severe pain, or other concern.

## 2021-04-02 NOTE — ED Notes (Signed)
No pad available to retrieve signature, pt verbalized understanding of all instructions

## 2021-04-02 NOTE — ED Provider Notes (Signed)
[place] EMERGENCY DEPARTMENT Provider Note   CSN: [id number] Arrival date & time: 04/02/21  1704     History Chief Complaint  Patient presents with   Foot Pain    Latoya Rios is a 65 y.o. female.  Patient c/o left foot pain for past 4-5 days. States symptoms acute onset, moderate, constant, persistent. Denies specific injury or trauma - states possibly could have hurt while doing water exercises the day before, but states the pain/swelling didn't start until approximately one day later. Denies hx similar symptoms. No hx gout. No other extremity or joint pain. No fever or chills. No calf pain. No hx dvt or pe. Denies foot numbness/weakness. Has noted mild redness to dorsum and lateral aspect foot. Denies fever or chills.   The history is provided by the patient.  Foot Pain Pertinent negatives include no chest pain, no abdominal pain and no shortness of breath.      Past Medical History:  Diagnosis Date   Abnormal renal function 03/01/2019   Formatting of this note is different from the original. eGFR  >=60.0 mL/min/1.32m2 >60.0  31.2Low  CM  >60.0 CM     Last Assessment & Plan:  Formatting of this note might be different from the original. Resolved as of last labs Rechecking labs to ensure stability   Accelerated hypertension 06/08/2020   Acute cystitis without hematuria 06/24/2020   Acute hypoxemic respiratory failure due to COVID-19 ([facility] 06/08/2020   [place] 06/23/2020   AK[place] 06/24/[place]   ARF (acute renal failure) ([place] 06/24/2020   Benign essential hypertension 05/29/2016   Last Assessment & Plan:  Formatting of this note is different from the original. Pertinent Data:   Current medication includes: carvediloL - 3.125 mg losartan-hydroCHLOROthiazide - 100-25 mg .  BP Readings from Last 3 Encounters:  02/29/20 (!) 206/86  08/31/19 (!) 142/78  08/16/19 (!) 160/56   LDL Calculated (mg/dL)  Date Value  08/31/2019 144 (H)   03/01/2019 156 (H)   Creatinine (mg/dL)  Date Val   CHF (congestive heart failure) (HCC)    Class 2 severe obesity due to excess calories with serious comorbidity and body mass index (BMI) of 37.0 to 37.9 in adult ([facility] 05/29/2016   Formatting of this note is different from the original. Wt Readings from Last 3 Encounters:  08/31/19 233 lb  08/16/19 239 lb  03/01/19 249 lb    Last Assessment & Plan:  Formatting of this note might be different from the original. BMI Assessment: Current Body mass index is 37.22 kg/m.  Patient BMI currently is above average (>25 kg/m2); BMI follow up plan is completed . Current barriers to heal   Demand ischemia ([place] 06/08/2020   Drug declined by patient 02/29/2020   Last Assessment & Plan:  Formatting of this note might be different from the original. Patient historically has declined statins and at some point was on losartan/HCTZ and carvedilol, but stopped taking them stating " I dont want to take medications ".  Today I again discussed with patient the risks of uncontrolled HYPERLIPIDEMIA and HYPERTENSION including but not limited to strokes, MIs, renal in   Encounter for preventative adult health care exam with abnormal findings 06/12/2017   Last Assessment & Plan:  Formatting of this note might be different from the original. Patient for wellness visit.  Overall doing good. No new concerns. Reviewed appropriate age screenings and vaccinations.  Influenza vaccine: Patient Declined Tdap/TD: Patient Declined Zoster vaccine:  Patient Declined  Colonoscopy: UTD, due 2022 Mammogram: UTD  Depression screening: negative No falls reported  Dis   Gynecologic exam normal 08/16/2019   Last Assessment & Plan:  Formatting of this note might be different from the original. Pap smear with high risk hpv collected for screening Discussed and encouraged healthy lifestyle.  Always use sunscreen when sun exposed.  Encouraged healthy diet with fresh fruits, vegetables, and lean meat.   Encouraged regular exercise for heart health and bone health.  Encouraged continue monthly SBE and yearl   Heart murmur    Hypertension    Hypotension due to drugs 06/22/2020   Mixed hyperlipidemia    Obese    Obesity (BMI 30-39.9) 06/08/2020   Pneumonia due to COVID-19 virus 06/08/2020    Patient Active Problem List   Diagnosis Date Noted   Acute cystitis without hematuria 06/24/2020   ARF (acute renal failure) ([place]) 06/24/2020   AKI (acute kidney injury) ([place]) 06/24/2020   Acute kidney injury ([place]) 06/23/2020   Hypotension due to drugs 06/22/2020   CHF (congestive heart failure) (HCC)    Heart murmur    Hypertension    Obese    Acute hypoxemic respiratory failure due to COVID-19 ([place]) 06/08/2020   Accelerated hypertension 06/08/2020   Obesity (BMI 30-39.9) 06/08/2020   Demand ischemia ([place]) 06/08/2020   Drug declined by patient 02/29/2020   Gynecologic exam normal 08/16/2019   Abnormal renal function 03/01/2019   Encounter for preventative adult health care exam with abnormal findings 06/12/2017   Mixed hyperlipidemia 05/29/2016   Class 2 severe obesity due to excess calories with serious comorbidity and body mass index (BMI) of 37.0 to 37.9 in adult [facility]) 05/29/2016    Past Surgical History:  Procedure Laterality Date   COLONOSCOPY     DENTAL SURGERY       OB History     Gravida  1   Para  1   Term      Preterm  1   AB      Living  1      SAB      IAB      Ectopic      Multiple      Live Births  1           Family History  Problem Relation Age of Onset   Emphysema Mother    Lung disease Mother    Brain cancer Father     Social History   Tobacco Use   Smoking status: Never   Smokeless tobacco: Never  Substance Use Topics   Alcohol use: Never   Drug use: Never    Home Medications Prior to Admission medications   Medication Sig Start Date End Date Taking? Authorizing Provider  aspirin EC 81 MG EC tablet Take 1 tablet (81 mg total)  by mouth daily. Swallow whole. 06/13/20   Lavina Hamman, MD  carvedilol (COREG) 6.25 MG tablet TAKE 1 TABLET(6.25 MG) BY MOUTH TWICE DAILY WITH A MEAL 12/07/20   Tobb, Kardie, DO  Cholecalciferol (VITAMIN D3) 10 MCG (400 UNIT) CAPS Take by mouth in the morning and at bedtime.    [provider]  losartan (COZAAR) 50 MG tablet TAKE 2 TABLETS BY MOUTH EVERY DAY 01/01/21   Tobb, Kardie, DO  nitroGLYCERIN (NITROSTAT) 0.4 MG SL tablet Place 1 tablet (0.4 mg total) under the tongue every 5 (five) minutes as needed. 12/04/20 03/04/21  Tobb, Kardie, DO  triamterene-hydrochlorothiazide (MAXZIDE-25) 37.5-25 MG tablet Take  1 tablet by mouth daily. Dc chlorthalidone 08/14/20   Tobb, Kardie, DO    Allergies    Clonidine, Nisoldipine, Penicillins, Quinacrine, and Quinapril  Review of Systems   Review of Systems  Constitutional:  Negative for chills and fever.  HENT:  Negative for sore throat.   Eyes:  Negative for redness.  Respiratory:  Negative for shortness of breath.   Cardiovascular:  Negative for chest pain.  Gastrointestinal:  Negative for abdominal pain.  Genitourinary:  Negative for flank pain.  Musculoskeletal:  Negative for arthralgias.  Skin:  Negative for rash.  Neurological:  Negative for weakness and numbness.  Hematological:  Does not bruise/bleed easily.  Psychiatric/Behavioral:  Negative for confusion.    Physical Exam Updated Vital Signs BP (!) 189/63 (BP Location: Left Arm)   Pulse (!) 59   Temp 98.5 F (36.9 C) (Oral)   Resp 20   Ht 1.549 m (5' 1")   Wt 90.7 kg   SpO2 100%   BMI 37.79 kg/m   Physical Exam Vitals and nursing note reviewed.  Constitutional:      Appearance: Normal appearance. She is well-developed.  HENT:     Head: Atraumatic.     Nose: Nose normal.     Mouth/Throat:     Mouth: Mucous membranes are moist.  Eyes:     General: No scleral icterus.    Conjunctiva/sclera: Conjunctivae normal.  Neck:     Trachea: No tracheal deviation.   Cardiovascular:     Rate and Rhythm: Normal rate.     Pulses: Normal pulses.  Pulmonary:     Effort: Pulmonary effort is normal. No respiratory distress.  Abdominal:     General: There is no distension.  Genitourinary:    Comments: No cva tenderness.  Musculoskeletal:     Cervical back: Neck supple. No muscular tenderness.     Comments: Pt with diffuse swelling to left foot. Dorsum of foot is erythematous and warm compared to right. Distal pulses are palp, and normal cap refill distally in toes. There are a few areas of superficial cracks in skin, dry/scaly skin. There are no open sores or ulcers. Ankle is grossly stable. There is no focal bony tenderness. No calf swelling, pain or tenderness.   Skin:    General: Skin is warm and dry.     Findings: No rash.  Neurological:     Mental Status: She is alert.     Comments: Alert, speech normal. Left foot nvi.   Psychiatric:        Mood and Affect: Mood normal.    ED Results / Procedures / Treatments   Labs (all labs ordered are listed, but only abnormal results are displayed) Labs Reviewed - No data to display  EKG None  Radiology US Venous Img Lower  Left (DVT Study)  Result Date: 04/02/2021 CLINICAL DATA:  Left lower extremity swelling EXAM: LEFT LOWER EXTREMITY VENOUS DOPPLER ULTRASOUND TECHNIQUE: Gray-scale sonography with compression, as well as color and duplex ultrasound, were performed to evaluate the deep venous system(s) from the level of the common femoral vein through the popliteal and proximal calf veins. COMPARISON:  None. FINDINGS: VENOUS Normal compressibility of the common femoral, superficial femoral, and popliteal veins, as well as the visualized calf veins. Visualized portions of profunda femoral vein and great saphenous vein unremarkable. No filling defects to suggest DVT on grayscale or color Doppler imaging. Doppler waveforms show normal direction of venous flow, normal respiratory plasticity and response to  augmentation. Limited views  of the contralateral common femoral vein are unremarkable. OTHER None. Limitations: none IMPRESSION: 1. No evidence of deep venous thrombosis within the left lower extremity. Electronically Signed   By: Randa Ngo M.D.   On: 04/02/2021 19:21   DG Foot Complete Left  Result Date: 04/02/2021 CLINICAL DATA:  Left foot pain since 03/28/2021. The patient may have suffered an injury. EXAM: LEFT FOOT - COMPLETE 3+ VIEW COMPARISON:  None. FINDINGS: There is no evidence of fracture or dislocation. There is no evidence of arthropathy or other focal bone abnormality. Plantar calcaneal spur and small accessory ossicle off the navicular are noted. Soft tissues are unremarkable. IMPRESSION: Plantar calcaneal spur.  Otherwise negative. Electronically Signed   By: Inge Rise M.D.   On: 04/02/2021 17:58    Procedures Procedures   Medications Ordered in ED Medications - No data to display  ED Course  I have reviewed the triage vital signs and the nursing notes.  Pertinent labs & imaging results that were available during my care of the patient were reviewed by me and considered in my medical decision making (see chart for details).    MDM Rules/Calculators/A&P                         Xrays.  Reviewed nursing notes and prior charts for additional history.   Xrays reviewed/interpreted by me - no fx.   Exam is felt most c/w cellulitis. Keflex 1 gm po.   Po fuids.   Vascular ultrasound reviewed/interpreted by me - no dvt.   Pt currently appears stable for d/c.  Return precautions provided.      Final Clinical Impression(s) / ED Diagnoses Final diagnoses:  Injury    Rx / DC Orders ED Discharge Orders     None        Lajean Saver, MD 04/02/21 2043

## 2021-04-02 NOTE — ED Triage Notes (Signed)
Pt c/o pain to left foot started 7/13 with ?injury 7/12-NAD-to triage in w/c

## 2021-04-03 ENCOUNTER — Other Ambulatory Visit (HOSPITAL_BASED_OUTPATIENT_CLINIC_OR_DEPARTMENT_OTHER): Payer: Self-pay

## 2021-04-23 ENCOUNTER — Telehealth: Payer: Self-pay | Admitting: Cardiology

## 2021-04-23 MED ORDER — TRIAMTERENE-HCTZ 37.5-25 MG PO TABS: 1.0000 | ORAL_TABLET | Freq: Every day | ORAL | 1 refills | Status: DC

## 2021-04-23 MED ORDER — NITROGLYCERIN 0.4 MG SL SUBL: 0.4000 mg | SUBLINGUAL_TABLET | SUBLINGUAL | 1 refills | Status: DC | PRN

## 2021-04-23 MED ORDER — LOSARTAN POTASSIUM 50 MG PO TABS: ORAL_TABLET | ORAL | 1 refills | Status: DC

## 2021-04-23 MED ORDER — CARVEDILOL 6.25 MG PO TABS: 6.2500 mg | ORAL_TABLET | Freq: Two times a day (BID) | ORAL | 1 refills | Status: DC

## 2021-04-23 NOTE — Telephone Encounter (Signed)
*  STAT* If patient is at the pharmacy, call can be transferred to refill team.   1. Which medications need to be refilled? (please list name of each medication and dose if known) carvedilol (COREG) 6.25 MG tablet; losartan (COZAAR) 50 MG tablet; nitroGLYCERIN (NITROSTAT) 0.4 MG SL tablet (Expired); triamterene-hydrochlorothiazide (MAXZIDE-25) 37.5-25 MG tablet  2. Which pharmacy/location (including street and city if local pharmacy) is medication to be sent to?  [id number] - [place], [place] - [address]  3. Do they need a 30 day or 90 day supply? 90

## 2021-06-18 ENCOUNTER — Ambulatory Visit (INDEPENDENT_AMBULATORY_CARE_PROVIDER_SITE_OTHER): Payer: Medicare Other | Admitting: Cardiology

## 2021-06-18 ENCOUNTER — Other Ambulatory Visit: Payer: Self-pay

## 2021-06-18 VITALS — BP 152/74 | HR 56 | Ht 61.0 in | Wt 212.0 lb

## 2021-06-18 DIAGNOSIS — E782 Mixed hyperlipidemia: Secondary | ICD-10-CM | POA: Diagnosis not present

## 2021-06-18 DIAGNOSIS — I10 Essential (primary) hypertension: Secondary | ICD-10-CM | POA: Diagnosis not present

## 2021-06-18 NOTE — Patient Instructions (Addendum)
Medication Instructions:  Your physician recommends that you continue on your current medications as directed. Please refer to the Current Medication list given to you today. Please take your blood pressure daily for 1 week and send in a MyChart message.  *If you need a refill on your cardiac medications before your next appointment, please call your pharmacy*   Lab Work: None If you have labs (blood work) drawn today and your tests are completely normal, you will receive your results only by: MyChart Message (if you have MyChart) OR A paper copy in the mail If you have any lab test that is abnormal or we need to change your treatment, we will call you to review the results.   Testing/Procedures: None   Follow-Up: At [facility], you and your health needs are our priority.  As part of our continuing mission to provide you with exceptional heart care, we have created designated Provider Care Teams.  These Care Teams include your primary Cardiologist (physician) and Advanced Practice Providers (APPs -  Physician Assistants and Nurse Practitioners) who all work together to provide you with the care you need, when you need it.  We recommend signing up for the patient portal called "MyChart".  Sign up information is provided on this After Visit Summary.  MyChart is used to connect with patients for Virtual Visits (Telemedicine).  Patients are able to view lab/test results, encounter notes, upcoming appointments, etc.  Non-urgent messages can be sent to your provider as well.   To learn more about what you can do with MyChart, go to [email or web address].    Your next appointment:   9 month(s)  The format for your next appointment:   In Person  Provider:   Thomasene Ripple, DO [address], [place], [place] [address]    Other Instructions

## 2021-06-18 NOTE — Progress Notes (Signed)
Cardiology Office Note:    Date:  06/18/2021   ID:  Latoya Rios, DOB 1955/11/10, MRN [id number]  PCP:  Mackie Pai, PA-C  Cardiologist:  Berniece Salines, DO  Electrophysiologist:  None   Referring MD: Elise Benne   Chief Complaint  Patient presents with   Follow-up    6 months.    History of Present Illness:    Latoya Rios is a 65 y.o. female with a hx of hypertension here today for follow-up visit.I saw the patient in October 2021 at that time she was hypotensive therefore I decreased her hydralazine and stopped the Aldactone.    Her visit on July 21, 2020 the patient reported that she had been experiencing some dizziness and was concerned about this.  I placed a monitor on the patient and also had blood pressure cuff mailed to the patient and encouraged her to take her blood pressure sitting and standing.   I last saw the patient on August 14, 2020 at that time her blood pressure was elevated I increased her carvedilol to 6.25 mg twice a day.  We talked about her monitor results which show occasional PACs, PVCs as well as paroxysmal atrial tachycardia.  She also had questions about exercise as and we discussed this.   I last saw the patient via video visit on September 06, 2020 at that time she appeared to be doing well.  No changes were made in her medications.  I saw the patient December 05, 2019.  She reported continued chest discomfort.  We will schedule coronary CTA.-Patient was able to get a coronary CTA which did not show any evidence of coronary artery disease.  Since has been that she has had some issues with her health, she was treated for pneumonia in June recently like cellulitis.  Denies any chest pain shortness of breath  Past Medical History:  Diagnosis Date   Abnormal renal function 03/01/2019   Formatting of this note is different from the original. eGFR  >=60.0 mL/min/1.72m2 >60.0  31.2Low  CM  >60.0 CM     Last Assessment & Plan:  Formatting of this  note might be different from the original. Resolved as of last labs Rechecking labs to ensure stability   Accelerated hypertension 06/08/2020   Acute cystitis without hematuria 06/24/2020   Acute hypoxemic respiratory failure due to COVID-19 ([facility] 06/08/2020   Acute kidney injury ([facility] 06/23/2020   AKI (acute kidney injury) ([facility] 06/24/2020   ARF (acute renal failure) ([facility] 06/24/2020   Benign essential hypertension 05/29/2016   Last Assessment & Plan:  Formatting of this note is different from the original. Pertinent Data:   Current medication includes: carvediloL - 3.125 mg losartan-hydroCHLOROthiazide - 100-25 mg .  BP Readings from Last 3 Encounters:  02/29/20 (!) 206/86  08/31/19 (!) 142/78  08/16/19 (!) 160/56   LDL Calculated (mg/dL)  Date Value  08/31/2019 144 (H)  03/01/2019 156 (H)   Creatinine (mg/dL)  Date Val   CHF (congestive heart failure) (HCC)    Class 2 severe obesity due to excess calories with serious comorbidity and body mass index (BMI) of 37.0 to 37.9 in adult ([facility] 05/29/2016   Formatting of this note is different from the original. Wt Readings from Last 3 Encounters:  08/31/19 233 lb  08/16/19 239 lb  03/01/19 249 lb    Last Assessment & Plan:  Formatting of this note might be different from the original. BMI Assessment: Current Body mass index is 37.22 kg/m.  Patient BMI currently is above average (>25 kg/m2); BMI follow up plan is completed . Current barriers to heal   Demand ischemia ([place]) 06/08/2020   Drug declined by patient 02/29/2020   Last Assessment & Plan:  Formatting of this note might be different from the original. Patient historically has declined statins and at some point was on losartan/HCTZ and carvedilol, but stopped taking them stating " I dont want to take medications ".  Today I again discussed with patient the risks of uncontrolled HYPERLIPIDEMIA and HYPERTENSION including but not limited to strokes, MIs, renal in   Encounter for preventative adult health care  exam with abnormal findings 06/12/2017   Last Assessment & Plan:  Formatting of this note might be different from the original. Patient for wellness visit.  Overall doing good. No new concerns. Reviewed appropriate age screenings and vaccinations.  Influenza vaccine: Patient Declined Tdap/TD: Patient Declined Zoster vaccine: Patient Declined  Colonoscopy: UTD, due 2022 Mammogram: UTD  Depression screening: negative No falls reported  Dis   Gynecologic exam normal 08/16/2019   Last Assessment & Plan:  Formatting of this note might be different from the original. Pap smear with high risk hpv collected for screening Discussed and encouraged healthy lifestyle.  Always use sunscreen when sun exposed.  Encouraged healthy diet with fresh fruits, vegetables, and lean meat.  Encouraged regular exercise for heart health and bone health.  Encouraged continue monthly SBE and yearl   Heart murmur    Hypertension    Hypotension due to drugs 06/22/2020   Mixed hyperlipidemia    Obese    Obesity (BMI 30-39.9) 06/08/2020   Pneumonia due to COVID-19 virus 06/08/2020    Past Surgical History:  Procedure Laterality Date   COLONOSCOPY     DENTAL SURGERY      Current Medications: Current Meds  Medication Sig   aspirin EC 81 MG EC tablet Take 1 tablet (81 mg total) by mouth daily. Swallow whole.   carvedilol (COREG) 6.25 MG tablet Take 1 tablet (6.25 mg total) by mouth 2 (two) times daily with a meal.   cephALEXin (KEFLEX) 500 MG capsule Take 1 capsule (500 mg total) by mouth 4 (four) times daily.   Cholecalciferol (VITAMIN D3) 10 MCG (400 UNIT) CAPS Take by mouth in the morning and at bedtime.   losartan (COZAAR) 50 MG tablet TAKE 2 TABLETS BY MOUTH EVERY DAY   nitroGLYCERIN (NITROSTAT) 0.4 MG SL tablet Place 1 tablet (0.4 mg total) under the tongue every 5 (five) minutes as needed.   triamterene-hydrochlorothiazide (MAXZIDE-25) 37.5-25 MG tablet Take 1 tablet by mouth daily. Dc chlorthalidone     Allergies:    Clonidine, Nisoldipine, Penicillins, Quinacrine, and Quinapril   Social History   Socioeconomic History   Marital status: Widowed    Spouse name: Not on file   Number of children: Not on file   Years of education: Not on file   Highest education level: Not on file  Occupational History   Not on file  Tobacco Use   Smoking status: Never   Smokeless tobacco: Never  Substance and Sexual Activity   Alcohol use: Never   Drug use: Never   Sexual activity: Not Currently  Other Topics Concern   Not on file  Social History Narrative   Not on file   Social Determinants of Health   Financial Resource Strain: Not on file  Food Insecurity: Not on file  Transportation Needs: Not on file  Physical Activity: Not on file  Stress: Not on file  Social Connections: Not on file     Family History: The patient's family history includes Brain cancer in her father; Emphysema in her mother; Lung disease in her mother.  ROS:   Review of Systems  Constitution: Negative for decreased appetite, fever and weight gain.  HENT: Negative for congestion, ear discharge, hoarse voice and sore throat.   Eyes: Negative for discharge, redness, vision loss in right eye and visual halos.  Cardiovascular: Negative for chest pain, dyspnea on exertion, leg swelling, orthopnea and palpitations.  Respiratory: Negative for cough, hemoptysis, shortness of breath and snoring.   Endocrine: Negative for heat intolerance and polyphagia.  Hematologic/Lymphatic: Negative for bleeding problem. Does not bruise/bleed easily.  Skin: Negative for flushing, nail changes, rash and suspicious lesions.  Musculoskeletal: Negative for arthritis, joint pain, muscle cramps, myalgias, neck pain and stiffness.  Gastrointestinal: Negative for abdominal pain, bowel incontinence, diarrhea and excessive appetite.  Genitourinary: Negative for decreased libido, genital sores and incomplete emptying.  Neurological: Negative for brief  paralysis, focal weakness, headaches and loss of balance.  Psychiatric/Behavioral: Negative for altered mental status, depression and suicidal ideas.  Allergic/Immunologic: Negative for HIV exposure and persistent infections.    EKGs/Labs/Other Studies Reviewed:    The following studies were reviewed today:   EKG: None today  CCTA 03/12/2021 Aorta:  Normal size.  No calcifications.  No dissection.   Aortic Valve:  Trileaflet.  No calcifications.   Coronary Arteries:  Normal coronary origin.  Right dominance.   RCA is a large dominant artery that gives rise to PDA and PLA. There is no plaque.   Left main is a large artery that gives rise to LAD, Ramus intermedius and LCX arteries.   LAD is a large vessel that has no plaque.   Ramus with no plaques.   LCX is a non-dominant artery that gives rise to one large OM1 branch. There is no plaque.   Other findings:   Normal variant pulmonary vein (including right middle) all draining into the left atrium.   Normal left atrial appendage without a thrombus.   Normal size of the pulmonary artery.   IMPRESSION: 1. Coronary calcium score of 0. This was 0 percentile for age and sex matched control.   2. Normal coronary origin with right dominance.   3. No evidence of CAD. CAD-RADS 0. No evidence of CAD (0%). Consider non-atherosclerotic causes of chest pain.     Electronically Signed   By: Berniece Salines DO   On: 03/12/2021 17:07    Addended by Berniece Salines, DO on 03/12/2021  5:09 PM   Study Result  Narrative & Impression  EXAM: OVER-READ INTERPRETATION  CT CHEST   The following report is an over-read performed by radiologist Dr. Aletta Edouard of [facility], [place] on 03/12/2021. This over-read does not include interpretation of cardiac or coronary anatomy or pathology. The coronary CTA interpretation by the cardiologist is attached.   COMPARISON:  None.   FINDINGS: Vascular: No significant noncardiac  vascular findings.   Mediastinum/Nodes: Visualized mediastinum and hilar regions demonstrate no lymphadenopathy or masses.   Lungs/Pleura: Visualized lungs show no evidence of pulmonary edema, consolidation, pneumothorax, nodule or pleural fluid.   Upper Abdomen: No acute abnormality.   Musculoskeletal: No chest wall mass or suspicious bone lesions identified.   IMPRESSION: No significant incidental findings.   Electronically Signed: By: Aletta Edouard M.D. On: 03/12/2021 16:06    Recent Labs: 07/25/2020: Magnesium 1.6; Pro B Natriuretic peptide (BNP) 93.0  08/09/2020: ALT 100 10/26/2020: Hemoglobin 14.0; Platelets 300 02/23/2021: BUN 44; Creatinine, Ser 0.90; Potassium 4.5; Sodium 142  Recent Lipid Panel    Component Value Date/Time   TRIG 84 06/08/2020 0225    Physical Exam:    VS:  BP (!) 152/74 (BP Location: Left Arm, Patient Position: Sitting, Cuff Size: Large)   Pulse (!) 56   Ht _0  (1.549 m)   Wt 212 lb (96.2 kg)   BMI 40.06 kg/m     Wt Readings from Last 3 Encounters:  06/18/21 212 lb (96.2 kg)  04/02/21 200 lb (90.7 kg)  12/04/20 179 lb 12.8 oz (81.6 kg)     GEN: Well nourished, well developed in no acute distress HEENT: Normal NECK: No JVD; No carotid bruits LYMPHATICS: No lymphadenopathy CARDIAC: S1S2 noted,RRR, no murmurs, rubs, gallops RESPIRATORY:  Clear to auscultation without rales, wheezing or rhonchi  ABDOMEN: Soft, non-tender, non-distended, +bowel sounds, no guarding. EXTREMITIES: No edema, No cyanosis, no clubbing MUSCULOSKELETAL:  No deformity  SKIN: Warm and dry NEUROLOGIC:  Alert and oriented x 3, non-focal PSYCHIATRIC:  Normal affect, good insight  ASSESSMENT:    1. Primary hypertension   2. Mixed hyperlipidemia   3. Morbid obesity ([place])    PLAN:    Is slightly elevated in the office today.  she tells me at home her blood pressure is systolics being usually in the 140s.  Advised the patient take her blood pressure daily and  send me the information.  After that  review if her systolics is still in the 150s I plan to optimize her antihypertensive regimen.   Hyperlipidemia is diet Controlled.  The patient understands the need to lose weight with diet and exercise. We have discussed specific strategies for this.  The patient is in agreement with the above plan. The patient left the office in stable condition.  The patient will follow up in 9 months or sooner if needed.   Medication Adjustments/Labs and Tests Ordered: Current medicines are reviewed at length with the patient today.  Concerns regarding medicines are outlined above.  No orders of the defined types were placed in this encounter.  No orders of the defined types were placed in this encounter.   Patient Instructions  Medication Instructions:  Your physician recommends that you continue on your current medications as directed. Please refer to the Current Medication list given to you today. Please take your blood pressure daily for 1 week and send in a MyChart message.  *If you need a refill on your cardiac medications before your next appointment, please call your pharmacy*   Lab Work: None If you have labs (blood work) drawn today and your tests are completely normal, you will receive your results only by: [place] (if you have MyChart) OR A paper copy in the mail If you have any lab test that is abnormal or we need to change your treatment, we will call you to review the results.   Testing/Procedures: None   Follow-Up: At [facility], you and your health needs are our priority.  As part of our continuing mission to provide you with exceptional heart care, we have created designated Provider Care Teams.  These Care Teams include your primary Cardiologist (physician) and Advanced Practice Providers (APPs -  Physician Assistants and Nurse Practitioners) who all work together to provide you with the care you need, when you need  it.  We recommend signing up for the patient portal called "MyChart".  Sign up information is  provided on this After Visit Summary.  MyChart is used to connect with patients for Virtual Visits (Telemedicine).  Patients are able to view lab/test results, encounter notes, upcoming appointments, etc.  Non-urgent messages can be sent to your provider as well.   To learn more about what you can do with MyChart, go to [email or web address].    Your next appointment:   9 month(s)  The format for your next appointment:   In Person  Provider:   Berniece Salines, DO [address], [place], [place] [address]    Other Instructions     Adopting a Healthy Lifestyle.  Know what a healthy weight is for you (roughly BMI <25) and aim to maintain this   Aim for 7+ servings of fruits and vegetables daily   65-80+ fluid ounces of water or unsweet tea for healthy kidneys   Limit to max 1 drink of alcohol per day; avoid smoking/tobacco   Limit animal fats in diet for cholesterol and heart health - choose grass fed whenever available   Avoid highly processed foods, and foods high in saturated/trans fats   Aim for low stress - take time to unwind and care for your mental health   Aim for 150 min of moderate intensity exercise weekly for heart health, and weights twice weekly for bone health   Aim for 7-9 hours of sleep daily   When it comes to diets, agreement about the perfect plan isnt easy to find, even among the experts. Experts at the [employer] developed an idea known as the Healthy Eating Plate. Just imagine a plate divided into logical, healthy portions.   The emphasis is on diet quality:   Load up on vegetables and fruits - one-half of your plate: Aim for color and variety, and remember that potatoes dont count.   Go for whole grains - one-quarter of your plate: Whole wheat, barley, wheat berries, quinoa, oats, brown rice, and foods made with them. If you want  pasta, go with whole wheat pasta.   Protein power - one-quarter of your plate: Fish, chicken, beans, and nuts are all healthy, versatile protein sources. Limit red meat.   The diet, however, does go beyond the plate, offering a few other suggestions.   Use healthy plant oils, such as olive, canola, soy, corn, sunflower and peanut. Check the labels, and avoid partially hydrogenated oil, which have unhealthy trans fats.   If youre thirsty, drink water. Coffee and tea are good in moderation, but skip sugary drinks and limit milk and dairy products to one or two daily servings.   The type of carbohydrate in the diet is more important than the amount. Some sources of carbohydrates, such as vegetables, fruits, whole grains, and beans-are healthier than others.   Finally, stay active  Signed, Berniece Salines, DO  06/18/2021 8:22 AM    [place]

## 2021-08-25 IMAGING — US US EXTREM LOW VENOUS*L*
1 series · 14 of 24 positions shown · non-contrast
Comparison: None.

CLINICAL DATA: Left lower extremity swelling

EXAM:
LEFT LOWER EXTREMITY VENOUS DOPPLER ULTRASOUND
TECHNIQUE: Gray-scale sonography with compression, as well as color and duplex
ultrasound, were performed to evaluate the deep venous system(s)
from the level of the common femoral vein through the popliteal and
proximal calf veins.

[Series 1: us extrem low venous*left* · 14 of 32 slices shown]
[im 1/32]
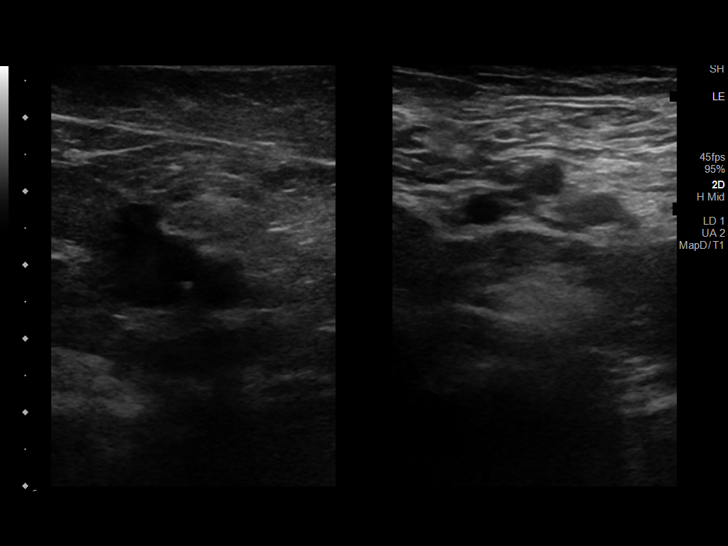
[im 3/32]
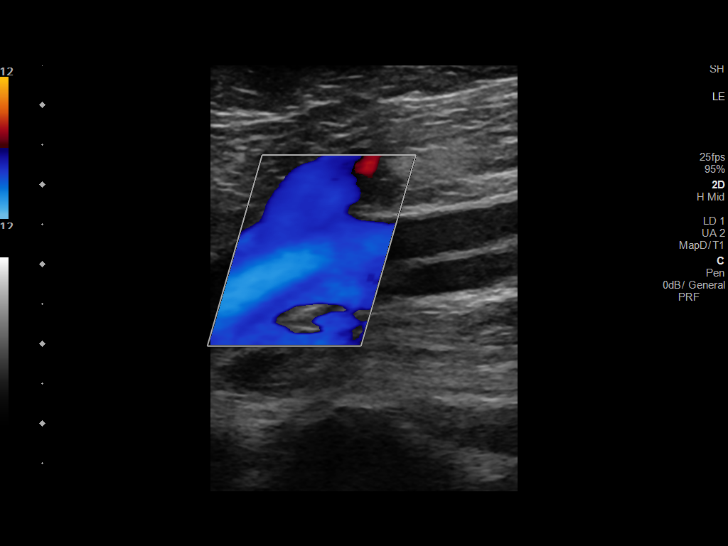
[im 6/32]
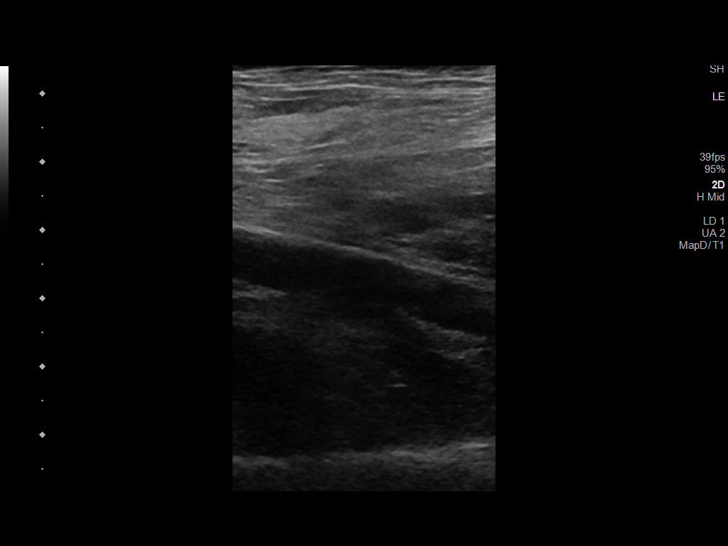
[im 9/32]
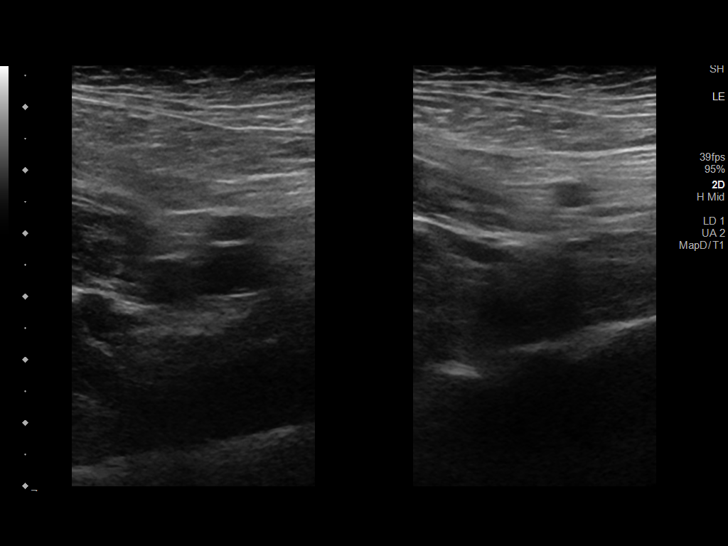
[im 10/32]
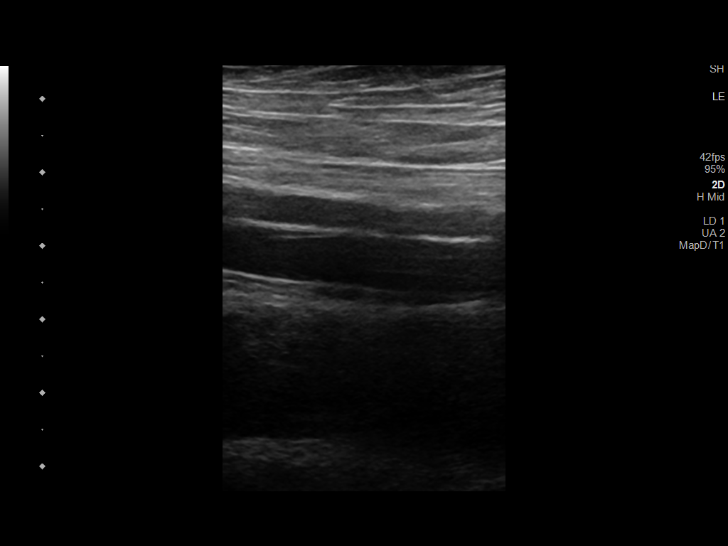
[im 13/32]
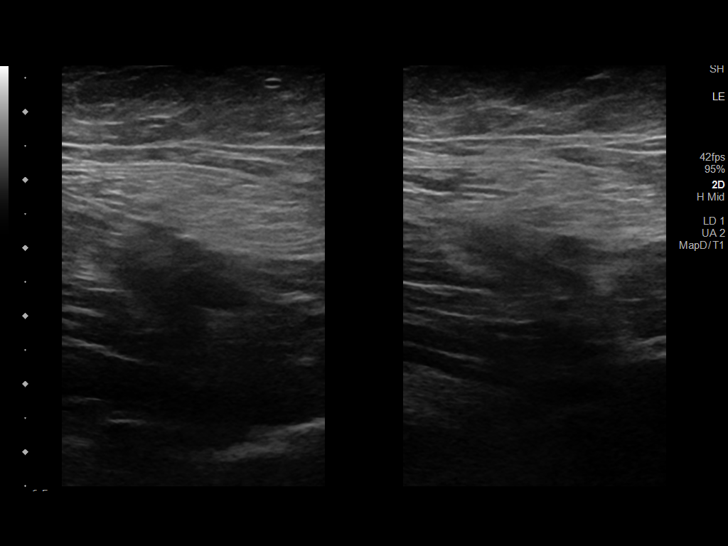
[im 15/32]
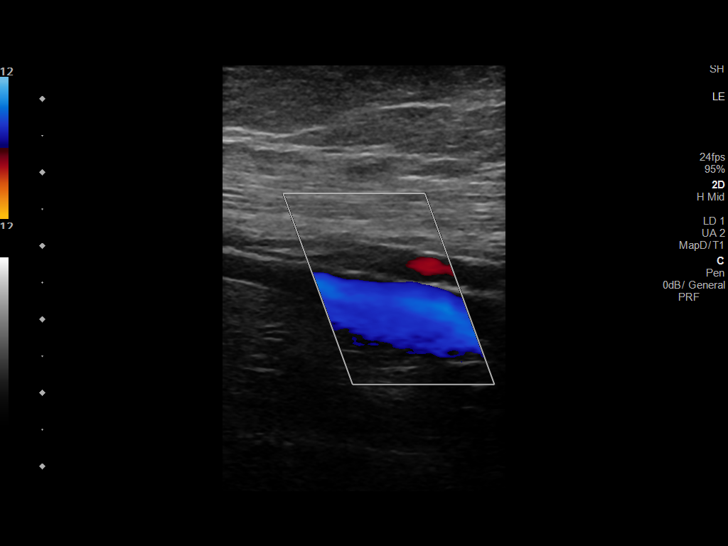
[im 17/32]
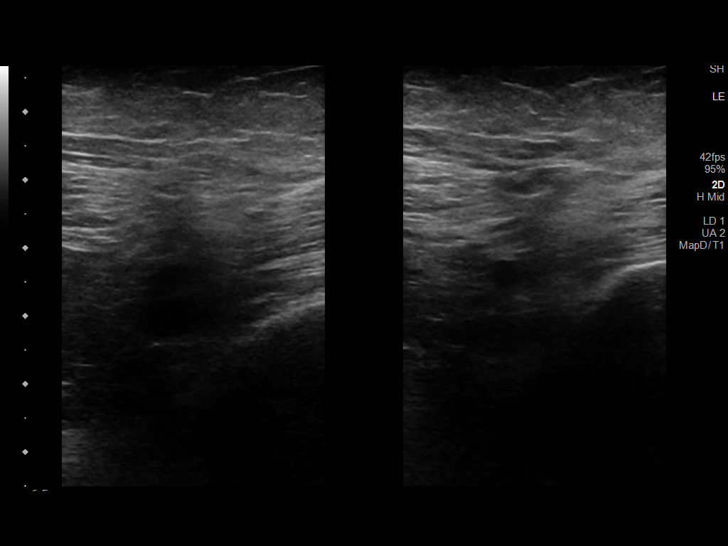
[im 19/32]
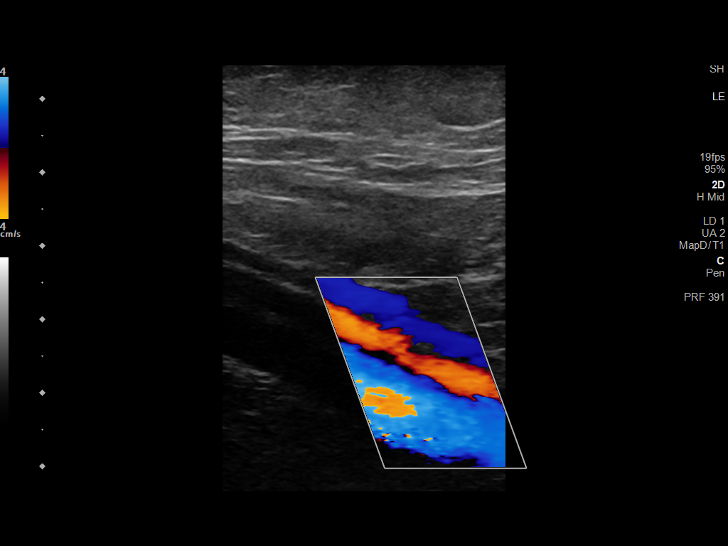
[im 22/32]
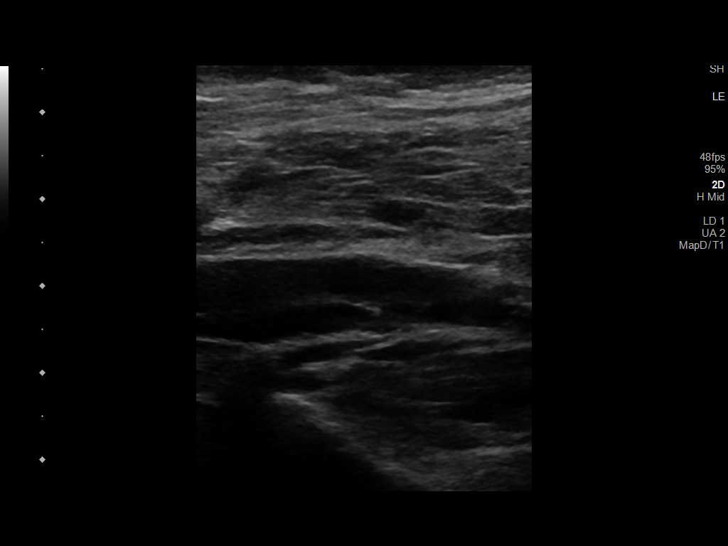
[im 25/32]
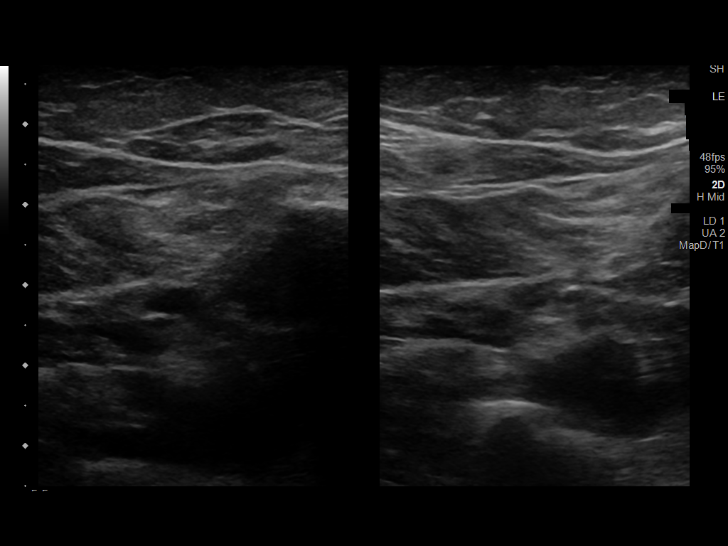
[im 26/32]
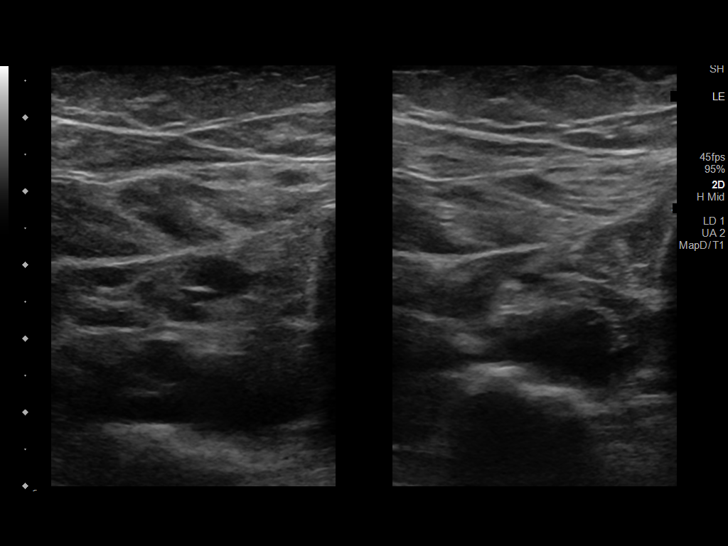
[im 29/32]
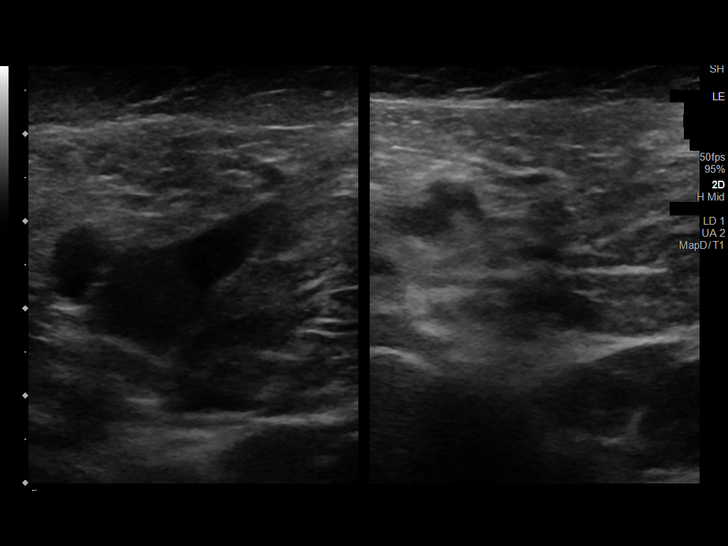
[im 32/32]
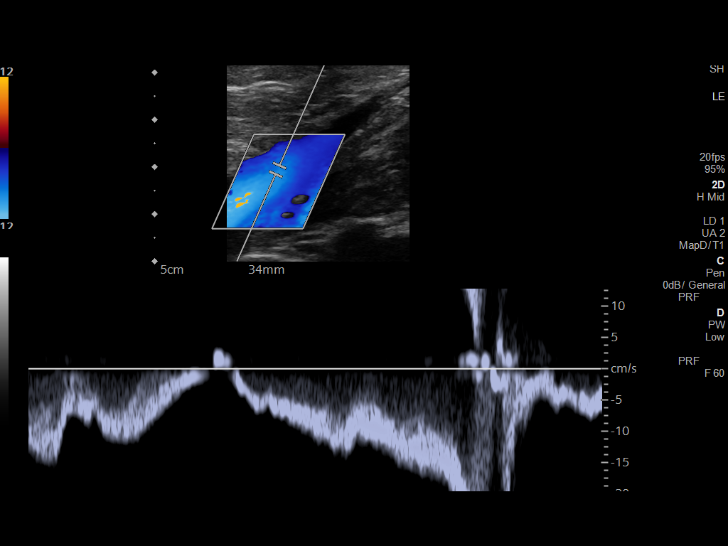

[14 of 24 positions shown; findings below may reference images not displayed]

FINDINGS: VENOUS

Normal compressibility of the common femoral, superficial femoral,
and popliteal veins, as well as the visualized calf veins.
Visualized portions of profunda femoral vein and great saphenous
vein unremarkable. No filling defects to suggest DVT on grayscale or
color Doppler imaging. Doppler waveforms show normal direction of
venous flow, normal respiratory plasticity and response to
augmentation.

Limited views of the contralateral common femoral vein are
unremarkable.

OTHER

None.

Limitations: none
IMPRESSION: 1. No evidence of deep venous thrombosis within the left lower
extremity.

## 2021-08-25 IMAGING — DX DG FOOT COMPLETE 3+V*L*
3 series · 3 of 3 positions shown · non-contrast
Comparison: None.

CLINICAL DATA: Left foot pain since 03/28/2021. The patient may
have suffered an injury.

EXAM:
LEFT FOOT - COMPLETE 3+ VIEW

[foot ap]
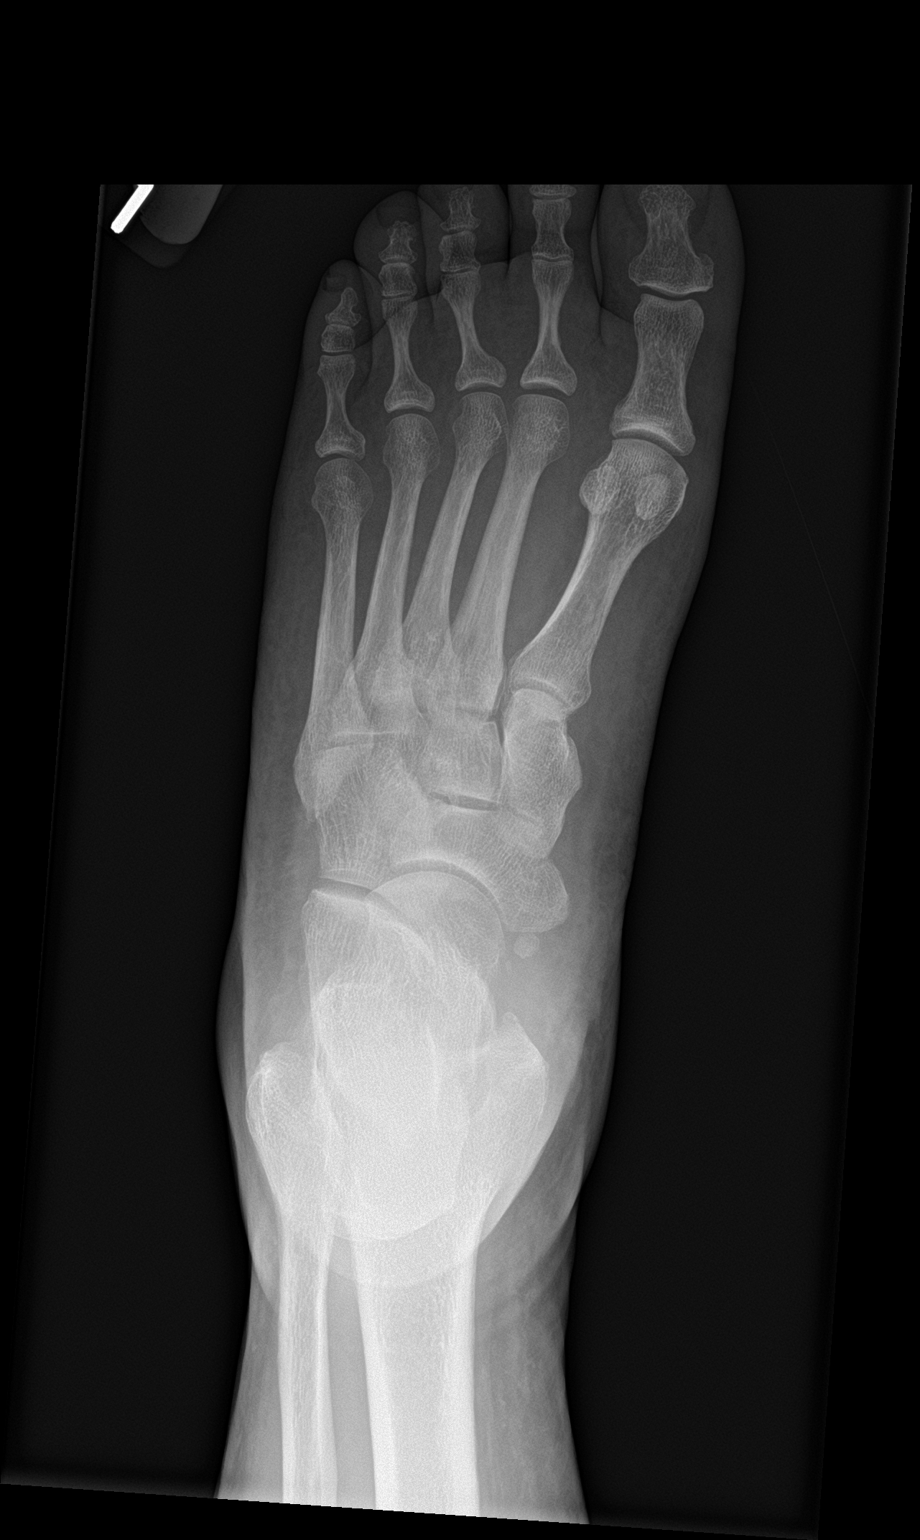

[foot obl]
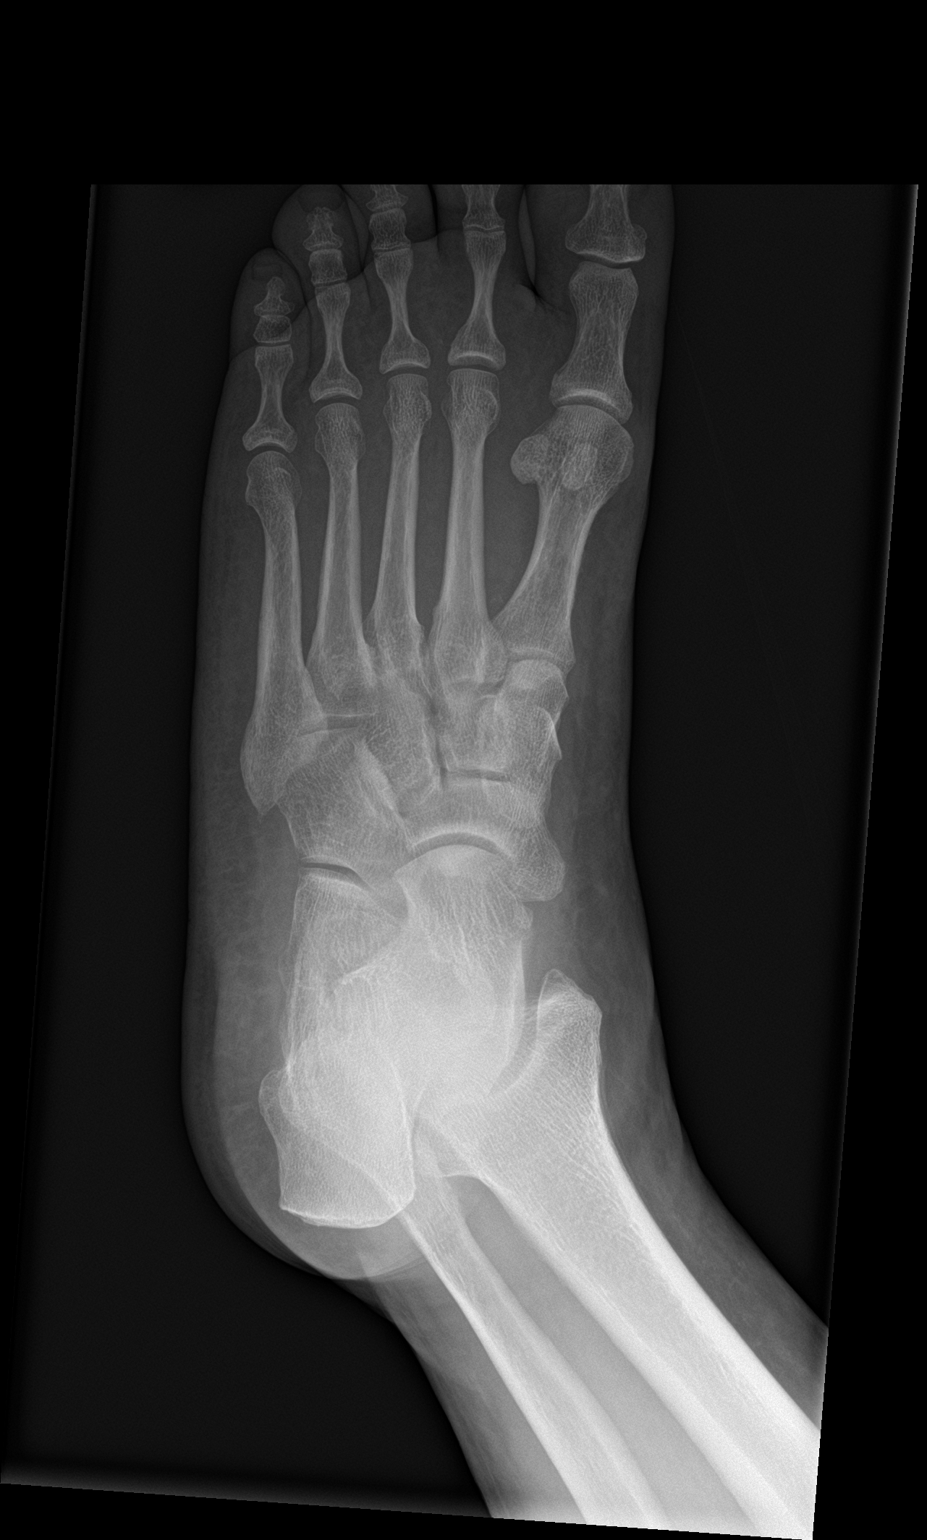

[foot lat]
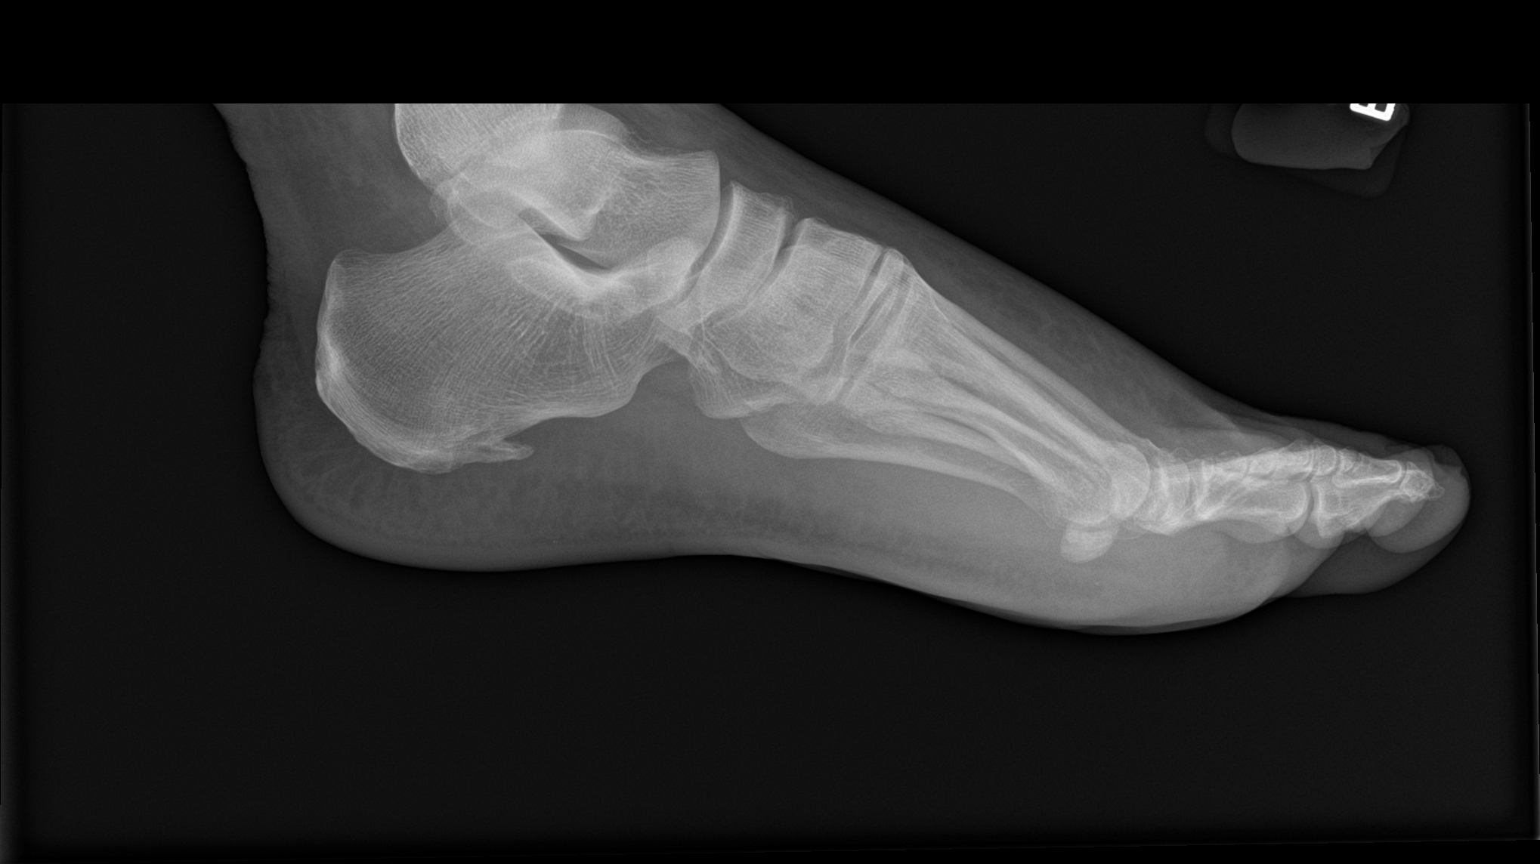

[3 of 3 positions shown; findings below may reference images not displayed]

FINDINGS: There is no evidence of fracture or dislocation. There is no
evidence of arthropathy or other focal bone abnormality. Plantar
calcaneal spur and small accessory ossicle off the navicular are
noted. Soft tissues are unremarkable.
IMPRESSION: Plantar calcaneal spur.  Otherwise negative.

## 2021-10-30 ENCOUNTER — Other Ambulatory Visit: Payer: Self-pay | Admitting: Cardiology

## 2021-11-26 ENCOUNTER — Telehealth: Payer: Medicare PPO | Admitting: Physician Assistant

## 2021-11-26 DIAGNOSIS — L03115 Cellulitis of right lower limb: Secondary | ICD-10-CM | POA: Diagnosis not present

## 2021-11-26 MED ORDER — CEPHALEXIN 500 MG PO CAPS: 500.0000 mg | ORAL_CAPSULE | Freq: Four times a day (QID) | ORAL | 0 refills | Status: DC

## 2021-11-26 NOTE — Progress Notes (Signed)
?Virtual Visit Consent  ? ?Latoya Rios, you are scheduled for a virtual visit with a [facility] provider today.   ?  ?Just as with appointments in the office, your consent must be obtained to participate.  Your consent will be active for this visit and any virtual visit you may have with one of our providers in the next 365 days.   ?  ?If you have a MyChart account, a copy of this consent can be sent to you electronically.  All virtual visits are billed to your insurance company just like a traditional visit in the office.   ? ?As this is a virtual visit, video technology does not allow for your provider to perform a traditional examination.  This may limit your provider's ability to fully assess your condition.  If your provider identifies any concerns that need to be evaluated in person or the need to arrange testing (such as labs, EKG, etc.), we will make arrangements to do so.   ?  ?Although advances in technology are sophisticated, we cannot ensure that it will always work on either your end or our end.  If the connection with a video visit is poor, the visit may have to be switched to a telephone visit.  With either a video or telephone visit, we are not always able to ensure that we have a secure connection.    ? ?I need to obtain your verbal consent now.   Are you willing to proceed with your visit today?  ?  ?Latoya Rios has provided verbal consent on 11/26/2021 for a virtual visit (video or telephone). ?  ?Latoya Loveless, PA-C  ? ?Date: 11/26/2021 2:29 PM ? ? ?Virtual Visit via Video Note  ? ?Latoya Rios, connected with  Latoya Rios  ([id number], 07-Sep-1956) on 11/26/21 at  2:15 PM EDT by a video-enabled telemedicine application and verified that I am speaking with the correct person using two identifiers. Daughter, [name], assisted in visit. ? ?Location: ?Patient: Virtual Visit Location Patient: Home ?Provider: Virtual Visit Location Provider: Home Office ?  ?I discussed the limitations  of evaluation and management by telemedicine and the availability of in person appointments. The patient expressed understanding and agreed to proceed.   ? ?History of Present Illness: ?Latoya Rios is a 66 y.o. who identifies as a female who was assigned female at birth, and is being seen today for redness, swelling and pain to right foot. Does have history of AKI and PAD. Has had cellulitis in left foot last July 2022. In 2021, had similar incident in right foot, but was diagnosed with PAD at that time. Prior to this instance, did go to have a pedicure. Symptoms started 2 days ago. Has been elevating without improvement. ? ? ?Problems:  ?Patient Active Problem List  ? Diagnosis Date Noted  ? Morbid obesity (HCC) 06/18/2021  ? Acute cystitis without hematuria 06/24/2020  ? ARF (acute renal failure) (HCC) 06/24/2020  ? AKI (acute kidney injury) (HCC) 06/24/2020  ? Acute kidney injury (HCC) 06/23/2020  ? Hypotension due to drugs 06/22/2020  ? CHF (congestive heart failure) (HCC)   ? Heart murmur   ? Hypertension   ? Obese   ? Acute hypoxemic respiratory failure due to COVID-19 [facility]) 06/08/2020  ? Accelerated hypertension 06/08/2020  ? Obesity (BMI 30-39.9) 06/08/2020  ? Demand ischemia (HCC) 06/08/2020  ? Drug declined by patient 02/29/2020  ? Gynecologic exam normal 08/16/2019  ? Abnormal renal function 03/01/2019  ? Encounter for  preventative adult health care exam with abnormal findings 06/12/2017  ? Mixed hyperlipidemia 05/29/2016  ? Class 2 severe obesity due to excess calories with serious comorbidity and body mass index (BMI) of 37.0 to 37.9 in adult [facility]) 05/29/2016  ?  ?Allergies:  ?Allergies  ?Allergen Reactions  ? Clonidine Other (See Comments)  ?  unk  ? Nisoldipine Other (See Comments)  ?  unk  ? Penicillins Other (See Comments)  ?  welps  ? Quinacrine Other (See Comments)  ?  unk  ? Quinapril Other (See Comments)  ?  unk  ? ?Medications:  ?Current Outpatient Medications:  ?  aspirin EC 81 MG EC tablet,  Take 1 tablet (81 mg total) by mouth daily. Swallow whole., Disp: 30 tablet, Rfl: 11 ?  carvedilol (COREG) 6.25 MG tablet, Take 1 tablet (6.25 mg total) by mouth 2 (two) times daily with a meal., Disp: 180 tablet, Rfl: 1 ?  cephALEXin (KEFLEX) 500 MG capsule, Take 1 capsule (500 mg total) by mouth 4 (four) times daily., Disp: 28 capsule, Rfl: 0 ?  Cholecalciferol (VITAMIN D3) 10 MCG (400 UNIT) CAPS, Take by mouth in the morning and at bedtime., Disp: , Rfl:  ?  losartan (COZAAR) 50 MG tablet, TAKE 2 TABLETS BY MOUTH EVERY DAY, Disp: 180 tablet, Rfl: 1 ?  nitroGLYCERIN (NITROSTAT) 0.4 MG SL tablet, Place 1 tablet (0.4 mg total) under the tongue every 5 (five) minutes as needed., Disp: 75 tablet, Rfl: 1 ?  triamterene-hydrochlorothiazide (MAXZIDE-25) 37.5-25 MG tablet, TAKE 1 TABLET BY MOUTH DAILY. DISCONTINUE CHLORTHALIDONE, Disp: 90 tablet, Rfl: 1 ? ?Observations/Objective: ?Patient is well-developed, well-nourished in no acute distress.  ?Resting comfortably at home.  ?Head is normocephalic, atraumatic.  ?No labored breathing.  ?Speech is clear and coherent with logical content.  ?Patient is alert and oriented at baseline.  ?Right foot is swollen and red. Had daughter check cap refill on right great toe and was 2 seconds. Daughter reports foot is warm to touch ? ?Assessment and Plan: ?1. Cellulitis of right lower extremity ?- cephALEXin (KEFLEX) 500 MG capsule; Take 1 capsule (500 mg total) by mouth 4 (four) times daily.  Dispense: 28 capsule; Refill: 0 ? ?- Suspect cellulitis ?- Keflex prescribed ?- Keep elevated as tolerated ?- Good lotion ?- Keep scheduled f/u with PCP on Wednesday ? ?Follow Up Instructions: ?I discussed the assessment and treatment plan with the patient. The patient was provided an opportunity to ask questions and all were answered. The patient agreed with the plan and demonstrated an understanding of the instructions.  A copy of instructions were sent to the patient via MyChart unless  otherwise noted below.  ? ?The patient was advised to call back or seek an in-person evaluation if the symptoms worsen or if the condition fails to improve as anticipated. ? ?Time:  ?I spent 15 minutes with the patient via telehealth technology discussing the above problems/concerns.   ? ?Latoya Loveless, PA-C ? ?

## 2021-11-26 NOTE — Patient Instructions (Signed)
Latoya GlassmanLinda Vaeth, thank you for joining Margaretann LovelessJennifer M Keiva Dina, PA-C for today's virtual visit.  While this provider is not your primary care provider (PCP), if your PCP is located in our provider database this encounter information will be shared with them immediately following your visit. ? ?Consent: ?(Patient) Latoya GlassmanLinda Castellanos provided verbal consent for this virtual visit at the beginning of the encounter. ? ?Current Medications: ? ?Current Outpatient Medications:  ?  aspirin EC 81 MG EC tablet, Take 1 tablet (81 mg total) by mouth daily. Swallow whole., Disp: 30 tablet, Rfl: 11 ?  carvedilol (COREG) 6.25 MG tablet, Take 1 tablet (6.25 mg total) by mouth 2 (two) times daily with a meal., Disp: 180 tablet, Rfl: 1 ?  cephALEXin (KEFLEX) 500 MG capsule, Take 1 capsule (500 mg total) by mouth 4 (four) times daily., Disp: 28 capsule, Rfl: 0 ?  Cholecalciferol (VITAMIN D3) 10 MCG (400 UNIT) CAPS, Take by mouth in the morning and at bedtime., Disp: , Rfl:  ?  losartan (COZAAR) 50 MG tablet, TAKE 2 TABLETS BY MOUTH EVERY DAY, Disp: 180 tablet, Rfl: 1 ?  nitroGLYCERIN (NITROSTAT) 0.4 MG SL tablet, Place 1 tablet (0.4 mg total) under the tongue every 5 (five) minutes as needed., Disp: 75 tablet, Rfl: 1 ?  triamterene-hydrochlorothiazide (MAXZIDE-25) 37.5-25 MG tablet, TAKE 1 TABLET BY MOUTH DAILY. DISCONTINUE CHLORTHALIDONE, Disp: 90 tablet, Rfl: 1  ? ?Medications ordered in this encounter:  ?Meds ordered this encounter  ?Medications  ? cephALEXin (KEFLEX) 500 MG capsule  ?  Sig: Take 1 capsule (500 mg total) by mouth 4 (four) times daily.  ?  Dispense:  28 capsule  ?  Refill:  0  ?  Order Specific Question:   Supervising Provider  ?  Answer:   Eber HongMILLER, BRIAN [[id number]]  ?  ? ?*If you need refills on other medications prior to your next appointment, please contact your pharmacy* ? ?Follow-Up: ?Call back or seek an in-person evaluation if the symptoms worsen or if the condition fails to improve as anticipated. ? ?Other  Instructions ?Cellulitis, Adult ?Cellulitis is a skin infection. The infected area is usually warm, red, swollen, and tender. This condition occurs most often in the arms and lower legs. The infection can travel to the muscles, blood, and underlying tissue and become serious. It is very important to get treated for this condition. ?What are the causes? ?Cellulitis is caused by bacteria. The bacteria enter through a break in the skin, such as a cut, burn, insect bite, open sore, or crack. ?What increases the risk? ?This condition is more likely to occur in people who: ?Have a weak body defense system (immune system). ?Have open wounds on the skin, such as cuts, burns, bites, and scrapes. Bacteria can enter the body through these open wounds. ?Are older than 66 years of age. ?Have diabetes. ?Have a type of long-lasting (chronic) liver disease (cirrhosis) or kidney disease. ?Are obese. ?Have a skin condition such as: ?Itchy rash (eczema). ?Slow movement of blood in the veins (venous stasis). ?Fluid buildup below the skin (edema). ?Have had radiation therapy. ?Use IV drugs. ?What are the signs or symptoms? ?Symptoms of this condition include: ?Redness, streaking, or spotting on the skin. ?Swollen area of the skin. ?Tenderness or pain when an area of the skin is touched. ?Warm skin. ?A fever. ?Chills. ?Blisters. ?How is this diagnosed? ?This condition is diagnosed based on a medical history and physical exam. You may also have tests, including: ?Blood tests. ?Imaging tests. ?How is this  treated? ?Treatment for this condition may include: ?Medicines, such as antibiotic medicines or medicines to treat allergies (antihistamines). ?Supportive care, such as rest and application of cold or warm cloths (compresses) to the skin. ?Hospital care, if the condition is severe. ?The infection usually starts to get better within 1-2 days of treatment. ?Follow these instructions at home: ?Medicines ?Take over-the-counter and  prescription medicines only as told by your health care provider. ?If you were prescribed an antibiotic medicine, take it as told by your health care provider. Do not stop taking the antibiotic even if you start to feel better. ?General instructions ?Drink enough fluid to keep your urine pale yellow. ?Do not touch or rub the infected area. ?Raise (elevate) the infected area above the level of your heart while you are sitting or lying down. ?Apply warm or cold compresses to the affected area as told by your health care provider. ?Keep all follow-up visits as told by your health care provider. This is important. These visits let your health care provider make sure a more serious infection is not developing. ?Contact a health care provider if: ?You have a fever. ?Your symptoms do not begin to improve within 1-2 days of starting treatment. ?Your bone or joint underneath the infected area becomes painful after the skin has healed. ?Your infection returns in the same area or another area. ?You notice a swollen bump in the infected area. ?You develop new symptoms. ?You have a general ill feeling (malaise) with muscle aches and pains. ?Get help right away if: ?Your symptoms get worse. ?You feel very sleepy. ?You develop vomiting or diarrhea that persists. ?You notice red streaks coming from the infected area. ?Your red area gets larger or turns dark in color. ?These symptoms may represent a serious problem that is an emergency. Do not wait to see if the symptoms will go away. Get medical help right away. Call your local emergency services (911 in the [place] Do not drive yourself to the hospital. ?Summary ?Cellulitis is a skin infection. This condition occurs most often in the arms and lower legs. ?Treatment for this condition may include medicines, such as antibiotic medicines or antihistamines. ?Take over-the-counter and prescription medicines only as told by your health care provider. If you were prescribed an antibiotic  medicine, do not stop taking the antibiotic even if you start to feel better. ?Contact a health care provider if your symptoms do not begin to improve within 1-2 days of starting treatment or your symptoms get worse. ?Keep all follow-up visits as told by your health care provider. This is important. These visits let your health care provider make sure that a more serious infection is not developing. ?This information is not intended to replace advice given to you by your health care provider. Make sure you discuss any questions you have with your health care provider. ?Document Revised: 09/13/2019 Document Reviewed: 01/22/2018 ?Elsevier Patient Education ? 2022 Elsevier Inc. ? ? ? ?If you have been instructed to have an in-person evaluation today at a local Urgent Care facility, please use the link below. It will take you to a list of all of our available [facility], including address, phone number and hours of operation. Please do not delay care.  ?[facility] ? ?If you or a family member do not have a primary care provider, use the link below to schedule a visit and establish care. When you choose a [facility] primary care physician or advanced practice provider, you gain  a long-term partner in health. ?Find a Primary Care Provider ? ?Learn more about [facility] in-office and virtual care options: ?[facility] Get Care Now ?

## 2021-11-28 ENCOUNTER — Other Ambulatory Visit (HOSPITAL_BASED_OUTPATIENT_CLINIC_OR_DEPARTMENT_OTHER): Payer: Self-pay

## 2021-11-28 ENCOUNTER — Encounter: Payer: Self-pay | Admitting: Medical

## 2021-11-28 ENCOUNTER — Ambulatory Visit (INDEPENDENT_AMBULATORY_CARE_PROVIDER_SITE_OTHER): Payer: Medicare PPO | Admitting: Medical

## 2021-11-28 VITALS — BP 140/68 | HR 57 | Resp 18 | Ht 61.0 in

## 2021-11-28 DIAGNOSIS — M79671 Pain in right foot: Secondary | ICD-10-CM

## 2021-11-28 DIAGNOSIS — L03115 Cellulitis of right lower limb: Secondary | ICD-10-CM | POA: Diagnosis not present

## 2021-11-28 LAB — CBC WITH DIFFERENTIAL/PLATELET
Basophils Absolute: 0.1 10*3/uL (ref 0.0–0.1)
Basophils Relative: 0.5 % (ref 0.0–3.0)
Eosinophils Absolute: 0.1 10*3/uL (ref 0.0–0.7)
Eosinophils Relative: 0.6 % (ref 0.0–5.0)
HCT: 38 % (ref 36.0–46.0)
Hemoglobin: 12.6 g/dL (ref 12.0–15.0)
Lymphocytes Relative: 19.5 % (ref 12.0–46.0)
Lymphs Abs: 2 10*3/uL (ref 0.7–4.0)
MCHC: 33.3 g/dL (ref 30.0–36.0)
MCV: 87.5 fl (ref 78.0–100.0)
Monocytes Absolute: 1.1 10*3/uL — ABNORMAL HIGH (ref 0.1–1.0)
Monocytes Relative: 10.2 % (ref 3.0–12.0)
Neutro Abs: 7.2 10*3/uL (ref 1.4–7.7)
Neutrophils Relative %: 69.2 % (ref 43.0–77.0)
Platelets: 296 10*3/uL (ref 150.0–400.0)
RBC: 4.34 Mil/uL (ref 3.87–5.11)
RDW: 12.8 % (ref 11.5–15.5)
WBC: 10.5 10*3/uL (ref 4.0–10.5)

## 2021-11-28 LAB — URIC ACID: Uric Acid, Serum: 11.5 mg/dL — ABNORMAL HIGH (ref 2.4–7.0)

## 2021-11-28 MED ORDER — DOXYCYCLINE HYCLATE 100 MG PO TABS
100.0000 mg | ORAL_TABLET | Freq: Two times a day (BID) | ORAL | 0 refills | Status: DC
  Filled 2021-11-28: qty 20, 10d supply, fill #0

## 2021-11-28 MED ORDER — PREDNISONE 10 MG PO TABS
ORAL_TABLET | ORAL | 0 refills | Status: DC
  Filled 2021-11-28: qty 21, 6d supply, fill #0

## 2021-11-28 NOTE — Patient Instructions (Addendum)
Rt foot and ankle pain persisting despite treatment. No trauma. Swollen and warm. No severe color changes. Cellulitis vs gout. Not responding to keflex.  ? ? ?Stop keflex and switch to doxycycline(provides better coverage). Rx advisement given.  ? ?Cbc and uric acid today. ? ?If studies negative and pain persist then get xray of foot/ankle. ? ?If foot color changes to redness with expansion up foot then ED evaluation. ? ?Follow up in 2-5 days or sooner if needed. ?

## 2021-11-28 NOTE — Progress Notes (Signed)
? ?Subjective:  ? ? Patient ID: Latoya Rios, female    DOB: 31-Dec-1955, 66 y.o.   MRN: [id number] ? ?HPI ? ?Pt in with rt foot area pain since Friday. Some pain in rt ankle area as well. No trauma or fall preceding. But did report going to salon/pedicure some days before pain on set.  ? ?Since Monday this week hurts to walk. ? ?Pt given keflex 4 times a day. Not improved at all with keflex. ? ?Review of Systems  ?Constitutional:  Positive for chills. Negative for fatigue and fever.  ?Respiratory:  Negative for cough, chest tightness, shortness of breath and wheezing.   ?Cardiovascular:  Negative for chest pain.  ?Gastrointestinal:  Negative for abdominal pain.  ?Musculoskeletal:   ?     See hpi.  ?Neurological:  Negative for dizziness, seizures, weakness, light-headedness and headaches.  ?Hematological:  Negative for adenopathy. Does not bruise/bleed easily.  ?Psychiatric/Behavioral:  Negative for behavioral problems, decreased concentration, dysphoric mood and hallucinations.   ? ? ?Past Medical History:  ?Diagnosis Date  ? Abnormal renal function 03/01/2019  ? Formatting of this note is different from the original. eGFR  >=60.0 mL/min/1.14m2 >60.0  31.2Low  CM  >60.0 CM     Last Assessment & Plan:  Formatting of this note might be different from the original. Resolved as of last labs Rechecking labs to ensure stability  ? Accelerated hypertension 06/08/2020  ? Acute cystitis without hematuria 06/24/2020  ? Acute hypoxemic respiratory failure due to COVID-19 ([facility] 06/08/2020  ? Acute kidney injury ([facility] 06/23/2020  ? [facility] 06/24/2020  ? [place]) ([place] 06/24/2020  ? Benign essential hypertension 05/29/2016  ? Last Assessment & Plan:  Formatting of this note is different from the original. Pertinent Data:   Current medication includes: carvediloL - 3.125 mg losartan-hydroCHLOROthiazide - 100-25 mg .  BP Readings from Last 3 Encounters:  02/29/20 (!) 206/86  08/31/19 (!) 142/78   08/16/19 (!) 160/56   LDL Calculated (mg/dL)  Date Value  08/31/2019 144 (H)  03/01/2019 156 (H)   Creatinine (mg/dL)  Date Val  ? [facility]   ? Class 2 severe obesity due to excess calories with serious comorbidity and body mass index (BMI) of 37.0 to 37.9 in adult ([facility] 05/29/2016  ? Formatting of this note is different from the original. Wt Readings from Last 3 Encounters:  08/31/19 233 lb  08/16/19 239 lb  03/01/19 249 lb    Last Assessment & Plan:  Formatting of this note might be different from the original. BMI Assessment: Current Body mass index is 37.22 kg/m?.Marland Kitchen Patient BMI currently is above average (>25 kg/m2); BMI follow up plan is completed . Current barriers to heal  ? Demand ischemia ([place] 06/08/2020  ? Drug declined by patient 02/29/2020  ? Last Assessment & Plan:  Formatting of this note might be different from the original. Patient historically has declined statins and at some point was on losartan/HCTZ and carvedilol, but stopped taking them stating " I dont want to take medications ".  Today I again discussed with patient the risks of uncontrolled HYPERLIPIDEMIA and HYPERTENSION including but not limited to strokes, MIs, renal in  ? Encounter for preventative adult health care exam with abnormal findings 06/12/2017  ? Last Assessment & Plan:  Formatting of this note might be different from the original. Patient for wellness visit.  Overall doing good. No new concerns. Reviewed appropriate age screenings and  vaccinations.  Influenza vaccine: Patient Declined Tdap/TD: Patient Declined Zoster vaccine: Patient Declined  Colonoscopy: UTD, due 2022 Mammogram: UTD  Depression screening: negative No falls reported  Dis  ? Gynecologic exam normal 08/16/2019  ? Last Assessment & Plan:  Formatting of this note might be different from the original. Pap smear with high risk hpv collected for screening Discussed and encouraged healthy lifestyle.  Always use sunscreen when sun exposed.   Encouraged healthy diet with fresh fruits, vegetables, and lean meat.  Encouraged regular exercise for heart health and bone health.  Encouraged continue monthly SBE and yearl  ? Heart murmur   ? Hypertension   ? Hypotension due to drugs 06/22/2020  ? Mixed hyperlipidemia   ? Obese   ? Obesity (BMI 30-39.9) 06/08/2020  ? Pneumonia due to COVID-19 virus 06/08/2020  ? ?  ?Social History  ? ?Socioeconomic History  ? Marital status: Widowed  ?  Spouse name: Not on file  ? Number of children: Not on file  ? Years of education: Not on file  ? Highest education level: Not on file  ?Occupational History  ? Not on file  ?Tobacco Use  ? Smoking status: Never  ? Smokeless tobacco: Never  ?Substance and Sexual Activity  ? Alcohol use: Never  ? Drug use: Never  ? Sexual activity: Not Currently  ?Other Topics Concern  ? Not on file  ?Social History Narrative  ? Not on file  ? ?Social Determinants of Health  ? ?Financial Resource Strain: Not on file  ?Food Insecurity: Not on file  ?Transportation Needs: Not on file  ?Physical Activity: Not on file  ?Stress: Not on file  ?Social Connections: Not on file  ?Intimate Partner Violence: Not on file  ? ? ?Past Surgical History:  ?Procedure Laterality Date  ? COLONOSCOPY    ? DENTAL SURGERY    ? ? ?Family History  ?Problem Relation Age of Onset  ? Emphysema Mother   ? Lung disease Mother   ? Brain cancer Father   ? ? ?Allergies  ?Allergen Reactions  ? Clonidine Other (See Comments)  ?  unk  ? Nisoldipine Other (See Comments)  ?  unk  ? Penicillins Other (See Comments)  ?  welps  ? Quinacrine Other (See Comments)  ?  unk  ? Quinapril Other (See Comments)  ?  unk  ? ? ?Current Outpatient Medications on File Prior to Visit  ?Medication Sig Dispense Refill  ? aspirin EC 81 MG EC tablet Take 1 tablet (81 mg total) by mouth daily. Swallow whole. 30 tablet 11  ? carvedilol (COREG) 6.25 MG tablet Take 1 tablet (6.25 mg total) by mouth 2 (two) times daily with a meal. 180 tablet 1  ? Cholecalciferol  (VITAMIN D3) 10 MCG (400 UNIT) CAPS Take by mouth in the morning and at bedtime.    ? losartan (COZAAR) 50 MG tablet TAKE 2 TABLETS BY MOUTH EVERY DAY 180 tablet 1  ? triamterene-hydrochlorothiazide (MAXZIDE-25) 37.5-25 MG tablet TAKE 1 TABLET BY MOUTH DAILY. DISCONTINUE CHLORTHALIDONE 90 tablet 1  ? nitroGLYCERIN (NITROSTAT) 0.4 MG SL tablet Place 1 tablet (0.4 mg total) under the tongue every 5 (five) minutes as needed. 75 tablet 1  ? ?No current facility-administered medications on file prior to visit.  ? ? ?BP 140/68   Pulse (!) 57   Resp 18   Ht '5\' 1"'  (1.549 m)   SpO2 100%   BMI 40.06 kg/m?  ?  ?   ?Objective:  ?  Physical Exam ? ?General ?Mental Status- Alert. General Appearance- Not in acute distress.  ? ?Skin ?General: Color- Normal Color. Moisture- Normal Moisture. ? ?Neck ?Carotid Arteries- Normal color. Moisture- Normal Moisture. No carotid bruits. No JVD. ? ?Chest and Lung Exam ?Auscultation: ?Breath Sounds:-Normal. ? ?Cardiovascular ?Auscultation:Rythm- Regular. ?Murmurs & Other Heart Sounds:Auscultation of the heart reveals- No Murmurs. ? ?Abdomen ?Inspection:-Inspeection Normal. ?Palpation/Percussion:Note:No mass. Palpation and Percussion of the abdomen reveal- Non Tender, Non Distended + BS, no rebound or guarding. ? ?Neurologic ?Cranial Nerve exam:- CN III-XII intact(No nystagmus), symmetric smile. ?Strength:- 5/5 equal and symmetric strength both upper and lower extremities.  ? ? ?   ?Assessment & Plan:  ? ?Patient Instructions  ?Rt foot and ankle pain persisting despite treatment. No trauma. Swollen and warm. No severe color changes. Cellulitis vs gout. Not responding to keflex.  ? ? ?Stop keflex and switch to doxycycline(provides better coverage). Rx advisement given.  ? ?Cbc and uric acid today. ? ?If studies negative and pain persist then get xray of foot/ankle. ? ?If foot color changes to redness with expansion up foot then ED evaluation. ? ?Follow up in 2-5 days or sooner if needed.   ? ? ?Mackie Pai, PA-C  ? ?  ? ?

## 2021-11-28 NOTE — Addendum Note (Signed)
Addended by: Gwenevere Abbot on: 11/28/2021 04:40 PM ? ? Modules accepted: Orders ? ?

## 2021-11-29 ENCOUNTER — Encounter: Payer: Self-pay | Admitting: Medical

## 2021-11-30 ENCOUNTER — Ambulatory Visit: Payer: Medicare PPO | Admitting: Medical

## 2021-12-14 ENCOUNTER — Ambulatory Visit: Payer: Medicare PPO | Admitting: Medical

## 2022-01-04 ENCOUNTER — Ambulatory Visit: Payer: Medicare PPO | Admitting: Medical

## 2022-01-04 VITALS — BP 130/68 | HR 58 | Temp 98.2°F | Resp 18 | Ht 61.0 in | Wt 216.6 lb

## 2022-01-04 DIAGNOSIS — I10 Essential (primary) hypertension: Secondary | ICD-10-CM

## 2022-01-04 DIAGNOSIS — M1000 Idiopathic gout, unspecified site: Secondary | ICD-10-CM

## 2022-01-04 DIAGNOSIS — R944 Abnormal results of kidney function studies: Secondary | ICD-10-CM

## 2022-01-04 LAB — COMPREHENSIVE METABOLIC PANEL
ALT: 12 U/L (ref 0–35)
AST: 14 U/L (ref 0–37)
Albumin: 4 g/dL (ref 3.5–5.2)
Alkaline Phosphatase: 63 U/L (ref 39–117)
BUN: 41 mg/dL — ABNORMAL HIGH (ref 6–23)
CO2: 26 mEq/L (ref 19–32)
Calcium: 9.3 mg/dL (ref 8.4–10.5)
Chloride: 105 mEq/L (ref 96–112)
Creatinine, Ser: 1.19 mg/dL (ref 0.40–1.20)
GFR: 47.88 mL/min — ABNORMAL LOW (ref >60.00)
Glucose, Bld: 86 mg/dL (ref 70–99)
Potassium: 4.6 mEq/L (ref 3.5–5.1)
Sodium: 140 mEq/L (ref 135–145)
Total Bilirubin: 0.5 mg/dL (ref 0.2–1.2)
Total Protein: 6.9 g/dL (ref 6.0–8.3)

## 2022-01-04 MED ORDER — COLCHICINE 0.6 MG PO TABS: 0.6000 mg | ORAL_TABLET | Freq: Every day | ORAL | 0 refills | Status: DC

## 2022-01-04 NOTE — Progress Notes (Signed)
? ?Subjective:  ? ? Patient ID: Latoya Rios, female    DOB: 02-12-1956, 66 y.o.   MRN: [id number] ? ?HPI ?Pt had rt ankle and foot pain on last visit. Pt had uric acid level 11.5 range. Pt started prednisone and after one dose significant improvement. By one week no pain. Pt states dx skin infection in past but never had gout dx before. ? ?I had advised pt to stop keflex and start doxycyline pending lab results.  ? ? ?Htn- pt is on losartan 50 mg daily, coreg  6.25 mg bid and maxizide. She took meds at 9 am.  ? ? ? ?Review of Systems  ?Constitutional:  Negative for chills, fatigue and fever.  ?Respiratory:  Negative for cough, chest tightness and shortness of breath.   ?Cardiovascular:  Negative for chest pain and palpitations.  ?Gastrointestinal:  Negative for abdominal pain.  ?Musculoskeletal:  Negative for back pain and gait problem.  ?Hematological:  Negative for adenopathy. Does not bruise/bleed easily.  ?Psychiatric/Behavioral:  Negative for behavioral problems and confusion. The patient is not nervous/anxious and is not hyperactive.   ? ? ?Past Medical History:  ?Diagnosis Date  ? Abnormal renal function 03/01/2019  ? Formatting of this note is different from the original. eGFR  >=60.0 mL/min/1.44m2 >60.0  31.2Low  CM  >60.0 CM     Last Assessment & Plan:  Formatting of this note might be different from the original. Resolved as of last labs Rechecking labs to ensure stability  ? Accelerated hypertension 06/08/2020  ? Acute cystitis without hematuria 06/24/2020  ? Acute hypoxemic respiratory failure due to COVID-19 ([facility] 06/08/2020  ? Acute kidney injury ([facility] 06/23/2020  ? AKI (acute kidney injury) ([facility] 06/24/2020  ? ARF (acute renal failure) ([facility] 06/24/2020  ? Benign essential hypertension 05/29/2016  ? Last Assessment & Plan:  Formatting of this note is different from the original. Pertinent Data:   Current medication includes: carvediloL - 3.125 mg losartan-hydroCHLOROthiazide - 100-25 mg .  BP Readings from  Last 3 Encounters:  02/29/20 (!) 206/86  08/31/19 (!) 142/78  08/16/19 (!) 160/56   LDL Calculated (mg/dL)  Date Value  08/31/2019 144 (H)  03/01/2019 156 (H)   Creatinine (mg/dL)  Date Val  ? CHF (congestive heart failure) ([facility]   ? Class 2 severe obesity due to excess calories with serious comorbidity and body mass index (BMI) of 37.0 to 37.9 in adult ([facility] 05/29/2016  ? Formatting of this note is different from the original. Wt Readings from Last 3 Encounters:  08/31/19 233 lb  08/16/19 239 lb  03/01/19 249 lb    Last Assessment & Plan:  Formatting of this note might be different from the original. BMI Assessment: Current Body mass index is 37.22 kg/m?.Marland Kitchen Patient BMI currently is above average (>25 kg/m2); BMI follow up plan is completed . Current barriers to heal  ? Demand ischemia ([place] 06/08/2020  ? Drug declined by patient 02/29/2020  ? Last Assessment & Plan:  Formatting of this note might be different from the original. Patient historically has declined statins and at some point was on losartan/HCTZ and carvedilol, but stopped taking them stating " I dont want to take medications ".  Today I again discussed with patient the risks of uncontrolled HYPERLIPIDEMIA and HYPERTENSION including but not limited to strokes, MIs, renal in  ? Encounter for preventative adult health care exam with abnormal findings 06/12/2017  ? Last Assessment & Plan:  Formatting of this note might be different  from the original. Patient for wellness visit.  Overall doing good. No new concerns. Reviewed appropriate age screenings and vaccinations.  Influenza vaccine: Patient Declined Tdap/TD: Patient Declined Zoster vaccine: Patient Declined  Colonoscopy: UTD, due 2022 Mammogram: UTD  Depression screening: negative No falls reported  Dis  ? Gynecologic exam normal 08/16/2019  ? Last Assessment & Plan:  Formatting of this note might be different from the original. Pap smear with high risk hpv collected for screening Discussed and encouraged  healthy lifestyle.  Always use sunscreen when sun exposed.  Encouraged healthy diet with fresh fruits, vegetables, and lean meat.  Encouraged regular exercise for heart health and bone health.  Encouraged continue monthly SBE and yearl  ? Heart murmur   ? Hypertension   ? Hypotension due to drugs 06/22/2020  ? Mixed hyperlipidemia   ? Obese   ? Obesity (BMI 30-39.9) 06/08/2020  ? Pneumonia due to COVID-19 virus 06/08/2020  ? ?  ?Social History  ? ?Socioeconomic History  ? Marital status: Widowed  ?  Spouse name: Not on file  ? Number of children: Not on file  ? Years of education: Not on file  ? Highest education level: Not on file  ?Occupational History  ? Not on file  ?Tobacco Use  ? Smoking status: Never  ? Smokeless tobacco: Never  ?Substance and Sexual Activity  ? Alcohol use: Never  ? Drug use: Never  ? Sexual activity: Not Currently  ?Other Topics Concern  ? Not on file  ?Social History Narrative  ? Not on file  ? ?Social Determinants of Health  ? ?Financial Resource Strain: Not on file  ?Food Insecurity: Not on file  ?Transportation Needs: Not on file  ?Physical Activity: Not on file  ?Stress: Not on file  ?Social Connections: Not on file  ?Intimate Partner Violence: Not on file  ? ? ?Past Surgical History:  ?Procedure Laterality Date  ? COLONOSCOPY    ? DENTAL SURGERY    ? ? ?Family History  ?Problem Relation Age of Onset  ? Emphysema Mother   ? Lung disease Mother   ? Brain cancer Father   ? ? ?Allergies  ?Allergen Reactions  ? Clonidine Other (See Comments)  ?  unk  ? Nisoldipine Other (See Comments)  ?  unk  ? Penicillins Other (See Comments)  ?  welps  ? Quinacrine Other (See Comments)  ?  unk  ? Quinapril Other (See Comments)  ?  unk  ? ? ?Current Outpatient Medications on File Prior to Visit  ?Medication Sig Dispense Refill  ? aspirin EC 81 MG EC tablet Take 1 tablet (81 mg total) by mouth daily. Swallow whole. 30 tablet 11  ? carvedilol (COREG) 6.25 MG tablet Take 1 tablet (6.25 mg total) by mouth 2  (two) times daily with a meal. 180 tablet 1  ? Cholecalciferol (VITAMIN D3) 10 MCG (400 UNIT) CAPS Take by mouth in the morning and at bedtime.    ? doxycycline (VIBRA-TABS) 100 MG tablet Take 1 tablet (100 mg total) by mouth 2 (two) times daily. 20 tablet 0  ? losartan (COZAAR) 50 MG tablet TAKE 2 TABLETS BY MOUTH EVERY DAY 180 tablet 1  ? nitroGLYCERIN (NITROSTAT) 0.4 MG SL tablet Place 1 tablet (0.4 mg total) under the tongue every 5 (five) minutes as needed. 75 tablet 1  ? predniSONE (DELTASONE) 10 MG tablet Taper over 6 days, Take 6 tablets on day 1, 5 tabs on day 2, 4 tabs on day 3,  3 tabs on day 4, 2 tabs on day 5, then 1 tab on day 6. 21 tablet 0  ? triamterene-hydrochlorothiazide (MAXZIDE-25) 37.5-25 MG tablet TAKE 1 TABLET BY MOUTH DAILY. DISCONTINUE CHLORTHALIDONE 90 tablet 1  ? ?No current facility-administered medications on file prior to visit.  ? ? ?BP 130/68 Comment: espac.  Pulse (!) 58   Temp 98.2 ?F (36.8 ?C)   Resp 18   Ht '5\' 1"'  (1.549 m)   Wt 216 lb 9.6 oz (98.2 kg)   SpO2 94%   BMI 40.93 kg/m?  ?  ?   ?Objective:  ? Physical Exam ? ?General- No acute distress. Pleasant patient. ?Neck- Full range of motion, no jvd ?Lungs- Clear, even and unlabored. ?Heart- regular rate and rhythm. ?Neurologic- CNII- XII grossly intact.  ? ?Right lower extremity-right ankle and foot not swollen or tender.  Normal range of motion. ? ? ?   ?Assessment & Plan:  ? ?Patient Instructions  ?Recent right foot/ankle gout flare with uric acid level 11.2.  Responded quickly to tapered prednisone.  Presently decided to prescribe colchicine 0.6 mg 1 tablet daily dose.  Discussed dosing with pharmacist and with potential interaction with carvedilol recommended conservative dosing.  We will get metabolic panel today.  Follow low uric acid diet. ? ?Hypertension-blood pressure much better controlled on recheck.  Continue losartan 50 mg daily, Coreg 6.5 mg twice daily and Maxide.  Need to potentially consider changing  regimen/dropping HCTZ as this could increase uric acid but will do that presently due to initial very high blood pressure when we first checked it. ? ? ?Follow-up in 1 month or sooner if needed.  ?

## 2022-01-04 NOTE — Patient Instructions (Addendum)
Recent right foot/ankle gout flare with uric acid level 11.2.  Responded quickly to tapered prednisone.  Presently decided to prescribe colchicine 0.6 mg 1 tablet daily dose.  Discussed dosing with pharmacist and with potential interaction with carvedilol recommended conservative dosing.  We will get metabolic panel today.  Follow low uric acid diet. ? ?Hypertension-blood pressure much better controlled on recheck.  Continue losartan 50 mg daily, Coreg 6.5 mg twice daily and Maxide.  Need to potentially consider changing regimen/dropping HCTZ as this could increase uric acid but will do that presently due to initial very high blood pressure when we first checked it. ? ? ?Follow-up in 1 month or sooner if needed. ? ?Low-Purine Eating Plan ?A low-purine eating plan involves making food choices to limit your purine intake. Purine is a kind of uric acid. Too much uric acid in your blood can cause certain conditions, such as gout and kidney stones. Eating a low-purine diet may help control these conditions. ?What are tips for following this plan? ?Shopping ?Avoid buying products that contain high-fructose corn syrup. Check for this on food labels. It is commonly found in many processed foods and soft drinks. Be sure to check for it in baked goods such as cookies, canned fruits, and cereals and cereal bars. ?Avoid buying veal, chicken breast with skin, lamb, and organ meats such as liver. These types of meats tend to have the highest purine content. ?Choose dairy products. These may lower uric acid levels. ?Avoid certain types of fish. Not all fish and seafood have high purine content. Examples with high purine content include anchovies, trout, tuna, sardines, and salmon. ?Avoid buying beverages that contain alcohol, particularly beer and hard liquor. Alcohol can affect the way your body gets rid of uric acid. ?Meal planning ? ?Learn which foods do or do not affect you. If you find out that a food tends to cause your gout  symptoms to flare up, avoid eating that food. You can enjoy foods that do not cause problems. If you have any questions about a food item, talk with your dietitian or health care provider. ?Reduce the overall amount of meat in your diet. When you do eat meat, choose ones with lower purine content. ?Include plenty of fruits and vegetables. Although some vegetables may have a high purine content--such as asparagus, mushrooms, spinach, or cauliflower--it has been shown that these do not contribute to uric acid blood levels as much. ?Consume at least 1 dairy serving a day. This has been shown to decrease uric acid levels. ?General information ?If you drink alcohol: ?Limit how much you have to: ?0-1 drink a day for women who are not pregnant. ?0-2 drinks a day for men. ?Know how much alcohol is in a drink. In the [place] one drink equals one 12 oz bottle of beer (355 mL), one 5 oz glass of wine (148 mL), or one 1? oz glass of hard liquor (44 mL). ?Drink plenty of water. Try to drink enough to keep your urine pale yellow. Fluids can help remove uric acid from your body. ?Work with your health care provider and dietitian to develop a plan to achieve or maintain a healthy weight. Losing weight may help reduce uric acid in your blood. ?What foods are recommended? ?The following are some types of foods that are good choices when limiting purine intake: ?Fresh or frozen fruits and vegetables. ?Whole grains, breads, cereals, and pasta. Rice. ?Beans, peas, legumes. ?Nuts and seeds. ?Dairy products. ?Fats and oils. ?The items listed  above may not be a complete list. Talk with a dietitian about what dietary choices are best for you. ?What foods are not recommended? ?Limit your intake of foods high in purines, including: ?Beer and other alcohol. ?Meat-based gravy or sauce. ?Canned or fresh fish, such as: ?Anchovies, sardines, herring, salmon, and tuna. ?Mussels and scallops. ?Codfish, trout, and haddock. ?Bacon, veal, chicken breast  with skin, and lamb. ?Organ meats, such as: ?Liver or kidney. ?[place]. ?Sweetbreads (thymus gland or pancreas). ?[employer] or goose. ?Yeast or yeast extract supplements. ?Drinks sweetened with high-fructose corn syrup, such as soda. ?Processed foods made with high-fructose corn syrup. ?The items listed above may not be a complete list of foods and beverages you should limit. Contact a dietitian for more information. ?Summary ?Eating a low-purine diet may help control conditions caused by too much uric acid in the body, such as gout or kidney stones. ?Choose low-purine foods, limit alcohol, and limit high-fructose corn syrup. ?You will learn over time which foods do or do not affect you. If you find out that a food tends to cause your gout symptoms to flare up, avoid eating that food. ?This information is not intended to replace advice given to you by your health care provider. Make sure you discuss any questions you have with your health care provider. ?Document Revised: 08/16/2021 Document Reviewed: 08/16/2021 ?Elsevier Patient Education ? 2023 Elsevier Inc. ? ?

## 2022-01-05 NOTE — Addendum Note (Signed)
Addended by: Anabel Halon on: 01/05/2022 09:39 AM ? ? Modules accepted: Orders ? ?

## 2022-01-10 ENCOUNTER — Other Ambulatory Visit (HOSPITAL_BASED_OUTPATIENT_CLINIC_OR_DEPARTMENT_OTHER): Payer: Self-pay | Admitting: Medical

## 2022-01-10 DIAGNOSIS — Z1231 Encounter for screening mammogram for malignant neoplasm of breast: Secondary | ICD-10-CM

## 2022-01-18 ENCOUNTER — Other Ambulatory Visit (INDEPENDENT_AMBULATORY_CARE_PROVIDER_SITE_OTHER): Payer: Medicare PPO

## 2022-01-18 DIAGNOSIS — I10 Essential (primary) hypertension: Secondary | ICD-10-CM

## 2022-01-18 DIAGNOSIS — R944 Abnormal results of kidney function studies: Secondary | ICD-10-CM | POA: Diagnosis not present

## 2022-01-18 LAB — COMPREHENSIVE METABOLIC PANEL
ALT: 18 U/L (ref 0–35)
AST: 18 U/L (ref 0–37)
Albumin: 4 g/dL (ref 3.5–5.2)
Alkaline Phosphatase: 58 U/L (ref 39–117)
BUN: 33 mg/dL — ABNORMAL HIGH (ref 6–23)
CO2: 29 mEq/L (ref 19–32)
Calcium: 9.5 mg/dL (ref 8.4–10.5)
Chloride: 101 mEq/L (ref 96–112)
Creatinine, Ser: 1.04 mg/dL (ref 0.40–1.20)
GFR: 56.27 mL/min — ABNORMAL LOW (ref >60.00)
Glucose, Bld: 82 mg/dL (ref 70–99)
Potassium: 4.3 mEq/L (ref 3.5–5.1)
Sodium: 138 mEq/L (ref 135–145)
Total Bilirubin: 0.5 mg/dL (ref 0.2–1.2)
Total Protein: 6.7 g/dL (ref 6.0–8.3)

## 2022-01-28 DIAGNOSIS — H40013 Open angle with borderline findings, low risk, bilateral: Secondary | ICD-10-CM | POA: Diagnosis not present

## 2022-02-04 ENCOUNTER — Encounter: Payer: Self-pay | Admitting: Medical

## 2022-02-04 ENCOUNTER — Ambulatory Visit: Payer: Medicare PPO | Admitting: Medical

## 2022-02-04 VITALS — BP 160/85 | HR 62 | Resp 18 | Ht 61.0 in | Wt 210.4 lb

## 2022-02-04 DIAGNOSIS — M1000 Idiopathic gout, unspecified site: Secondary | ICD-10-CM | POA: Diagnosis not present

## 2022-02-04 DIAGNOSIS — R739 Hyperglycemia, unspecified: Secondary | ICD-10-CM

## 2022-02-04 DIAGNOSIS — I10 Essential (primary) hypertension: Secondary | ICD-10-CM | POA: Diagnosis not present

## 2022-02-04 DIAGNOSIS — R944 Abnormal results of kidney function studies: Secondary | ICD-10-CM | POA: Diagnosis not present

## 2022-02-04 DIAGNOSIS — M79601 Pain in right arm: Secondary | ICD-10-CM | POA: Diagnosis not present

## 2022-02-04 LAB — COMPREHENSIVE METABOLIC PANEL
ALT: 22 U/L (ref 0–35)
AST: 20 U/L (ref 0–37)
Albumin: 4.4 g/dL (ref 3.5–5.2)
Alkaline Phosphatase: 56 U/L (ref 39–117)
BUN: 58 mg/dL — ABNORMAL HIGH (ref 6–23)
CO2: 26 mEq/L (ref 19–32)
Calcium: 9.8 mg/dL (ref 8.4–10.5)
Chloride: 103 mEq/L (ref 96–112)
Creatinine, Ser: 1.65 mg/dL — ABNORMAL HIGH (ref 0.40–1.20)
GFR: 32.33 mL/min — ABNORMAL LOW (ref >60.00)
Glucose, Bld: 86 mg/dL (ref 70–99)
Potassium: 4.2 mEq/L (ref 3.5–5.1)
Sodium: 137 mEq/L (ref 135–145)
Total Bilirubin: 0.8 mg/dL (ref 0.2–1.2)
Total Protein: 7.2 g/dL (ref 6.0–8.3)

## 2022-02-04 LAB — URIC ACID: Uric Acid, Serum: 11.7 mg/dL — ABNORMAL HIGH (ref 2.4–7.0)

## 2022-02-04 LAB — HEMOGLOBIN A1C: Hgb A1c MFr Bld: 5.5 % (ref 4.6–6.5)

## 2022-02-04 MED ORDER — HYDRALAZINE HCL 25 MG PO TABS: 25.0000 mg | ORAL_TABLET | Freq: Three times a day (TID) | ORAL | 0 refills | Status: DC

## 2022-02-04 NOTE — Addendum Note (Signed)
Addended by: Gwenevere Abbot on: 02/04/2022 08:20 PM   Modules accepted: Orders

## 2022-02-04 NOTE — Patient Instructions (Addendum)
Htn- you blood pressure is high today in office. Would ask your daughter check bp later today. Ask your daughter update me on your bp readings. If above 140/90 would have you add on hydralazine 25 mg three time daily to current regimen.(Clonidine, maxzide, losartan and coreg).  Decreased gfr. Will check your cmp today. Hopefully number better. If not would refer to nephrologist.  Elevated sugar in past. Get a1c today.  Pain intermittent rt upper ex bicep tendon area. Pain mild and intermittent on range of motion. Offer referral to sports medicine to evaluate area/bicep tendon. Let me know if you want the referral.   Acute gout now resolved. Will hold off on further meds for prevention pending labs.   Follow up in with me March 11, 2022 or sooner if needed.  Would ask that if amlodipine started that you send me bp update in one week from today.   Avoid any ibuprofen, alleve or tylenol presently. Will give opinion on what might be used after lab review.

## 2022-02-04 NOTE — Progress Notes (Signed)
Subjective:    Patient ID: Latoya Rios, female    DOB: 22-Sep-1955, 66 y.o.   MRN: [id number]  HPI  Pt in for follow up.  Pt had rt foot pain which prompted work up for gout. On past visit   "Recent right foot/ankle gout flare with uric acid level 11.2.  Responded quickly to tapered prednisone.  Presently decided to prescribe colchicine 0.6 mg 1 tablet daily dose.  Discussed dosing with pharmacist and with potential interaction with carvedilol recommended conservative dosing.  We will get metabolic panel today.  Follow low uric acid diet."  Since taking both prednisone and then started colchicine her pain has resolve.Just ran out of colchicine yesterday.  Pt states she is well hydrated today.   Also on review pt has low gfr in 01-04-22 and then again on 5/5-23. 2021 gfr was in 90 rang.  Pt has htn. She is losartan, maxzide and coreg. Bp initially high today. Pt has not been checking her bp. Pt seeing her cardiologist February 28, 2022.  Elevated sugar in the past.   Rt upper bicep area pain randomly for past 2 weeks. Pain is mild intermittent. No injury or fall. Pain more when pulls back rt upper extreimty or when lifts arm.   Review of Systems  Constitutional:  Negative for chills, fatigue and fever.  Respiratory:  Negative for cough, chest tightness, shortness of breath and wheezing.   Cardiovascular:  Negative for chest pain and palpitations.  Gastrointestinal:  Negative for abdominal pain.  Musculoskeletal:  Negative for back pain.       Rt upper arm pain. See hpi.  Skin:  Negative for rash.    Past Medical History:  Diagnosis Date   Abnormal renal function 03/01/2019   Formatting of this note is different from the original. eGFR  >=60.0 mL/min/1.20m2 >60.0  31.2Low  CM  >60.0 CM     Last Assessment & Plan:  Formatting of this note might be different from the original. Resolved as of last labs Rechecking labs to ensure stability   Accelerated hypertension 06/08/2020   Acute  cystitis without hematuria 06/24/2020   Acute hypoxemic respiratory failure due to COVID-19 ([facility] 06/08/2020   [place] [facility] [place] 06/23/2020   AKI (acute kidney injury) ([facility] 06/24/2020   ARF (acute renal failure) ([facility] 06/24/2020   Benign essential hypertension 05/29/2016   Last Assessment & Plan:  Formatting of this note is different from the original. Pertinent Data:   Current medication includes: carvediloL - 3.125 mg losartan-hydroCHLOROthiazide - 100-25 mg .  BP Readings from Last 3 Encounters:  02/29/20 (!) 206/86  08/31/19 (!) 142/78  08/16/19 (!) 160/56   LDL Calculated (mg/dL)  Date Value  08/31/2019 144 (H)  03/01/2019 156 (H)   Creatinine (mg/dL)  Date Val   CHF (congestive heart failure) ([facility])    Class 2 severe obesity due to excess calories with serious comorbidity and body mass index (BMI) of 37.0 to 37.9 in adult ([facility] 05/29/2016   Formatting of this note is different from the original. Wt Readings from Last 3 Encounters:  08/31/19 233 lb  08/16/19 239 lb  03/01/19 249 lb    Last Assessment & Plan:  Formatting of this note might be different from the original. BMI Assessment: Current Body mass index is 37.22 kg/m.  Patient BMI currently is above average (>25 kg/m2); BMI follow up plan is completed . Current barriers to heal   Demand ischemia ([facility] 06/08/2020   Drug declined by patient  02/29/2020   Last Assessment & Plan:  Formatting of this note might be different from the original. Patient historically has declined statins and at some point was on losartan/HCTZ and carvedilol, but stopped taking them stating " I dont want to take medications ".  Today I again discussed with patient the risks of uncontrolled HYPERLIPIDEMIA and HYPERTENSION including but not limited to strokes, MIs, renal in   Encounter for preventative adult health care exam with abnormal findings 06/12/2017   Last Assessment & Plan:  Formatting of this note might be different from the original. Patient for wellness  visit.  Overall doing good. No new concerns. Reviewed appropriate age screenings and vaccinations.  Influenza vaccine: Patient Declined Tdap/TD: Patient Declined Zoster vaccine: Patient Declined  Colonoscopy: UTD, due 2022 Mammogram: UTD  Depression screening: negative No falls reported  Dis   Gynecologic exam normal 08/16/2019   Last Assessment & Plan:  Formatting of this note might be different from the original. Pap smear with high risk hpv collected for screening Discussed and encouraged healthy lifestyle.  Always use sunscreen when sun exposed.  Encouraged healthy diet with fresh fruits, vegetables, and lean meat.  Encouraged regular exercise for heart health and bone health.  Encouraged continue monthly SBE and yearl   Heart murmur    Hypertension    Hypotension due to drugs 06/22/2020   Mixed hyperlipidemia    Obese    Obesity (BMI 30-39.9) 06/08/2020   Pneumonia due to COVID-19 virus 06/08/2020     Social History   Socioeconomic History   Marital status: Widowed    Spouse name: Not on file   Number of children: Not on file   Years of education: Not on file   Highest education level: Not on file  Occupational History   Not on file  Tobacco Use   Smoking status: Never   Smokeless tobacco: Never  Substance and Sexual Activity   Alcohol use: Never   Drug use: Never   Sexual activity: Not Currently  Other Topics Concern   Not on file  Social History Narrative   Not on file   Social Determinants of Health   Financial Resource Strain: Not on file  Food Insecurity: Not on file  Transportation Needs: Not on file  Physical Activity: Not on file  Stress: Not on file  Social Connections: Not on file  Intimate Partner Violence: Not on file    Past Surgical History:  Procedure Laterality Date   COLONOSCOPY     DENTAL SURGERY      Family History  Problem Relation Age of Onset   Emphysema Mother    Lung disease Mother    Brain cancer Father     Allergies  Allergen  Reactions   Clonidine Other (See Comments)    unk   Nisoldipine Other (See Comments)    unk   Penicillins Other (See Comments)    welps   Quinacrine Other (See Comments)    unk   Quinapril Other (See Comments)    unk    Current Outpatient Medications on File Prior to Visit  Medication Sig Dispense Refill   aspirin EC 81 MG EC tablet Take 1 tablet (81 mg total) by mouth daily. Swallow whole. 30 tablet 11   brimonidine (ALPHAGAN) 0.2 % ophthalmic solution 1 drop 2 (two) times daily.     carvedilol (COREG) 6.25 MG tablet Take 1 tablet (6.25 mg total) by mouth 2 (two) times daily with a meal. 180 tablet 1  Cholecalciferol (VITAMIN D3) 10 MCG (400 UNIT) CAPS Take by mouth in the morning and at bedtime.     colchicine 0.6 MG tablet Take 1 tablet (0.6 mg total) by mouth daily. 30 tablet 0   doxycycline (VIBRA-TABS) 100 MG tablet Take 1 tablet (100 mg total) by mouth 2 (two) times daily. 20 tablet 0   losartan (COZAAR) 50 MG tablet TAKE 2 TABLETS BY MOUTH EVERY DAY 180 tablet 1   nitroGLYCERIN (NITROSTAT) 0.4 MG SL tablet Place 1 tablet (0.4 mg total) under the tongue every 5 (five) minutes as needed. 75 tablet 1   predniSONE (DELTASONE) 10 MG tablet Taper over 6 days, Take 6 tablets on day 1, 5 tabs on day 2, 4 tabs on day 3, 3 tabs on day 4, 2 tabs on day 5, then 1 tab on day 6. 21 tablet 0   triamterene-hydrochlorothiazide (MAXZIDE-25) 37.5-25 MG tablet TAKE 1 TABLET BY MOUTH DAILY. DISCONTINUE CHLORTHALIDONE 90 tablet 1   No current facility-administered medications on file prior to visit.    BP (!) 160/85   Pulse 62   Resp 18   Ht 5' 1" (1.549 m)   Wt 210 lb 6.4 oz (95.4 kg)   SpO2 98%   BMI 39.75 kg/m       Objective:   Physical Exam   General- No acute distress. Pleasant patient. Neck- Full range of motion, no jvd Lungs- Clear, even and unlabored. Heart- regular rate and rhythm. Neurologic- CNII- XII grossly intact.  Rt upper arm-  mild tender to palpation upper arm  bicep tendon area just below shoulder. Mild pain on rom. No redness, now warmth. No induration     Assessment & Plan:   Patient Instructions  Htn- you blood pressure is high today in office. Would ask your daughter check bp later today. Ask your daughter update me on your bp readings. If above 140/90 would have you add on hydralazine 25 mg three time daily to current regimen.(Clonidine, maxzide, losartan and coreg).  Decreased gfr. Will check your cmp today. Hopefully number better. If not would refer to nephrologist.  Elevated sugar in past. Get a1c today.  Pain intermittent rt upper ex bicep tendon area. Pain mild and intermittent on range of motion. Offer referral to sports medicine to evaluate area/bicep tendon. Let me know if you want the referral.   Acute gout now resolved. Will hold off on further meds for prevention pending labs.   Follow up in with me March 11, 2022 or sooner if needed.  Would ask that if amlodipine started that you send me bp update in one week from today.   Avoid any ibuprofen, alleve or tylenol presently. Will give opinion on what might be used after lab review.    Mackie Pai, PA-C   Time spent with patient today was  40 minutes which consisted of chart review, discussing diagnosis, work up, treatment, answering question  and documentation.

## 2022-02-05 ENCOUNTER — Encounter (HOSPITAL_BASED_OUTPATIENT_CLINIC_OR_DEPARTMENT_OTHER): Payer: Self-pay

## 2022-02-05 ENCOUNTER — Ambulatory Visit (HOSPITAL_BASED_OUTPATIENT_CLINIC_OR_DEPARTMENT_OTHER)
Admission: RE | Admit: 2022-02-05 | Discharge: 2022-02-05 | Disposition: A | Discharge status: Home / Self Care | Payer: Medicare PPO | Source: Ambulatory Visit | Attending: Medical | Admitting: Medical

## 2022-02-05 DIAGNOSIS — Z1231 Encounter for screening mammogram for malignant neoplasm of breast: Secondary | ICD-10-CM | POA: Diagnosis not present

## 2022-02-07 ENCOUNTER — Other Ambulatory Visit: Payer: Self-pay | Admitting: Medical

## 2022-02-07 DIAGNOSIS — R928 Other abnormal and inconclusive findings on diagnostic imaging of breast: Secondary | ICD-10-CM

## 2022-02-10 ENCOUNTER — Other Ambulatory Visit: Payer: Self-pay | Admitting: Cardiology

## 2022-02-25 DIAGNOSIS — H40013 Open angle with borderline findings, low risk, bilateral: Secondary | ICD-10-CM | POA: Diagnosis not present

## 2022-02-27 ENCOUNTER — Other Ambulatory Visit: Payer: Self-pay | Admitting: Cardiology

## 2022-02-28 ENCOUNTER — Ambulatory Visit: Payer: Medicare PPO | Admitting: Cardiology

## 2022-02-28 ENCOUNTER — Encounter: Payer: Self-pay | Admitting: Cardiology

## 2022-02-28 VITALS — BP 140/60 | HR 44 | Ht 61.0 in | Wt 207.2 lb

## 2022-02-28 DIAGNOSIS — E669 Obesity, unspecified: Secondary | ICD-10-CM

## 2022-02-28 DIAGNOSIS — E782 Mixed hyperlipidemia: Secondary | ICD-10-CM | POA: Diagnosis not present

## 2022-02-28 DIAGNOSIS — R001 Bradycardia, unspecified: Secondary | ICD-10-CM | POA: Diagnosis not present

## 2022-02-28 DIAGNOSIS — I10 Essential (primary) hypertension: Secondary | ICD-10-CM | POA: Diagnosis not present

## 2022-02-28 DIAGNOSIS — M79601 Pain in right arm: Secondary | ICD-10-CM

## 2022-02-28 MED ORDER — CARVEDILOL 3.125 MG PO TABS: 3.1250 mg | ORAL_TABLET | Freq: Two times a day (BID) | ORAL | 3 refills | Status: DC

## 2022-02-28 MED ORDER — LIDOCAINE 4 % EX PTCH: 1.0000 | MEDICATED_PATCH | CUTANEOUS | 0 refills | Status: DC

## 2022-02-28 NOTE — Patient Instructions (Addendum)
Medication Instructions:  Your physician has recommended you make the following change in your medication:  DECREASE: Coreg 3.125 mg twice daily Lidocaine patches 1 every 24 hours.  *If you need a refill on your cardiac medications before your next appointment, please call your pharmacy*   Lab Work: None If you have labs (blood work) drawn today and your tests are completely normal, you will receive your results only by: MyChart Message (if you have MyChart) OR A paper copy in the mail If you have any lab test that is abnormal or we need to change your treatment, we will call you to review the results.   Testing/Procedures: None   Follow-Up: At [facility], you and your health needs are our priority.  As part of our continuing mission to provide you with exceptional heart care, we have created designated Provider Care Teams.  These Care Teams include your primary Cardiologist (physician) and Advanced Practice Providers (APPs -  Physician Assistants and Nurse Practitioners) who all work together to provide you with the care you need, when you need it.  We recommend signing up for the patient portal called "MyChart".  Sign up information is provided on this After Visit Summary.  MyChart is used to connect with patients for Virtual Visits (Telemedicine).  Patients are able to view lab/test results, encounter notes, upcoming appointments, etc.  Non-urgent messages can be sent to your provider as well.   To learn more about what you can do with MyChart, go to [email or web address].    Your next appointment:   6 month(s)  The format for your next appointment:   In Person  Provider:   Thomasene Ripple, DO     Other Instructions   Important Information About Sugar

## 2022-02-28 NOTE — Progress Notes (Signed)
Cardiology Office Note:    Date:  02/28/2022   ID:  Latoya Rios, DOB 1956/05/31, MRN [id number]  PCP:  Mackie Pai, PA-C  Cardiologist:  Berniece Salines, DO  Electrophysiologist:  None   Referring MD: Mackie Pai, PA-C   " I am ok, but I have been experiencing shoulder pain"  History of Present Illness:    Latoya Rios is a 66 y.o. female with a hx of  hypertension here today for follow-up visit.I saw the patient in October 2021 at that time she was hypotensive therefore I decreased her hydralazine and stopped the Aldactone.    Her visit on July 21, 2020 the patient reported that she had been experiencing some dizziness and was concerned about this.  I placed a monitor on the patient and also had blood pressure cuff mailed to the patient and encouraged her to take her blood pressure sitting and standing.   I last saw the patient on August 14, 2020 at that time her blood pressure was elevated I increased her carvedilol to 6.25 mg twice a day.  We talked about her monitor results which show occasional PACs, PVCs as well as paroxysmal atrial tachycardia.  She also had questions about exercise as and we discussed this.   I last saw the patient via video visit on September 06, 2020 at that time she appeared to be doing well.  No changes were made in her medications.   I saw the patient December 05, 2019.  She reported continued chest discomfort.  We will schedule coronary CTA.-Patient was able to get a coronary CTA which did not show any evidence of coronary artery disease.  I saw the patient on 06/18/2021 at that time she was doing wel, she had slight bp elevation in the office but I reviewed her home bp which were within normal.   Today she tells me that she has been doing well but for several week she has had shoulder and arm pain and feels as if her muscles were pulling. She had seen her pcp.   Past Medical History:  Diagnosis Date   Abnormal renal function 03/01/2019   Formatting  of this note is different from the original. eGFR  >=60.0 mL/min/1.37m2 >60.0  31.2Low  CM  >60.0 CM     Last Assessment & Plan:  Formatting of this note might be different from the original. Resolved as of last labs Rechecking labs to ensure stability   Accelerated hypertension 06/08/2020   Acute cystitis without hematuria 06/24/2020   Acute hypoxemic respiratory failure due to COVID-19 ([facility] 06/08/2020   Acute kidney injury ([facility] 06/23/2020   AK[place] ([place] kidney [place]) [place] 06/24/2020   [place] 06/24/[place]   Benign essential hypertension 05/29/2016   Last Assessment & Plan:  Formatting of this note is different from the original. Pertinent Data:   Current medication includes: carvediloL - 3.125 mg losartan-hydroCHLOROthiazide - 100-25 mg .  BP Readings from Last 3 Encounters:  02/29/20 (!) 206/86  08/31/19 (!) 142/78  08/16/19 (!) 160/56   LDL Calculated (mg/dL)  Date Value  08/31/2019 144 (H)  03/01/2019 156 (H)   Creatinine (mg/dL)  Date Val   CHF (congestive heart failure) (HCC)    Class 2 severe obesity due to excess calories with serious comorbidity and body mass index (BMI) of 37.0 to 37.9 in adult ([facility] 05/29/2016   Formatting of this note is different from the original. Wt Readings from Last 3 Encounters:  08/31/19 233 lb  08/16/19 239 lb  03/01/19 249 lb    Last Assessment & Plan:  Formatting of this note might be different from the original. BMI Assessment: Current Body mass index is 37.22 kg/m.  Patient BMI currently is above average (>25 kg/m2); BMI follow up plan is completed . Current barriers to heal   Demand ischemia ([place]) 06/08/2020   Drug declined by patient 02/29/2020   Last Assessment & Plan:  Formatting of this note might be different from the original. Patient historically has declined statins and at some point was on losartan/HCTZ and carvedilol, but stopped taking them stating " I dont want to take medications ".  Today I again discussed with patient the risks  of uncontrolled HYPERLIPIDEMIA and HYPERTENSION including but not limited to strokes, MIs, renal in   Encounter for preventative adult health care exam with abnormal findings 06/12/2017   Last Assessment & Plan:  Formatting of this note might be different from the original. Patient for wellness visit.  Overall doing good. No new concerns. Reviewed appropriate age screenings and vaccinations.  Influenza vaccine: Patient Declined Tdap/TD: Patient Declined Zoster vaccine: Patient Declined  Colonoscopy: UTD, due 2022 Mammogram: UTD  Depression screening: negative No falls reported  Dis   Gynecologic exam normal 08/16/2019   Last Assessment & Plan:  Formatting of this note might be different from the original. Pap smear with high risk hpv collected for screening Discussed and encouraged healthy lifestyle.  Always use sunscreen when sun exposed.  Encouraged healthy diet with fresh fruits, vegetables, and lean meat.  Encouraged regular exercise for heart health and bone health.  Encouraged continue monthly SBE and yearl   Heart murmur    Hypertension    Hypotension due to drugs 06/22/2020   Mixed hyperlipidemia    Obese    Obesity (BMI 30-39.9) 06/08/2020   Pneumonia due to COVID-19 virus 06/08/2020    Past Surgical History:  Procedure Laterality Date   COLONOSCOPY     DENTAL SURGERY      Current Medications: Current Meds  Medication Sig   aspirin EC 81 MG EC tablet Take 1 tablet (81 mg total) by mouth daily. Swallow whole.   brimonidine (ALPHAGAN) 0.2 % ophthalmic solution 1 drop 2 (two) times daily.   carvedilol (COREG) 3.125 MG tablet Take 1 tablet (3.125 mg total) by mouth 2 (two) times daily.   Cholecalciferol (VITAMIN D3) 10 MCG (400 UNIT) CAPS Take by mouth in the morning and at bedtime.   lidocaine 4 % Place 1 patch onto the skin daily.   losartan (COZAAR) 50 MG tablet TAKE 2 TABLETS BY MOUTH EVERY DAY   nitroGLYCERIN (NITROSTAT) 0.4 MG SL tablet PLACE 1 TABLET UNDER THE TONGUE EVERY 5  MINUTES AS NEEDED NOT TO EXCEED 3 IN 15 MINUTES   triamterene-hydrochlorothiazide (MAXZIDE-25) 37.5-25 MG tablet TAKE 1 TABLET BY MOUTH DAILY. DISCONTINUE CHLORTHALIDONE   [DISCONTINUED] carvedilol (COREG) 6.25 MG tablet TAKE 1 TABLET(6.25 MG) BY MOUTH TWICE DAILY WITH A MEAL     Allergies:   Clonidine, Nisoldipine, Penicillins, Quinacrine, and Quinapril   Social History   Socioeconomic History   Marital status: Widowed    Spouse name: Not on file   Number of children: Not on file   Years of education: Not on file   Highest education level: Not on file  Occupational History   Not on file  Tobacco Use   Smoking status: Never   Smokeless tobacco: Never  Substance and Sexual Activity   Alcohol use: Never  Drug use: Never   Sexual activity: Not Currently  Other Topics Concern   Not on file  Social History Narrative   Not on file   Social Determinants of Health   Financial Resource Strain: Not on file  Food Insecurity: Not on file  Transportation Needs: Not on file  Physical Activity: Not on file  Stress: Not on file  Social Connections: Not on file     Family History: The patient's family history includes Brain cancer in her father; Emphysema in her mother; Lung disease in her mother.  ROS:   Review of Systems  Constitution: Negative for decreased appetite, fever and weight gain.  HENT: Negative for congestion, ear discharge, hoarse voice and sore throat.   Eyes: Negative for discharge, redness, vision loss in right eye and visual halos.  Cardiovascular: Negative for chest pain, dyspnea on exertion, leg swelling, orthopnea and palpitations.  Respiratory: Negative for cough, hemoptysis, shortness of breath and snoring.   Endocrine: Negative for heat intolerance and polyphagia.  Hematologic/Lymphatic: Negative for bleeding problem. Does not bruise/bleed easily.  Skin: Negative for flushing, nail changes, rash and suspicious lesions.  Musculoskeletal: Negative for  arthritis, joint pain, muscle cramps, myalgias, neck pain and stiffness.  Gastrointestinal: Negative for abdominal pain, bowel incontinence, diarrhea and excessive appetite.  Genitourinary: Negative for decreased libido, genital sores and incomplete emptying.  Neurological: Negative for brief paralysis, focal weakness, headaches and loss of balance.  Psychiatric/Behavioral: Negative for altered mental status, depression and suicidal ideas.  Allergic/Immunologic: Negative for HIV exposure and persistent infections.    EKGs/Labs/Other Studies Reviewed:    The following studies were reviewed today:   EKG:  The ekg ordered today demonstrates sinus bradycardia with arrhythmia and ventricular escape   TTE 05/2020 IMPRESSIONS     1. Left ventricular ejection fraction, by estimation, is 60 to 65%. The  left ventricle has normal function. The left ventricle has no regional  wall motion abnormalities. Left ventricular diastolic parameters are  consistent with Grade I diastolic  dysfunction (impaired relaxation).   2. Right ventricular systolic function is normal. The right ventricular  size is normal. Tricuspid regurgitation signal is inadequate for assessing  PA pressure.   3. The mitral valve is grossly normal. Mild mitral valve regurgitation.  No evidence of mitral stenosis.   4. The aortic valve is grossly normal. Aortic valve regurgitation is not  visualized. No aortic stenosis is present.   5. The inferior vena cava is normal in size with <50% respiratory  variability, suggesting right atrial pressure of 8 mmHg.   FINDINGS   Left Ventricle: Left ventricular ejection fraction, by estimation, is 60  to 65%. The left ventricle has normal function. The left ventricle has no  regional wall motion abnormalities. The left ventricular internal cavity  size was normal in size. There is   no left ventricular hypertrophy. Left ventricular diastolic parameters  are consistent with Grade I  diastolic dysfunction (impaired relaxation).   Right Ventricle: The right ventricular size is normal. No increase in  right ventricular wall thickness. Right ventricular systolic function is  normal. Tricuspid regurgitation signal is inadequate for assessing PA  pressure.   Left Atrium: Left atrial size was normal in size.   Right Atrium: Right atrial size was normal in size.   Pericardium: Trivial pericardial effusion is present. Presence of  pericardial fat pad.   Mitral Valve: The mitral valve is grossly normal. Mild mitral valve  regurgitation. No evidence of mitral valve stenosis.   Tricuspid  Valve: The tricuspid valve is grossly normal. Tricuspid valve  regurgitation is trivial. No evidence of tricuspid stenosis.   Aortic Valve: The aortic valve is grossly normal. Aortic valve  regurgitation is not visualized. No aortic stenosis is present.   Pulmonic Valve: The pulmonic valve was grossly normal. Pulmonic valve  regurgitation is not visualized. No evidence of pulmonic stenosis.   Aorta: The aortic root is normal in size and structure.   Venous: The inferior vena cava is normal in size with less than 50%  respiratory variability, suggesting right atrial pressure of 8 mmHg.   IAS/Shunts: The atrial septum is grossly normal.        CCTA 03/12/2021 Aorta:  Normal size.  No calcifications.  No dissection.   Aortic Valve:  Trileaflet.  No calcifications.   Coronary Arteries:  Normal coronary origin.  Right dominance.   RCA is a large dominant artery that gives rise to PDA and PLA. There is no plaque.   Left main is a large artery that gives rise to LAD, Ramus intermedius and LCX arteries.   LAD is a large vessel that has no plaque.   Ramus with no plaques.   LCX is a non-dominant artery that gives rise to one large OM1 branch. There is no plaque.   Other findings:   Normal variant pulmonary vein (including right middle) all draining into the left atrium.    Normal left atrial appendage without a thrombus.   Normal size of the pulmonary artery.   IMPRESSION: 1. Coronary calcium score of 0. This was 0 percentile for age and sex matched control.   2. Normal coronary origin with right dominance.   3. No evidence of CAD. CAD-RADS 0. No evidence of CAD (0%). Consider non-atherosclerotic causes of chest pain.     Electronically Signed   By: Berniece Salines DO   On: 03/12/2021 17:07    Addended by Berniece Salines, DO on 03/12/2021  5:09 PM    Study Result   Narrative & Impression  EXAM: OVER-READ INTERPRETATION  CT CHEST   The following report is an over-read performed by radiologist Dr. Aletta Edouard of [facility], [place] on 03/12/2021. This over-read does not include interpretation of cardiac or coronary anatomy or pathology. The coronary CTA interpretation by the cardiologist is attached.   COMPARISON:  None.   FINDINGS: Vascular: No significant noncardiac vascular findings.   Mediastinum/Nodes: Visualized mediastinum and hilar regions demonstrate no lymphadenopathy or masses.   Lungs/Pleura: Visualized lungs show no evidence of pulmonary edema, consolidation, pneumothorax, nodule or pleural fluid.   Upper Abdomen: No acute abnormality.   Musculoskeletal: No chest wall mass or suspicious bone lesions identified.   IMPRESSION: No significant incidental findings.   Electronically Signed: By: Aletta Edouard M.D. On: 03/12/2021 16:06    Recent Labs: 11/28/2021: Hemoglobin 12.6; Platelets 296.0 02/04/2022: ALT 22; BUN 58; Creatinine, Ser 1.65; Potassium 4.2; Sodium 137  Recent Lipid Panel    Component Value Date/Time   TRIG 84 06/08/2020 0225    Physical Exam:    VS:  BP 140/60 (BP Location: Left Arm)   Pulse (!) 44   Ht '5\' 1"'  (1.549 m)   Wt 207 lb 3.2 oz (94 kg)   BMI 39.15 kg/m     Wt Readings from Last 3 Encounters:  02/28/22 207 lb 3.2 oz (94 kg)  02/04/22 210 lb 6.4 oz (95.4 kg)  01/04/22 216 lb  9.6 oz (98.2 kg)     GEN: Well nourished, well  developed in no acute distress HEENT: Normal NECK: No JVD; No carotid bruits LYMPHATICS: No lymphadenopathy CARDIAC: S1S2 noted,RRR, no murmurs, rubs, gallops RESPIRATORY:  Clear to auscultation without rales, wheezing or rhonchi  ABDOMEN: Soft, non-tender, non-distended, +bowel sounds, no guarding. EXTREMITIES: No edema, No cyanosis, no clubbing MUSCULOSKELETAL:  No deformity  SKIN: Warm and dry NEUROLOGIC:  Alert and oriented x 3, non-focal PSYCHIATRIC:  Normal affect, good insight  ASSESSMENT:    1. Sinus bradycardia   2. Hypertension, unspecified type   3. Obesity (BMI 30-39.9)   4. Mixed hyperlipidemia   5. Musculoskeletal pain of upper extremity, right    PLAN:    HR 44bpm today - asymptomatic. She is coreg 6.37m BID will cut back to 3.1251mBID.  BP is acceptable today, but I asked the patient to take her bp daily give the decrease dose of coreg.   Continue statin.  In terms of her upper extremity muscle pain she prefers not take ibuprofen due to her CKD and is concern about tylenol due to previous elevated liver enzyme We send few patches of lidocaine to the pharmacist.   She wants to stop Aspirin 81 mg daily, no CAD or stroke- I am in agreement with thins,   The patient is in agreement with the above plan. The patient left the office in stable condition.  The patient will follow up in   Medication Adjustments/Labs and Tests Ordered: Current medicines are reviewed at length with the patient today.  Concerns regarding medicines are outlined above.  Orders Placed This Encounter  Procedures   EKG 12-Lead   Meds ordered this encounter  Medications   lidocaine 4 %    Sig: Place 1 patch onto the skin daily.    Dispense:  5 patch    Refill:  0   carvedilol (COREG) 3.125 MG tablet    Sig: Take 1 tablet (3.125 mg total) by mouth 2 (two) times daily.    Dispense:  180 tablet    Refill:  3    Patient Instructions   Medication Instructions:  Your physician has recommended you make the following change in your medication:  DECREASE: Coreg 3.125 mg twice daily Lidocaine patches 1 every 24 hours.  *If you need a refill on your cardiac medications before your next appointment, please call your pharmacy*   Lab Work: None If you have labs (blood work) drawn today and your tests are completely normal, you will receive your results only by: My[place]if you have MyChart) OR A paper copy in the mail If you have any lab test that is abnormal or we need to change your treatment, we will call you to review the results.   Testing/Procedures: None   Follow-Up: [facility]  As part of our continuing mission to provide you with exceptional heart care, we have created designated Provider Care Teams.  These Care Teams include your primary Cardiologist (physician) and Advanced Practice Providers (APPs -  Physician Assistants and Nurse Practitioners) who all work together to provide you with the care you need, when you need it.  We recommend signing up for the patient portal called "MyChart".  Sign up information is provided on this After Visit Summary.  MyChart is used to connect with patients for Virtual Visits (Telemedicine).  Patients are able to view lab/test results, encounter notes, upcoming appointments, etc.  Non-urgent messages can be sent to your provider as well.   To learn  more about what you can do with MyChart, go to [email or web address].    Your next appointment:   6 month(s)  The format for your next appointment:   In Person  Provider:   Berniece Salines, DO     Other Instructions   Important Information About Sugar         Adopting a Healthy Lifestyle.  Know what a healthy weight is for you (roughly BMI <25) and aim to maintain this   Aim for 7+ servings of fruits and vegetables daily   65-80+ fluid ounces of water or  unsweet tea for healthy kidneys   Limit to max 1 drink of alcohol per day; avoid smoking/tobacco   Limit animal fats in diet for cholesterol and heart health - choose grass fed whenever available   Avoid highly processed foods, and foods high in saturated/trans fats   Aim for low stress - take time to unwind and care for your mental health   Aim for 150 min of moderate intensity exercise weekly for heart health, and weights twice weekly for bone health   Aim for 7-9 hours of sleep daily   When it comes to diets, agreement about the perfect plan isnt easy to find, even among the experts. Experts at the [employer] developed an idea known as the Healthy Eating Plate. Just imagine a plate divided into logical, healthy portions.   The emphasis is on diet quality:   Load up on vegetables and fruits - one-half of your plate: Aim for color and variety, and remember that potatoes dont count.   Go for whole grains - one-quarter of your plate: Whole wheat, barley, wheat berries, quinoa, oats, brown rice, and foods made with them. If you want pasta, go with whole wheat pasta.   Protein power - one-quarter of your plate: Fish, chicken, beans, and nuts are all healthy, versatile protein sources. Limit red meat.   The diet, however, does go beyond the plate, offering a few other suggestions.   Use healthy plant oils, such as olive, canola, soy, corn, sunflower and peanut. Check the labels, and avoid partially hydrogenated oil, which have unhealthy trans fats.   If youre thirsty, drink water. Coffee and tea are good in moderation, but skip sugary drinks and limit milk and dairy products to one or two daily servings.   The type of carbohydrate in the diet is more important than the amount. Some sources of carbohydrates, such as vegetables, fruits, whole grains, and beans-are healthier than others.   Finally, stay active  Signed, Berniece Salines, DO  02/28/2022 9:47 PM    [facility]

## 2022-03-01 ENCOUNTER — Ambulatory Visit (INDEPENDENT_AMBULATORY_CARE_PROVIDER_SITE_OTHER): Payer: Medicare PPO | Admitting: Medical

## 2022-03-01 VITALS — BP 131/52 | HR 78 | Resp 18 | Ht 61.0 in | Wt 207.0 lb

## 2022-03-01 DIAGNOSIS — M1000 Idiopathic gout, unspecified site: Secondary | ICD-10-CM | POA: Diagnosis not present

## 2022-03-01 DIAGNOSIS — R001 Bradycardia, unspecified: Secondary | ICD-10-CM | POA: Diagnosis not present

## 2022-03-01 DIAGNOSIS — R739 Hyperglycemia, unspecified: Secondary | ICD-10-CM

## 2022-03-01 DIAGNOSIS — R944 Abnormal results of kidney function studies: Secondary | ICD-10-CM

## 2022-03-01 DIAGNOSIS — I10 Essential (primary) hypertension: Secondary | ICD-10-CM | POA: Diagnosis not present

## 2022-03-01 LAB — COMPREHENSIVE METABOLIC PANEL
ALT: 12 U/L (ref 0–35)
AST: 15 U/L (ref 0–37)
Albumin: 4.1 g/dL (ref 3.5–5.2)
Alkaline Phosphatase: 52 U/L (ref 39–117)
BUN: 52 mg/dL — ABNORMAL HIGH (ref 6–23)
CO2: 25 mEq/L (ref 19–32)
Calcium: 9.5 mg/dL (ref 8.4–10.5)
Chloride: 102 mEq/L (ref 96–112)
Creatinine, Ser: 1.23 mg/dL — ABNORMAL HIGH (ref 0.40–1.20)
GFR: 45.97 mL/min — ABNORMAL LOW (ref >60.00)
Glucose, Bld: 87 mg/dL (ref 70–99)
Potassium: 4.4 mEq/L (ref 3.5–5.1)
Sodium: 138 mEq/L (ref 135–145)
Total Bilirubin: 0.6 mg/dL (ref 0.2–1.2)
Total Protein: 7.1 g/dL (ref 6.0–8.3)

## 2022-03-01 LAB — URIC ACID: Uric Acid, Serum: 12.9 mg/dL — ABNORMAL HIGH (ref 2.4–7.0)

## 2022-03-01 NOTE — Patient Instructions (Addendum)
Htn- you bp reading at cardiologist yesterday was borderline and at home much better. Today bp very high when  CMA first checked. Then came down to 158/85 shen I checked. Your bp readigs at home very well controlled so considering white coat component to in office readings. Continue current bp med regimen as advised by cardiologist. Keep checking bp at home. If bp increasing above 140/90 let your cardiologist know. Update our office as well.  Recent bradcyardia but good pulse when we checked. Follow cardiologist advise on reducing coreg dose.  Gout- recent uric acid level was 11.7. no recent flares.  Decreased gfr and increased cr. Will get cmp today.  After uric acid and cmp review considering referral to rheumatologist.  For elevated sugar but not in diabetic range recently recommend low sugar diet.  Follow up 6-8 weeks or sooner if needed.

## 2022-03-01 NOTE — Addendum Note (Signed)
Addended by: Gwenevere Abbot on: 03/01/2022 05:40 PM   Modules accepted: Orders

## 2022-03-01 NOTE — Progress Notes (Signed)
Subjective:    Patient ID: Latoya Rios, female    DOB: March 28, 1956, 66 y.o.   MRN: [id number]  HPI  Pt in for follow up.   Just recently saw cardiologist. Plan below  in ".   " ASSESSMENT:     1. Sinus bradycardia   2. Hypertension, unspecified type   3. Obesity (BMI 30-39.9)   4. Mixed hyperlipidemia   5. Musculoskeletal pain of upper extremity, right     PLAN:     HR 44bpm today - asymptomatic. She is coreg 6.80m BID will cut back to 3.1290mBID.  BP is acceptable today, but I asked the patient to take her bp daily give the decrease dose of coreg.    Continue statin.   In terms of her upper extremity muscle pain she prefers not take ibuprofen due to her CKD and is concern about tylenol due to previous elevated liver enzyme We send few patches of lidocaine to the pharmacist.    She wants to stop Aspirin 81 mg daily, no CAD or stroke- I am in agreement with thins, "  On recent visit with me labs done. Gfr 32.33 and cr 1.65.  Pt still using diuretic. Cardiologist recently did not DC. I had placed referral to nephrologist end of May.   Gout- last uric acid was 11.7. In light of recent gout flare and kidney function considering referral to rheumatologist and possible uloric.   Elevated sugar- recent a1c not in diabetic range.  Htn- pt bp yesterday 140/60 at cardioloigist  131/52 at home before came in to our office. Also 133/53 before she went to cardiologist.     Review of Systems  Constitutional:  Negative for chills, fatigue and fever.  Respiratory:  Negative for cough, chest tightness and wheezing.   Cardiovascular:  Negative for chest pain and palpitations.  Gastrointestinal:  Negative for abdominal pain, blood in stool, constipation and diarrhea.  Musculoskeletal:  Negative for back pain, joint swelling and neck stiffness.  Skin:  Negative for rash.  Neurological:  Negative for dizziness, syncope and weakness.  Hematological:  Negative for adenopathy.   Psychiatric/Behavioral:  Negative for behavioral problems, confusion and dysphoric mood.    Past Medical History:  Diagnosis Date   Abnormal renal function 03/01/2019   Formatting of this note is different from the original. eGFR  >=60.0 mL/min/1.7344m>60.0  31.2Low  CM  >60.0 CM     Last Assessment & Plan:  Formatting of this note might be different from the original. Resolved as of last labs Rechecking labs to ensure stability   Accelerated hypertension 06/08/2020   Acute cystitis without hematuria 06/24/2020   [facility]   [place]04[place]   [place] [place]) [place]   [facility] failure) [place]   Benign essential hypertension 05/29/2016   Last Assessment & Plan:  Formatting of this note is different from the original. Pertinent Data:   Current medication includes: carvediloL - 3.125 mg losartan-hydroCHLOROthiazide - 100-25 mg .  BP Readings from Last 3 Encounters:  02/29/20 (!) 206/86  08/31/19 (!) 142/78  08/16/19 (!) 160/56   LDL Calculated (mg/dL)  Date Value  08/31/2019 144 (H)  03/01/2019 156 (H)   Creatinine (mg/dL)  Date Val   CHF (congestive heart failure) (HCC)    Class 2 severe obesity due to excess calories with serious comorbidity and body mass index (BMI) of 37.0 to 37.9 in adult ([facility]  05/29/2016   Formatting of this note is different from the original. Wt Readings from Last 3 Encounters:  08/31/19 233 lb  08/16/19 239 lb  03/01/19 249 lb    Last Assessment & Plan:  Formatting of this note might be different from the original. BMI Assessment: Current Body mass index is 37.22 kg/m.  Patient BMI currently is above average (>25 kg/m2); BMI follow up plan is completed . Current barriers to heal   Demand ischemia ([place]) 06/08/2020   Drug declined by patient 02/29/2020   Last Assessment & Plan:  Formatting of this note might be different from the original. Patient historically has  declined statins and at some point was on losartan/HCTZ and carvedilol, but stopped taking them stating " I dont want to take medications ".  Today I again discussed with patient the risks of uncontrolled HYPERLIPIDEMIA and HYPERTENSION including but not limited to strokes, MIs, renal in   Encounter for preventative adult health care exam with abnormal findings 06/12/2017   Last Assessment & Plan:  Formatting of this note might be different from the original. Patient for wellness visit.  Overall doing good. No new concerns. Reviewed appropriate age screenings and vaccinations.  Influenza vaccine: Patient Declined Tdap/TD: Patient Declined Zoster vaccine: Patient Declined  Colonoscopy: UTD, due 2022 Mammogram: UTD  Depression screening: negative No falls reported  Dis   Gynecologic exam normal 08/16/2019   Last Assessment & Plan:  Formatting of this note might be different from the original. Pap smear with high risk hpv collected for screening Discussed and encouraged healthy lifestyle.  Always use sunscreen when sun exposed.  Encouraged healthy diet with fresh fruits, vegetables, and lean meat.  Encouraged regular exercise for heart health and bone health.  Encouraged continue monthly SBE and yearl   Heart murmur    Hypertension    Hypotension due to drugs 06/22/2020   Mixed hyperlipidemia    Obese    Obesity (BMI 30-39.9) 06/08/2020   Pneumonia due to COVID-19 virus 06/08/2020     Social History   Socioeconomic History   Marital status: Widowed    Spouse name: Not on file   Number of children: Not on file   Years of education: Not on file   Highest education level: Not on file  Occupational History   Not on file  Tobacco Use   Smoking status: Never   Smokeless tobacco: Never  Substance and Sexual Activity   Alcohol use: Never   Drug use: Never   Sexual activity: Not Currently  Other Topics Concern   Not on file  Social History Narrative   Not on file   Social Determinants of Health    Financial Resource Strain: Not on file  Food Insecurity: Not on file  Transportation Needs: Not on file  Physical Activity: Not on file  Stress: Not on file  Social Connections: Not on file  Intimate Partner Violence: Not on file    Past Surgical History:  Procedure Laterality Date   COLONOSCOPY     DENTAL SURGERY      Family History  Problem Relation Age of Onset   Emphysema Mother    Lung disease Mother    Brain cancer Father     Allergies  Allergen Reactions   Clonidine Other (See Comments)    unk   Nisoldipine Other (See Comments)    unk   Penicillins Other (See Comments)    welps   Quinacrine Other (See Comments)    unk  Quinapril Other (See Comments)    unk    Current Outpatient Medications on File Prior to Visit  Medication Sig Dispense Refill   aspirin EC 81 MG EC tablet Take 1 tablet (81 mg total) by mouth daily. Swallow whole. 30 tablet 11   brimonidine (ALPHAGAN) 0.2 % ophthalmic solution 1 drop 2 (two) times daily.     carvedilol (COREG) 3.125 MG tablet Take 1 tablet (3.125 mg total) by mouth 2 (two) times daily. 180 tablet 3   Cholecalciferol (VITAMIN D3) 10 MCG (400 UNIT) CAPS Take by mouth in the morning and at bedtime.     lidocaine 4 % Place 1 patch onto the skin daily. 5 patch 0   losartan (COZAAR) 50 MG tablet TAKE 2 TABLETS BY MOUTH EVERY DAY 180 tablet 1   nitroGLYCERIN (NITROSTAT) 0.4 MG SL tablet PLACE 1 TABLET UNDER THE TONGUE EVERY 5 MINUTES AS NEEDED NOT TO EXCEED 3 IN 15 MINUTES 25 tablet 2   triamterene-hydrochlorothiazide (MAXZIDE-25) 37.5-25 MG tablet TAKE 1 TABLET BY MOUTH DAILY. DISCONTINUE CHLORTHALIDONE 90 tablet 1   No current facility-administered medications on file prior to visit.    BP (!) 131/52 Comment: at home this morning  Pulse 78   Resp 18   Ht '5\' 1"'  (1.549 m)   Wt 207 lb (93.9 kg)   SpO2 98%   BMI 39.11 kg/m        Objective:   Physical Exam  General Mental Status- Alert. General Appearance- Not in  acute distress.   Skin General: Color- Normal Color. Moisture- Normal Moisture.  Neck Carotid Arteries- Normal color. Moisture- Normal Moisture. No carotid bruits. No JVD.  Chest and Lung Exam Auscultation: Breath Sounds:-Normal.  Cardiovascular Auscultation:Rythm- Regular. Murmurs & Other Heart Sounds:Auscultation of the heart reveals- No Murmurs.  Abdomen Inspection:-Inspeection Normal. Palpation/Percussion:Note:No mass. Palpation and Percussion of the abdomen reveal- Non Tender, Non Distended + BS, no rebound or guarding.   Neurologic Cranial Nerve exam:- CN III-XII intact(No nystagmus), symmetric smile. Strength:- 5/5 equal and symmetric strength both upper and lower extremities.       Assessment & Plan:   Patient Instructions  Htn- you bp reading at cardiologist yesterday was borderline and at home much better. Today bp very high when  CMA first checked. Then came down to 158/85 shen I checked. Your bp readigs at home very well controlled so considering white coat component to in office readings. Continue current bp med regimen as advised by cardiologist. Keep checking bp at home. If bp increasing above 140/90 let your cardiologist know. Update our office as well.  Recent bradcyardia but good pulse when we checked. Follow cardiologist advise on reducing coreg dose.  Gout- recent uric acid level was 11.7. no recent flares.  Decreased gfr and increased cr. Will get cmp today.  After uric acid and cmp review considering referral to rheumatologist.  For elevated sugar but not in diabetic range recently recommend low sugar diet.     Follow up in 6-8 weeks or sooner if needed.   Mackie Pai, PA-C    Time spent today was 41  minutes which consisted of chart review, discussing diagnoses , work up , treatment , pre charting this morning and documentation.

## 2022-03-13 ENCOUNTER — Ambulatory Visit
Admission: RE | Admit: 2022-03-13 | Discharge: 2022-03-13 | Disposition: A | Discharge status: Home / Self Care | Payer: Medicare PPO | Source: Ambulatory Visit | Attending: Medical | Admitting: Medical

## 2022-03-13 DIAGNOSIS — R928 Other abnormal and inconclusive findings on diagnostic imaging of breast: Secondary | ICD-10-CM

## 2022-03-13 DIAGNOSIS — N6001 Solitary cyst of right breast: Secondary | ICD-10-CM | POA: Diagnosis not present

## 2022-03-14 DIAGNOSIS — H40053 Ocular hypertension, bilateral: Secondary | ICD-10-CM | POA: Diagnosis not present

## 2022-03-20 ENCOUNTER — Encounter: Payer: Self-pay | Admitting: Cardiology

## 2022-03-20 ENCOUNTER — Encounter: Payer: Self-pay | Admitting: Medical

## 2022-03-30 ENCOUNTER — Telehealth: Payer: Medicare PPO | Admitting: Family Medicine

## 2022-03-30 DIAGNOSIS — M1000 Idiopathic gout, unspecified site: Secondary | ICD-10-CM | POA: Diagnosis not present

## 2022-03-30 MED ORDER — PREDNISONE 20 MG PO TABS: ORAL_TABLET | ORAL | 0 refills | Status: DC

## 2022-03-30 NOTE — Progress Notes (Signed)
E-Visit for Gout Symptoms  We are sorry that you are not feeling well. We are here to help!  Based on what you shared with me it looks like you have a flare of your gout.  Gout is a form of arthritis. It can cause pain and swelling in the joints. At first, it tends to affect only 1 joint - most frequently the big toe. It happens in people who have too much uric acid in the blood. Uric acid is a chemical that is produced when the body breaks down certain foods. Uric acid can form sharp needle-like crystals that build up in the joints and cause pain. Uric acid crystals can also form inside the tubes that carry urine from the kidneys to the bladder. These crystals can turn into "kidney stones" that can cause pain and problems with the flow of urine. People with gout get sudden "flares" or attacks of severe pain, most often the big toe, ankle, or knee. Often the joint also turns red and swells. Usually, only 1 joint is affected, but some people have pain in more than 1 joint. Gout flares tend to happen more often during the night.  The pain from gout can be extreme. The pain and swelling are worst at the beginning of a gout flare. The symptoms then get better within a few days to weeks. It is not clear how the body "turns off" a gout flare.  Do not start any NEW preventative medicine until the gout has cleared completely. However, If you are already on Probenecid or Allopurinol for CHRONIC gout, you may continue taking this during an active flare up  I have prescribed prednisone 40 mg daily for 5 days.    HOME CARE Losing weight can help relieve gout. It's not clear that following a specific diet plan will help with gout symptoms but eating a balanced diet can help improve your overall health. It can also help you lose weight, if you are overweight. In general, a healthy diet includes plenty of fruits, vegetables, whole grains, and low-fat dairy products (labelled "low fat", skim, 2%). Avoid sugar  sweetened drinks (including sodas, tea, juice and juice blends, coffee drinks and sports drinks) Limit alcohol to 1-2 drinks of beer, spirits or wine daily these can make gout flares worse. Some people with gout also have other health problems, such as heart disease, high blood pressure, kidney disease, or obesity. If you have any of these issues, it's important to work with your doctor to manage them. This can help improve your overall health and might also help with your gout.  GET HELP RIGHT AWAY IF: Your symptoms persist after you have completed your treatment plan You develop severe diarrhea You develop abnormal sensations  You develop vomiting,   You develop weakness  You develop abdominal pain  FOLLOW UP WITH YOUR PRIMARY PROVIDER IF: If your symptoms do not improve within 10 days  MAKE SURE YOU  Understand these instructions. Will watch your condition. Will get help right away if you are not doing well or get worse.  Thank you for choosing an e-visit.  Your e-visit answers were reviewed by a board certified advanced clinical practitioner to complete your personal care plan. Depending upon the condition, your plan could have included both over the counter or prescription medications.  Please review your pharmacy choice. Make sure the pharmacy is open so you can pick up prescription now. If there is a problem, you may contact your provider through [employer]  and have the prescription routed to another pharmacy.  Your safety is important to [place]. If you have drug allergies check your prescription carefully.   For the next 24 hours you can use MyChart to ask questions about today's visit, request a non-urgent call back, or ask for a work or school excuse. You will get an email in the next two days asking about your experience. I hope that your e-visit has been valuable and will speed your recovery.   I have provided 5 minutes of non face to face time during this encounter for  chart review and documentation.

## 2022-04-02 ENCOUNTER — Encounter: Payer: Self-pay | Admitting: Cardiology

## 2022-04-02 MED ORDER — LOSARTAN POTASSIUM 50 MG PO TABS: ORAL_TABLET | ORAL | 3 refills | Status: DC

## 2022-04-05 ENCOUNTER — Encounter: Payer: Self-pay | Admitting: Medical

## 2022-04-06 MED ORDER — PREDNISONE 10 MG (21) PO TBPK: ORAL_TABLET | ORAL | 0 refills | Status: DC

## 2022-04-06 NOTE — Addendum Note (Signed)
Addended by: Gwenevere Abbot on: 04/06/2022 08:29 AM   Modules accepted: Orders

## 2022-05-08 ENCOUNTER — Encounter: Payer: Self-pay | Admitting: Cardiology

## 2022-05-11 ENCOUNTER — Other Ambulatory Visit: Payer: Self-pay | Admitting: Cardiology

## 2022-05-16 ENCOUNTER — Other Ambulatory Visit: Payer: Self-pay

## 2022-05-16 MED ORDER — TRIAMTERENE-HCTZ 37.5-25 MG PO TABS: ORAL_TABLET | ORAL | 2 refills | Status: DC

## 2022-05-17 ENCOUNTER — Other Ambulatory Visit (HOSPITAL_COMMUNITY): Payer: Self-pay

## 2022-05-27 ENCOUNTER — Telehealth: Payer: Self-pay | Admitting: Medical

## 2022-05-27 NOTE — Telephone Encounter (Signed)
Left message for patient to call back and schedule Medicare Annual Wellness Visit (AWV).   Please offer to do virtually or by telephone.  Left office number and my jabber #[phone].  AWVI eligible as of 02/14/2022  Please schedule at anytime with Nurse Health Advisor.   

## 2022-06-12 ENCOUNTER — Encounter: Payer: Self-pay | Admitting: Cardiology

## 2022-06-12 MED ORDER — CARVEDILOL 3.125 MG PO TABS: 3.1250 mg | ORAL_TABLET | Freq: Two times a day (BID) | ORAL | 3 refills | Status: DC

## 2022-06-13 DIAGNOSIS — D631 Anemia in chronic kidney disease: Secondary | ICD-10-CM | POA: Diagnosis not present

## 2022-06-13 DIAGNOSIS — N1831 Chronic kidney disease, stage 3a: Secondary | ICD-10-CM | POA: Diagnosis not present

## 2022-06-13 DIAGNOSIS — N2581 Secondary hyperparathyroidism of renal origin: Secondary | ICD-10-CM | POA: Diagnosis not present

## 2022-06-13 DIAGNOSIS — I129 Hypertensive chronic kidney disease with stage 1 through stage 4 chronic kidney disease, or unspecified chronic kidney disease: Secondary | ICD-10-CM | POA: Diagnosis not present

## 2022-06-13 DIAGNOSIS — M109 Gout, unspecified: Secondary | ICD-10-CM | POA: Diagnosis not present

## 2022-06-13 DIAGNOSIS — R609 Edema, unspecified: Secondary | ICD-10-CM | POA: Diagnosis not present

## 2022-06-19 ENCOUNTER — Other Ambulatory Visit: Payer: Self-pay | Admitting: Nephrology

## 2022-06-19 DIAGNOSIS — N1831 Chronic kidney disease, stage 3a: Secondary | ICD-10-CM

## 2022-06-20 ENCOUNTER — Encounter: Payer: Self-pay | Admitting: Cardiology

## 2022-06-24 ENCOUNTER — Encounter: Payer: Self-pay | Admitting: Cardiology

## 2022-06-24 ENCOUNTER — Ambulatory Visit
Admission: RE | Admit: 2022-06-24 | Discharge: 2022-06-24 | Disposition: A | Discharge status: Home / Self Care | Payer: Medicare PPO | Source: Ambulatory Visit | Attending: Nephrology | Admitting: Nephrology

## 2022-06-24 ENCOUNTER — Ambulatory Visit: Payer: Medicare PPO | Attending: Cardiology | Admitting: Cardiology

## 2022-06-24 VITALS — BP 164/78 | HR 66 | Ht 61.0 in | Wt 212.2 lb

## 2022-06-24 DIAGNOSIS — M7989 Other specified soft tissue disorders: Secondary | ICD-10-CM | POA: Diagnosis not present

## 2022-06-24 DIAGNOSIS — N1831 Chronic kidney disease, stage 3a: Secondary | ICD-10-CM

## 2022-06-24 DIAGNOSIS — I10 Essential (primary) hypertension: Secondary | ICD-10-CM

## 2022-06-24 DIAGNOSIS — N189 Chronic kidney disease, unspecified: Secondary | ICD-10-CM | POA: Diagnosis not present

## 2022-06-24 MED ORDER — VALSARTAN 80 MG PO TABS: 80.0000 mg | ORAL_TABLET | Freq: Two times a day (BID) | ORAL | 3 refills | Status: DC

## 2022-06-24 MED ORDER — POTASSIUM CHLORIDE ER 10 MEQ PO TBCR: 10.0000 meq | EXTENDED_RELEASE_TABLET | ORAL | 3 refills | Status: DC

## 2022-06-24 MED ORDER — FUROSEMIDE 40 MG PO TABS: 40.0000 mg | ORAL_TABLET | ORAL | 3 refills | Status: DC

## 2022-06-24 NOTE — Patient Instructions (Addendum)
Medication Instructions:  Your physician has recommended you make the following change in your medication:  STOP: Losartan  START: Valsartan 80mg  Twice daily.  START: Lasix 40mg  and Potassium 10 meq ONCE a week as needed. *If you need a refill on your cardiac medications before your next appointment, please call your pharmacy*   Lab Work: NONE ordered at this time of appointment  If you have labs (blood work) drawn today and your tests are completely normal, you will receive your results only by: [place] (if you have MyChart) OR A paper copy in the mail If you have any lab test that is abnormal or we need to change your treatment, we will call you to review the results.   Testing/Procedures: NONE ordered at this time of appointment    Follow-Up: At [facility], you and your health needs are our priority.  As part of our continuing mission to provide you with exceptional heart care, we have created designated Provider Care Teams.  These Care Teams include your primary Cardiologist (physician) and Advanced Practice Providers (APPs -  Physician Assistants and Nurse Practitioners) who all work together to provide you with the care you need, when you need it.  We recommend signing up for the patient portal called "MyChart".  Sign up information is provided on this After Visit Summary.  MyChart is used to connect with patients for Virtual Visits (Telemedicine).  Patients are able to view lab/test results, encounter notes, upcoming appointments, etc.  Non-urgent messages can be sent to your provider as well.   To learn more about what you can do with MyChart, go to [email or web address].    Other Instructions Please keep upcoming appointment in December

## 2022-06-24 NOTE — Progress Notes (Signed)
Cardiology Office Note:    Date:  06/27/2022   ID:  Konni Kesinger, DOB [birth year], MRN [id number]  PCP:  Mackie Pai, PA-C  Cardiologist:  Berniece Salines, DO  Electrophysiologist:  None   Referring MD: Mackie Pai, PA-C   " I am ok, but I have been experiencing shoulder pain"  History of Present Illness:    Latoya Rios is a 66 y.o. female with a hx of  hypertension here today for follow-up visit.I saw the patient in October 2021 at that time she was hypotensive therefore I decreased her hydralazine and stopped the Aldactone.    Her visit on July 21, 2020 the patient reported that she had been experiencing some dizziness and was concerned about this.  I placed a monitor on the patient and also had blood pressure cuff mailed to the patient and encouraged her to take her blood pressure sitting and standing.   I last saw the patient on August 14, 2020 at that time her blood pressure was elevated I increased her carvedilol to 6.25 mg twice a day.  We talked about her monitor results which show occasional PACs, PVCs as well as paroxysmal atrial tachycardia.  She also had questions about exercise as and we discussed this.   I last saw the patient via video visit on September 06, 2020 at that time she appeared to be doing well.  No changes were made in her medications.   I saw the patient December 05, 2019.  She reported continued chest discomfort.  We will schedule coronary CTA.-Patient was able to get a coronary CTA which did not show any evidence of coronary artery disease.  I saw the patient on 06/18/2021 at that time she was doing wel, she had slight bp elevation in the office but I reviewed her home bp which were within normal.   I saw the patient on February 28, 2022 at that time she experiencing some shoulder pain and muscle pain.  During that visit her heart rate was 44, I cut back on her carvedilol to 3.125 mg twice daily.  We talked about likely possibilities for pain  relief.  Since I saw the patient she has had multiple gout flares.  She has been taken off the diuretics.  Her medications has been adjusted but it appears that her blood pressure has not improved.  Past Medical History:  Diagnosis Date   Abnormal renal function 03/01/2019   Formatting of this note is different from the original. eGFR  >=60.0 mL/min/1.87m2 >60.0  31.2Low  CM  >60.0 CM     Last Assessment & Plan:  Formatting of this note might be different from the original. Resolved as of last labs Rechecking labs to ensure stability   Accelerated hypertension 06/08/2020   Acute cystitis without hematuria 06/24/2020   Acute hypoxemic respiratory failure due to COVID-19 ([facility] 06/08/2020   [place]ute kidney injury ([facility] 06/23/2020   AK[place] kidney [place]) [place] [place] [facility] 06/24/2020   ARF (acute renal failure) ([place] 06/24/2020   Benign essential hypertension 05/29/2016   Last Assessment & Plan:  Formatting of this note is different from the original. Pertinent Data:   Current medication includes: carvediloL - 3.125 mg losartan-hydroCHLOROthiazide - 100-25 mg .  BP Readings from Last 3 Encounters:  02/29/20 (!) 206/86  08/31/19 (!) 142/78  08/16/19 (!) 160/56   LDL Calculated (mg/dL)  Date Value  08/31/2019 144 (H)  03/01/2019 156 (H)   Creatinine (mg/dL)  Date Val   CHF (congestive heart failure) ([place]  Class 2 severe obesity due to excess calories with serious comorbidity and body mass index (BMI) of 37.0 to 37.9 in adult [facility]) 05/29/2016   Formatting of this note is different from the original. Wt Readings from Last 3 Encounters:  08/31/19 233 lb  08/16/19 239 lb  03/01/19 249 lb    Last Assessment & Plan:  Formatting of this note might be different from the original. BMI Assessment: Current Body mass index is 37.22 kg/m.  Patient BMI currently is above average (>25 kg/m2); BMI follow up plan is completed . Current barriers to heal   Demand ischemia 06/08/2020   Drug declined by patient 02/29/2020   Last Assessment  & Plan:  Formatting of this note might be different from the original. Patient historically has declined statins and at some point was on losartan/HCTZ and carvedilol, but stopped taking them stating " I dont want to take medications ".  Today I again discussed with patient the risks of uncontrolled HYPERLIPIDEMIA and HYPERTENSION including but not limited to strokes, MIs, renal in   Encounter for preventative adult health care exam with abnormal findings 06/12/2017   Last Assessment & Plan:  Formatting of this note might be different from the original. Patient for wellness visit.  Overall doing good. No new concerns. Reviewed appropriate age screenings and vaccinations.  Influenza vaccine: Patient Declined Tdap/TD: Patient Declined Zoster vaccine: Patient Declined  Colonoscopy: UTD, due 2022 Mammogram: UTD  Depression screening: negative No falls reported  Dis   Gynecologic exam normal 08/16/2019   Last Assessment & Plan:  Formatting of this note might be different from the original. Pap smear with high risk hpv collected for screening Discussed and encouraged healthy lifestyle.  Always use sunscreen when sun exposed.  Encouraged healthy diet with fresh fruits, vegetables, and lean meat.  Encouraged regular exercise for heart health and bone health.  Encouraged continue monthly SBE and yearl   Heart murmur    Hypertension    Hypotension due to drugs 06/22/2020   Mixed hyperlipidemia    Obese    Obesity (BMI 30-39.9) 06/08/2020   Pneumonia due to COVID-19 virus 06/08/2020    Past Surgical History:  Procedure Laterality Date   COLONOSCOPY     DENTAL SURGERY      Current Medications: Current Meds  Medication Sig   allopurinol (ZYLOPRIM) 100 MG tablet Take 100 mg by mouth daily.   brimonidine (ALPHAGAN) 0.2 % ophthalmic solution 1 drop 2 (two) times daily.   carvedilol (COREG) 3.125 MG tablet Take 1 tablet (3.125 mg total) by mouth 2 (two) times daily.   Cholecalciferol (VITAMIN D3) 10 MCG (400  UNIT) CAPS Take by mouth in the morning and at bedtime.   furosemide (LASIX) 40 MG tablet Take 1 tablet (40 mg total) by mouth once a week.   lidocaine 4 % Place 1 patch onto the skin daily.   Multiple Vitamin (MULTIVITAMIN) tablet Take 1 tablet by mouth daily.   nitroGLYCERIN (NITROSTAT) 0.4 MG SL tablet PLACE 1 TABLET UNDER THE TONGUE EVERY 5 MINUTES AS NEEDED NOT TO EXCEED 3 IN 15 MINUTES   potassium chloride (KLOR-CON) 10 MEQ tablet Take 1 tablet (10 mEq total) by mouth once a week.   TART CHERRY PO Take 1 tablet by mouth daily.   valsartan (DIOVAN) 80 MG tablet Take 1 tablet (80 mg total) by mouth 2 (two) times daily.   [DISCONTINUED] losartan (COZAAR) 50 MG tablet TAKE 2 TABLETS BY MOUTH EVERY DAY     Allergies:  Clonidine, Nisoldipine, Penicillins, Quinacrine, and Quinapril   Social History   Socioeconomic History   Marital status: Widowed    Spouse name: Not on file   Number of children: Not on file   Years of education: Not on file   Highest education level: Not on file  Occupational History   Not on file  Tobacco Use   Smoking status: Never   Smokeless tobacco: Never  Substance and Sexual Activity   Alcohol use: Never   Drug use: Never   Sexual activity: Not Currently  Other Topics Concern   Not on file  Social History Narrative   Not on file   Social Determinants of Health   Financial Resource Strain: Not on file  Food Insecurity: Not on file  Transportation Needs: Not on file  Physical Activity: Not on file  Stress: Not on file  Social Connections: Not on file     Family History: The patient's family history includes Brain cancer in her father; Emphysema in her mother; Lung disease in her mother.  ROS:   Review of Systems  Constitution: Negative for decreased appetite, fever and weight gain.  HENT: Negative for congestion, ear discharge, hoarse voice and sore throat.   Eyes: Negative for discharge, redness, vision loss in right eye and visual halos.   Cardiovascular: Negative for chest pain, dyspnea on exertion, leg swelling, orthopnea and palpitations.  Respiratory: Negative for cough, hemoptysis, shortness of breath and snoring.   Endocrine: Negative for heat intolerance and polyphagia.  Hematologic/Lymphatic: Negative for bleeding problem. Does not bruise/bleed easily.  Skin: Negative for flushing, nail changes, rash and suspicious lesions.  Musculoskeletal: Negative for arthritis, joint pain, muscle cramps, myalgias, neck pain and stiffness.  Gastrointestinal: Negative for abdominal pain, bowel incontinence, diarrhea and excessive appetite.  Genitourinary: Negative for decreased libido, genital sores and incomplete emptying.  Neurological: Negative for brief paralysis, focal weakness, headaches and loss of balance.  Psychiatric/Behavioral: Negative for altered mental status, depression and suicidal ideas.  Allergic/Immunologic: Negative for HIV exposure and persistent infections.    EKGs/Labs/Other Studies Reviewed:    The following studies were reviewed today:   EKG:  The ekg ordered today demonstrates sinus bradycardia with arrhythmia and ventricular escape   TTE 05/2020 IMPRESSIONS     1. Left ventricular ejection fraction, by estimation, is 60 to 65%. The  left ventricle has normal function. The left ventricle has no regional  wall motion abnormalities. Left ventricular diastolic parameters are  consistent with Grade I diastolic  dysfunction (impaired relaxation).   2. Right ventricular systolic function is normal. The right ventricular  size is normal. Tricuspid regurgitation signal is inadequate for assessing  PA pressure.   3. The mitral valve is grossly normal. Mild mitral valve regurgitation.  No evidence of mitral stenosis.   4. The aortic valve is grossly normal. Aortic valve regurgitation is not  visualized. No aortic stenosis is present.   5. The inferior vena cava is normal in size with <50% respiratory   variability, suggesting right atrial pressure of 8 mmHg.   FINDINGS   Left Ventricle: Left ventricular ejection fraction, by estimation, is 60  to 65%. The left ventricle has normal function. The left ventricle has no  regional wall motion abnormalities. The left ventricular internal cavity  size was normal in size. There is   no left ventricular hypertrophy. Left ventricular diastolic parameters  are consistent with Grade I diastolic dysfunction (impaired relaxation).   Right Ventricle: The right ventricular size is normal. No  increase in  right ventricular wall thickness. Right ventricular systolic function is  normal. Tricuspid regurgitation signal is inadequate for assessing PA  pressure.   Left Atrium: Left atrial size was normal in size.   Right Atrium: Right atrial size was normal in size.   Pericardium: Trivial pericardial effusion is present. Presence of  pericardial fat pad.   Mitral Valve: The mitral valve is grossly normal. Mild mitral valve  regurgitation. No evidence of mitral valve stenosis.   Tricuspid Valve: The tricuspid valve is grossly normal. Tricuspid valve  regurgitation is trivial. No evidence of tricuspid stenosis.   Aortic Valve: The aortic valve is grossly normal. Aortic valve  regurgitation is not visualized. No aortic stenosis is present.   Pulmonic Valve: The pulmonic valve was grossly normal. Pulmonic valve  regurgitation is not visualized. No evidence of pulmonic stenosis.   Aorta: The aortic root is normal in size and structure.   Venous: The inferior vena cava is normal in size with less than 50%  respiratory variability, suggesting right atrial pressure of 8 mmHg.   IAS/Shunts: The atrial septum is grossly normal.        CCTA 03/12/2021 Aorta:  Normal size.  No calcifications.  No dissection.   Aortic Valve:  Trileaflet.  No calcifications.   Coronary Arteries:  Normal coronary origin.  Right dominance.   RCA is a large dominant  artery that gives rise to PDA and PLA. There is no plaque.   Left main is a large artery that gives rise to LAD, Ramus intermedius and LCX arteries.   LAD is a large vessel that has no plaque.   Ramus with no plaques.   LCX is a non-dominant artery that gives rise to one large OM1 branch. There is no plaque.   Other findings:   Normal variant pulmonary vein (including right middle) all draining into the left atrium.   Normal left atrial appendage without a thrombus.   Normal size of the pulmonary artery.   IMPRESSION: 1. Coronary calcium score of 0. This was 0 percentile for age and sex matched control.   2. Normal coronary origin with right dominance.   3. No evidence of CAD. CAD-RADS 0. No evidence of CAD (0%). Consider non-atherosclerotic causes of chest pain.     Electronically Signed   By: Berniece Salines DO   On: 03/12/2021 17:07    Addended by Berniece Salines, DO on 03/12/2021  5:09 PM    Study Result   Narrative & Impression  EXAM: OVER-READ INTERPRETATION  CT CHEST   The following report is an over-read performed by radiologist Dr. Aletta Edouard of [facility], [place] on 03/12/2021. This over-read does not include interpretation of cardiac or coronary anatomy or pathology. The coronary CTA interpretation by the cardiologist is attached.   COMPARISON:  None.   FINDINGS: Vascular: No significant noncardiac vascular findings.   Mediastinum/Nodes: Visualized mediastinum and hilar regions demonstrate no lymphadenopathy or masses.   Lungs/Pleura: Visualized lungs show no evidence of pulmonary edema, consolidation, pneumothorax, nodule or pleural fluid.   Upper Abdomen: No acute abnormality.   Musculoskeletal: No chest wall mass or suspicious bone lesions identified.   IMPRESSION: No significant incidental findings.   Electronically Signed: By: Aletta Edouard M.D. On: 03/12/2021 16:06    Recent Labs: 11/28/2021: Hemoglobin 12.6; Platelets  296.0 03/01/2022: ALT 12; BUN 52; Creatinine, Ser 1.23; Potassium 4.4; Sodium 138  Recent Lipid Panel    Component Value Date/Time   TRIG 84 06/08/2020 0225  Physical Exam:    VS:  BP (!) 164/78   Pulse 66   Ht _0  (1.549 m)   Wt 212 lb 3.2 oz (96.3 kg)   SpO2 98%   BMI 40.09 kg/m     Wt Readings from Last 3 Encounters:  06/24/22 212 lb 3.2 oz (96.3 kg)  03/01/22 207 lb (93.9 kg)  02/28/22 207 lb 3.2 oz (94 kg)     GEN: Well nourished, well developed in no acute distress HEENT: Normal NECK: No JVD; No carotid bruits LYMPHATICS: No lymphadenopathy CARDIAC: S1S2 noted,RRR, no murmurs, rubs, gallops RESPIRATORY:  Clear to auscultation without rales, wheezing or rhonchi  ABDOMEN: Soft, non-tender, non-distended, +bowel sounds, no guarding. EXTREMITIES:+2 lower extremity edema, No cyanosis, no clubbing MUSCULOSKELETAL:  No deformity  SKIN: Warm and dry NEUROLOGIC:  Alert and oriented x 3, non-focal PSYCHIATRIC:  Normal affect, good insight  ASSESSMENT:    1. Accelerated hypertension   2. Leg swelling     PLAN:    Blood pressure is elevated in the office-we will stop losartan start patient on valsartan 80 mg twice a day.  Continue her Coreg 3.125 mg twice daily.  Appropriately her taste diuretic and potassium diuretic were stopped due to sclerosis.  Hydralazine was recommended had not been started.  We will hold off on this since we are switching her to another antihypertensive medication.  She can benefit from cautious loop diuretic given her significant lower extremity swelling-Lasix 40 mg once weekly.  She also follow with nephro.  The patient is in agreement with the above plan. The patient left the office in stable condition.  The patient will follow up in   Medication Adjustments/Labs and Tests Ordered: Current medicines are reviewed at length with the patient today.  Concerns regarding medicines are outlined above.  No orders of the defined types were placed  in this encounter.  Meds ordered this encounter  Medications   valsartan (DIOVAN) 80 MG tablet    Sig: Take 1 tablet (80 mg total) by mouth 2 (two) times daily.    Dispense:  180 tablet    Refill:  3   furosemide (LASIX) 40 MG tablet    Sig: Take 1 tablet (40 mg total) by mouth once a week.    Dispense:  12 tablet    Refill:  3   potassium chloride (KLOR-CON) 10 MEQ tablet    Sig: Take 1 tablet (10 mEq total) by mouth once a week.    Dispense:  12 tablet    Refill:  3    Patient Instructions  Medication Instructions:  Your physician has recommended you make the following change in your medication:  STOP: Losartan  START: Valsartan 65m Twice daily.  START: Lasix 417mand Potassium 10 meq ONCE a week as needed. *If you need a refill on your cardiac medications before your next appointment, please call your pharmacy*   Lab Work: NONE ordered at this time of appointment  If you have labs (blood work) drawn today and your tests are completely normal, you will receive your results only by: MyPackwoodif you have MyChart) OR A paper copy in the mail If you have any lab test that is abnormal or we need to change your treatment, we will call you to review the results.   Testing/Procedures: NONE ordered at this time of appointment    Follow-Up: [facility] needs are our priority.  As part of our continuing mission  to provide you with exceptional heart care, we have created designated Provider Care Teams.  These Care Teams include your primary Cardiologist (physician) and Advanced Practice Providers (APPs -  Physician Assistants and Nurse Practitioners) who all work together to provide you with the care you need, when you need it.  We recommend signing up for the patient portal called "MyChart".  Sign up information is provided on this After Visit Summary.  MyChart is used to connect with patients for Virtual Visits (Telemedicine).  Patients are  able to view lab/test results, encounter notes, upcoming appointments, etc.  Non-urgent messages can be sent to your provider as well.   To learn more about what you can do with MyChart, go to [email or web address].    Other Instructions Please keep upcoming appointment in December    Adopting a Healthy Lifestyle.  Know what a healthy weight is for you (roughly BMI <25) and aim to maintain this   Aim for 7+ servings of fruits and vegetables daily   65-80+ fluid ounces of water or unsweet tea for healthy kidneys   Limit to max 1 drink of alcohol per day; avoid smoking/tobacco   Limit animal fats in diet for cholesterol and heart health - choose grass fed whenever available   Avoid highly processed foods, and foods high in saturated/trans fats   Aim for low stress - take time to unwind and care for your mental health   Aim for 150 min of moderate intensity exercise weekly for heart health, and weights twice weekly for bone health   Aim for 7-9 hours of sleep daily   When it comes to diets, agreement about the perfect plan isnt easy to find, even among the experts. Experts at the [employer] developed an idea known as the Healthy Eating Plate. Just imagine a plate divided into logical, healthy portions.   The emphasis is on diet quality:   Load up on vegetables and fruits - one-half of your plate: Aim for color and variety, and remember that potatoes dont count.   Go for whole grains - one-quarter of your plate: Whole wheat, barley, wheat berries, quinoa, oats, brown rice, and foods made with them. If you want pasta, go with whole wheat pasta.   Protein power - one-quarter of your plate: Fish, chicken, beans, and nuts are all healthy, versatile protein sources. Limit red meat.   The diet, however, does go beyond the plate, offering a few other suggestions.   Use healthy plant oils, such as olive, canola, soy, corn, sunflower and peanut. Check the labels,  and avoid partially hydrogenated oil, which have unhealthy trans fats.   If youre thirsty, drink water. Coffee and tea are good in moderation, but skip sugary drinks and limit milk and dairy products to one or two daily servings.   The type of carbohydrate in the diet is more important than the amount. Some sources of carbohydrates, such as vegetables, fruits, whole grains, and beans-are healthier than others.   Finally, stay active  Signed, Berniece Salines, DO  06/27/2022 4:57 PM    [facility]

## 2022-07-10 ENCOUNTER — Encounter: Payer: Self-pay | Admitting: Cardiology

## 2022-07-11 MED ORDER — VALSARTAN 160 MG PO TABS: 160.0000 mg | ORAL_TABLET | Freq: Two times a day (BID) | ORAL | 2 refills | Status: DC

## 2022-07-11 NOTE — Telephone Encounter (Signed)
Sent message to patient's proxy. Prescription was sent to pharmacy as prescribed.

## 2022-07-30 DIAGNOSIS — M2560 Stiffness of unspecified joint, not elsewhere classified: Secondary | ICD-10-CM | POA: Diagnosis not present

## 2022-07-30 DIAGNOSIS — Z6841 Body Mass Index (BMI) 40.0 and over, adult: Secondary | ICD-10-CM | POA: Diagnosis not present

## 2022-07-30 DIAGNOSIS — M2540 Effusion, unspecified joint: Secondary | ICD-10-CM | POA: Diagnosis not present

## 2022-07-30 DIAGNOSIS — M79671 Pain in right foot: Secondary | ICD-10-CM | POA: Diagnosis not present

## 2022-07-30 DIAGNOSIS — R635 Abnormal weight gain: Secondary | ICD-10-CM | POA: Diagnosis not present

## 2022-07-30 DIAGNOSIS — M109 Gout, unspecified: Secondary | ICD-10-CM | POA: Diagnosis not present

## 2022-07-30 DIAGNOSIS — M79672 Pain in left foot: Secondary | ICD-10-CM | POA: Diagnosis not present

## 2022-08-10 ENCOUNTER — Other Ambulatory Visit: Payer: Self-pay | Admitting: Cardiology

## 2022-08-27 DIAGNOSIS — M109 Gout, unspecified: Secondary | ICD-10-CM | POA: Diagnosis not present

## 2022-09-04 ENCOUNTER — Ambulatory Visit (INDEPENDENT_AMBULATORY_CARE_PROVIDER_SITE_OTHER): Payer: Medicare PPO

## 2022-09-04 ENCOUNTER — Ambulatory Visit: Payer: Medicare PPO | Admitting: Cardiology

## 2022-09-04 VITALS — Wt 202.0 lb

## 2022-09-04 DIAGNOSIS — Z0000 Encounter for general adult medical examination without abnormal findings: Secondary | ICD-10-CM

## 2022-09-04 NOTE — Patient Instructions (Signed)
Latoya Rios , Thank you for taking time to come for your Medicare Wellness Visit. I appreciate your ongoing commitment to your health goals. Please review the following plan we discussed and let me know if I can assist you in the future.   These are the goals we discussed:  Goals      Patient Stated     Walk more         This is a list of the screening recommended for you and due dates:  Health Maintenance  Topic Date Due   COVID-19 Vaccine (1) Never done   Hepatitis C Screening: USPSTF Recommendation to screen - Ages 58-79 yo.  Never done   Zoster (Shingles) Vaccine (1 of 2) Never done   Pneumonia Vaccine (1 - PCV) Never done   Flu Shot  Never done   DEXA scan (bone density measurement)  09/05/2023*   Colon Cancer Screening  09/05/2023*   Medicare Annual Wellness Visit  09/05/2023   Mammogram  02/06/2024   DTaP/Tdap/Td vaccine (2 - Td or Tdap) 07/25/2030   HPV Vaccine  Aged Out  *Topic was postponed. The date shown is not the original due date.    Advanced directives: Please bring a copy of your health care power of attorney and living will to the office at your convenience.  Conditions/risks identified: walk more   Next appointment: Follow up in one year for your annual wellness visit   Preventive Care 65 Years and Older, Female Preventive care refers to lifestyle choices and visits with your health care provider that can promote health and wellness. What does preventive care include? A yearly physical exam. This is also called an annual well check. Dental exams once or twice a year. Routine eye exams. Ask your health care provider how often you should have your eyes checked. Personal lifestyle choices, including: Daily care of your teeth and gums. Regular physical activity. Eating a healthy diet. Avoiding tobacco and drug use. Limiting alcohol use. Practicing safe sex. Taking low-dose aspirin every day. Taking vitamin and mineral supplements as recommended by your  health care provider. What happens during an annual well check? The services and screenings done by your health care provider during your annual well check will depend on your age, overall health, lifestyle risk factors, and family history of disease. Counseling  Your health care provider may ask you questions about your: Alcohol use. Tobacco use. Drug use. Emotional well-being. Home and relationship well-being. Sexual activity. Eating habits. History of falls. Memory and ability to understand (cognition). Work and work Astronomer. Reproductive health. Screening  You may have the following tests or measurements: Height, weight, and BMI. Blood pressure. Lipid and cholesterol levels. These may be checked every 5 years, or more frequently if you are over 4 years old. Skin check. Lung cancer screening. You may have this screening every year starting at age 6 if you have a 30-pack-year history of smoking and currently smoke or have quit within the past 15 years. Fecal occult blood test (FOBT) of the stool. You may have this test every year starting at age 43. Flexible sigmoidoscopy or colonoscopy. You may have a sigmoidoscopy every 5 years or a colonoscopy every 10 years starting at age 40. Hepatitis C blood test. Hepatitis B blood test. Sexually transmitted disease (STD) testing. Diabetes screening. This is done by checking your blood sugar (glucose) after you have not eaten for a while (fasting). You may have this done every 1-3 years. Bone density scan. This is  done to screen for osteoporosis. You may have this done starting at age 79. Mammogram. This may be done every 1-2 years. Talk to your health care provider about how often you should have regular mammograms. Talk with your health care provider about your test results, treatment options, and if necessary, the need for more tests. Vaccines  Your health care provider may recommend certain vaccines, such as: Influenza vaccine.  This is recommended every year. Tetanus, diphtheria, and acellular pertussis (Tdap, Td) vaccine. You may need a Td booster every 10 years. Zoster vaccine. You may need this after age 65. Pneumococcal 13-valent conjugate (PCV13) vaccine. One dose is recommended after age 41. Pneumococcal polysaccharide (PPSV23) vaccine. One dose is recommended after age 65. Talk to your health care provider about which screenings and vaccines you need and how often you need them. This information is not intended to replace advice given to you by your health care provider. Make sure you discuss any questions you have with your health care provider. Document Released: 09/29/2015 Document Revised: 05/22/2016 Document Reviewed: 07/04/2015 Elsevier Interactive Patient Education  2017 [place] Prevention in the Home Falls can cause injuries. They can happen to people of all ages. There are many things you can do to make your home safe and to help prevent falls. What can I do on the outside of my home? Regularly fix the edges of walkways and driveways and fix any cracks. Remove anything that might make you trip as you walk through a door, such as a raised step or threshold. Trim any bushes or trees on the path to your home. Use bright outdoor lighting. Clear any walking paths of anything that might make someone trip, such as rocks or tools. Regularly check to see if handrails are loose or broken. Make sure that both sides of any steps have handrails. Any raised decks and porches should have guardrails on the edges. Have any leaves, snow, or ice cleared regularly. Use sand or salt on walking paths during winter. Clean up any spills in your garage right away. This includes oil or grease spills. What can I do in the bathroom? Use night lights. Install grab bars by the toilet and in the tub and shower. Do not use towel bars as grab bars. Use non-skid mats or decals in the tub or shower. If you need to sit  down in the shower, use a plastic, non-slip stool. Keep the floor dry. Clean up any water that spills on the floor as soon as it happens. Remove soap buildup in the tub or shower regularly. Attach bath mats securely with double-sided non-slip rug tape. Do not have throw rugs and other things on the floor that can make you trip. What can I do in the bedroom? Use night lights. Make sure that you have a light by your bed that is easy to reach. Do not use any sheets or blankets that are too big for your bed. They should not hang down onto the floor. Have a firm chair that has side arms. You can use this for support while you get dressed. Do not have throw rugs and other things on the floor that can make you trip. What can I do in the kitchen? Clean up any spills right away. Avoid walking on wet floors. Keep items that you use a lot in easy-to-reach places. If you need to reach something above you, use a strong step stool that has a grab bar. Keep electrical cords out of  the way. Do not use floor polish or wax that makes floors slippery. If you must use wax, use non-skid floor wax. Do not have throw rugs and other things on the floor that can make you trip. What can I do with my stairs? Do not leave any items on the stairs. Make sure that there are handrails on both sides of the stairs and use them. Fix handrails that are broken or loose. Make sure that handrails are as long as the stairways. Check any carpeting to make sure that it is firmly attached to the stairs. Fix any carpet that is loose or worn. Avoid having throw rugs at the top or bottom of the stairs. If you do have throw rugs, attach them to the floor with carpet tape. Make sure that you have a light switch at the top of the stairs and the bottom of the stairs. If you do not have them, ask someone to add them for you. What else can I do to help prevent falls? Wear shoes that: Do not have high heels. Have rubber bottoms. Are  comfortable and fit you well. Are closed at the toe. Do not wear sandals. If you use a stepladder: Make sure that it is fully opened. Do not climb a closed stepladder. Make sure that both sides of the stepladder are locked into place. Ask someone to hold it for you, if possible. Clearly mark and make sure that you can see: Any grab bars or handrails. First and last steps. Where the edge of each step is. Use tools that help you move around (mobility aids) if they are needed. These include: Canes. Walkers. Scooters. Crutches. Turn on the lights when you go into a dark area. Replace any light bulbs as soon as they burn out. Set up your furniture so you have a clear path. Avoid moving your furniture around. If any of your floors are uneven, fix them. If there are any pets around you, be aware of where they are. Review your medicines with your doctor. Some medicines can make you feel dizzy. This can increase your chance of falling. Ask your doctor what other things that you can do to help prevent falls. This information is not intended to replace advice given to you by your health care provider. Make sure you discuss any questions you have with your health care provider. Document Released: 06/29/2009 Document Revised: 02/08/2016 Document Reviewed: 10/07/2014 Elsevier Interactive Patient Education  2017 [employer].

## 2022-09-04 NOTE — Progress Notes (Addendum)
I connected with  Latoya Rios on 09/04/22 by a audio enabled telemedicine application and verified that I am speaking with the correct person using two identifiers.  Patient Location: Home  Provider Location: Home Office  I discussed the limitations of evaluation and management by telemedicine. The patient expressed understanding and agreed to proceed.   Subjective:   Latoya Rios is a 66 y.o. female who presents for an Initial Medicare Annual Wellness Visit.  Review of Systems     Cardiac Risk Factors include: advanced age (>69mn, >>21women);dyslipidemia;hypertension;obesity (BMI >30kg/m2)     Objective:    Today's Vitals   09/04/22 1001  Weight: 202 lb (91.6 kg)   Body mass index is 38.17 kg/m.     09/04/2022   10:11 AM 04/02/2021    5:13 PM 10/26/2020   10:22 AM 06/24/2020    2:00 AM 06/23/2020    4:48 PM 06/09/2020    2:00 PM 06/08/2020    6:00 PM  Advanced Directives  Does Patient Have a Medical Advance Directive? Yes No No No No  Yes  [place]   Healthcare Power of [place] Does patient want to make changes to medical advance directive?    No - Patient declined  No - Patient declined   Copy of [place] Chart? No - copy requested   No - copy requested     Would patient like information on creating a medical advance directive?    No - Patient declined       Current Medications (verified) Outpatient Encounter Medications as of 09/04/2022  Medication Sig   allopurinol (ZYLOPRIM) 100 MG tablet TAKE 1 TABLET BY MOUTH DAILY Oral Once a day for 30 days   carvedilol (COREG) 3.125 MG tablet Take 1 tablet (3.125 mg total) by mouth 2 (two) times daily.   Cholecalciferol (VITAMIN D3) 10 MCG (400 UNIT) CAPS Take by mouth in the morning and at bedtime.   furosemide (LASIX) 40 MG tablet Take 1 tablet (40 mg total) by mouth once a week.   Multiple Vitamin  (MULTIVITAMIN) tablet Take 1 tablet by mouth daily.   nitroGLYCERIN (NITROSTAT) 0.4 MG SL tablet PLACE 1 TABLET UNDER THE TONGUE EVERY 5 MINUTES AS NEEDED NOT TO EXCEED 3 IN 15 MINUTES   potassium chloride (KLOR-CON) 10 MEQ tablet Take 1 tablet (10 mEq total) by mouth once a week.   TART CHERRY PO Take 1 tablet by mouth daily.   valsartan (DIOVAN) 160 MG tablet Take 1 tablet (160 mg total) by mouth 2 (two) times daily.   [DISCONTINUED] TART CHERRY PO Take by mouth.   hydrALAZINE (APRESOLINE) 25 MG tablet Take 25 mg by mouth 3 (three) times daily. (Patient not taking: Reported on 09/04/2022)   [DISCONTINUED] allopurinol (ZYLOPRIM) 100 MG tablet Take 100 mg by mouth daily.   [DISCONTINUED] aspirin EC 81 MG EC tablet Take 1 tablet (81 mg total) by mouth daily. Swallow whole. (Patient not taking: Reported on 06/24/2022)   [DISCONTINUED] brimonidine (ALPHAGAN) 0.2 % ophthalmic solution 1 drop 2 (two) times daily.   [DISCONTINUED] lidocaine 4 % Place 1 patch onto the skin daily.   [DISCONTINUED] predniSONE (STERAPRED UNI-PAK 21 TAB) 10 MG (21) TBPK tablet Standard taper over 6 days. (Patient not taking: Reported on 06/24/2022)   [DISCONTINUED] triamterene-hydrochlorothiazide (MAXZIDE-25) 37.5-25 MG tablet TAKE 1 TABLET BY MOUTH DAILY. (Patient not taking: Reported on 06/24/2022)   No facility-administered  encounter medications on file as of 09/04/2022.    Allergies (verified) Clonidine, Nisoldipine, Penicillins, Quinacrine, and Quinapril   History: Past Medical History:  Diagnosis Date   Abnormal renal function 03/01/2019   Formatting of this note is different from the original. eGFR  >=60.0 mL/min/1.61m2 >60.0  31.2Low  CM  >60.0 CM     Last Assessment & Plan:  Formatting of this note might be different from the original. Resolved as of last labs Rechecking labs to ensure stability   Accelerated hypertension 06/08/2020   Acute cystitis without hematuria 06/24/2020   Acute hypoxemic respiratory  failure due to COVID-19 ([facility] 06/08/2020   Acute kidney injury ([facility] 06/23/2020   AKI (acute kidney injury) ([facility] 06/24/2020   ARF (acute renal failure) ([facility] 06/24/2020   Benign essential hypertension 05/29/2016   Last Assessment & Plan:  Formatting of this note is different from the original. Pertinent Data:   Current medication includes: carvediloL - 3.125 mg losartan-hydroCHLOROthiazide - 100-25 mg .  BP Readings from Last 3 Encounters:  02/29/20 (!) 206/86  08/31/19 (!) 142/78  08/16/19 (!) 160/56   LDL Calculated (mg/dL)  Date Value  08/31/2019 144 (H)  03/01/2019 156 (H)   Creatinine (mg/dL)  Date Val   CHF (congestive heart failure) (HCC)    Class 2 severe obesity due to excess calories with serious comorbidity and body mass index (BMI) of 37.0 to 37.9 in adult ([facility] 05/29/2016   Formatting of this note is different from the original. Wt Readings from Last 3 Encounters:  08/31/19 233 lb  08/16/19 239 lb  03/01/19 249 lb    Last Assessment & Plan:  Formatting of this note might be different from the original. BMI Assessment: Current Body mass index is 37.22 kg/m.  Patient BMI currently is above average (>25 kg/m2); BMI follow up plan is completed . Current barriers to heal   Demand ischemia 06/08/2020   Drug declined by patient 02/29/2020   Last Assessment & Plan:  Formatting of this note might be different from the original. Patient historically has declined statins and at some point was on losartan/HCTZ and carvedilol, but stopped taking them stating " I dont want to take medications ".  Today I again discussed with patient the risks of uncontrolled HYPERLIPIDEMIA and HYPERTENSION including but not limited to strokes, MIs, renal in   Encounter for preventative adult health care exam with abnormal findings 06/12/2017   Last Assessment & Plan:  Formatting of this note might be different from the original. Patient for wellness visit.  Overall doing good. No new concerns. Reviewed appropriate age  screenings and vaccinations.  Influenza vaccine: Patient Declined Tdap/TD: Patient Declined Zoster vaccine: Patient Declined  Colonoscopy: UTD, due 2022 Mammogram: UTD  Depression screening: negative No falls reported  Dis   Gynecologic exam normal 08/16/2019   Last Assessment & Plan:  Formatting of this note might be different from the original. Pap smear with high risk hpv collected for screening Discussed and encouraged healthy lifestyle.  Always use sunscreen when sun exposed.  Encouraged healthy diet with fresh fruits, vegetables, and lean meat.  Encouraged regular exercise for heart health and bone health.  Encouraged continue monthly SBE and yearl   Heart murmur    Hypertension    Hypotension due to drugs 06/22/2020   Mixed hyperlipidemia    Obese    Obesity (BMI 30-39.9) 06/08/2020   Pneumonia due to COVID-19 virus 06/08/2020   Past Surgical History:  Procedure Laterality Date   COLONOSCOPY  DENTAL SURGERY     Family History  Problem Relation Age of Onset   Emphysema Mother    Lung disease Mother    Brain cancer Father    Social History   Socioeconomic History   Marital status: Widowed    Spouse name: Not on file   Number of children: Not on file   Years of education: Not on file   Highest education level: Not on file  Occupational History   Not on file  Tobacco Use   Smoking status: Never   Smokeless tobacco: Never  Substance and Sexual Activity   Alcohol use: Never   Drug use: Never   Sexual activity: Not Currently  Other Topics Concern   Not on file  Social History Narrative   Not on file   Social Determinants of Health   Financial Resource Strain: Low Risk  (09/04/2022)   Overall Financial Resource Strain (CARDIA)    Difficulty of Paying Living Expenses: Not hard at all  Food Insecurity: No Food Insecurity (09/04/2022)   Hunger Vital Sign    Worried About Running Out of Food in the Last Year: Never true    [place] in the Last Year: Never true   Transportation Needs: No Transportation Needs (09/04/2022)   PRAPARE - Hydrologist (Medical): No    Lack of Transportation (Non-Medical): No  Physical Activity: Inactive (09/04/2022)   Exercise Vital Sign    Days of Exercise per Week: 0 days    Minutes of Exercise per Session: 0 min  Stress: No Stress Concern Present (09/04/2022)   [place]    Feeling of Stress : Not at all  Social Connections: Moderately Integrated (09/04/2022)   Social Connection and Isolation Panel [NHANES]    Frequency of Communication with Friends and Family: More than three times a week    Frequency of Social Gatherings with Friends and Family: More than three times a week    Attends Religious Services: More than 4 times per year    Active Member of [employer] or Organizations: Yes    Attends Archivist Meetings: 1 to 4 times per year    Marital Status: Widowed    Tobacco Counseling Counseling given: Not Answered   Clinical Intake:  Pre-visit preparation completed: Yes  Pain : No/denies pain     BMI - recorded: 38.17 Nutritional Status: BMI > 30  Obese Nutritional Risks: None Diabetes: No  How often do you need to have someone help you when you read instructions, pamphlets, or other written materials from your doctor or pharmacy?: 1 - Never  Diabetic?no  Interpreter Needed?: No  Information entered by :: Charlott Rakes, LPN   Activities of Daily Living    09/04/2022   10:12 AM  In your present state of health, do you have any difficulty performing the following activities:  Hearing? 0  Vision? 0  Difficulty concentrating or making decisions? 0  Walking or climbing stairs? 0  Dressing or bathing? 0  Doing errands, shopping? 0  Preparing Food and eating ? N  Using the Toilet? N  In the past six months, have you accidently leaked urine? N  Do you have problems with loss of bowel  control? N  Managing your Medications? N  Managing your Finances? N  Housekeeping or managing your Housekeeping? N    Patient Care Team: Saguier, Iris Pert as PCP - General (Internal Medicine) Berniece Salines,  DO as PCP - Cardiology (Cardiology)  Indicate any recent Medical Services you may have received from other than [facility] providers in the past year (date may be approximate).     Assessment:   This is a routine wellness examination for [place].  Hearing/Vision screen Hearing Screening - Comments:: Pt denies any hearing issues  Vision Screening - Comments:: Pt will follow up with new provider   Dietary issues and exercise activities discussed: Current Exercise Habits: The patient does not participate in regular exercise at present   Goals Addressed             This Visit's Progress    Patient Stated       Walk more        Depression Screen    09/04/2022   10:09 AM 01/04/2022    9:12 AM  PHQ 2/9 Scores  PHQ - 2 Score 0 0    Fall Risk    09/04/2022   10:12 AM  [place] in the past year? 0  Number falls in past yr: 0  Injury with Fall? 0  Follow up Falls prevention discussed    [place]:  Any stairs in or around the home? No  If so, are there any without handrails? No  Home free of loose throw rugs in walkways, pet beds, electrical cords, etc? Yes  Adequate lighting in your home to reduce risk of falls? Yes   ASSISTIVE DEVICES UTILIZED TO PREVENT FALLS:  Life alert? No  Use of a cane, walker or w/c? Yes  Grab bars in the bathroom? No  Shower chair or bench in shower? No  Elevated toilet seat or a handicapped toilet? No   TIMED UP AND GO:  Was the test performed? No .   Cognitive Function:        09/04/2022   10:13 AM  6CIT Screen  What Year? 0 points  What month? 0 points  What time? 0 points  Count back from 20 0 points  Months in reverse 0 points  Repeat phrase 0 points  Total Score 0  points    Immunizations Immunization History  Administered Date(s) Administered   Tdap 07/25/2020    TDAP status: Up to date  Flu Vaccine status: Declined, Education has been provided regarding the importance of this vaccine but patient still declined. Advised may receive this vaccine at local pharmacy or Health Dept. Aware to provide a copy of the vaccination record if obtained from local pharmacy or Health Dept. Verbalized acceptance and understanding.  Pneumococcal vaccine status: Declined,  Education has been provided regarding the importance of this vaccine but patient still declined. Advised may receive this vaccine at local pharmacy or Health Dept. Aware to provide a copy of the vaccination record if obtained from local pharmacy or Health Dept. Verbalized acceptance and understanding.   Covid-19 vaccine status: Declined, Education has been provided regarding the importance of this vaccine but patient still declined. Advised may receive this vaccine at local pharmacy or Health Dept.or vaccine clinic. Aware to provide a copy of the vaccination record if obtained from local pharmacy or Health Dept. Verbalized acceptance and understanding.  Qualifies for Shingles Vaccine? Yes   Zostavax completed No   Shingrix Completed?: No.    Education has been provided regarding the importance of this vaccine. Patient has been advised to call insurance company to determine out of pocket expense if they have not yet received this vaccine. Advised may  also receive vaccine at local pharmacy or Health Dept. Verbalized acceptance and understanding.  Screening Tests Health Maintenance  Topic Date Due   COVID-19 Vaccine (1) Never done   Hepatitis C Screening  Never done   Zoster Vaccines- Shingrix (1 of 2) Never done   Pneumonia Vaccine 50+ Years old (1 - PCV) Never done   INFLUENZA VACCINE  Never done   DEXA SCAN  09/05/2023 (Originally 03/16/2021)   COLONOSCOPY (Pts 45-22yr Insurance coverage will need  to be confirmed)  09/05/2023 (Originally 08/27/2020)   [place]ic[place] Annual Wellness ([place]  09/05/2023   MAMMOGRAM  02/06/2024   DTaP/Tdap/Td (2 - Td or Tdap) 07/25/2030   HPV VACCINES  Aged Out    Health Maintenance  Health Maintenance Due  Topic Date Due   COVID-19 Vaccine (1) Never done   Hepatitis C Screening  Never done   Zoster Vaccines- Shingrix (1 of 2) Never done   Pneumonia Vaccine 66 Years old (1 - PCV) Never done   INFLUENZA VACCINE  Never done    Colorectal cancer screening: Type of screening: Colonoscopy. Completed 08/27/10. Repeat every 10 years declined   Mammogram status: Completed 02/05/22. Repeat every year  Bone density declined    Additional Screening:  Hepatitis C Screening: does qualify  Vision Screening: Recommended annual ophthalmology exams for early detection of glaucoma and other disorders of the eye. Is the patient up to date with their annual eye exam?  No  Who is the provider or what is the name of the office in which the patient attends annual eye exams? Will follow up with new provider  If pt is not established with a provider, would they like to be referred to a provider to establish care? No .   Dental Screening: Recommended annual dental exams for proper oral hygiene  Community Resource Referral / Chronic Care Management: CRR required this visit?  No   CCM required this visit?  No      Plan:will follow upwith new provider      I have personally reviewed and noted the following in the patient's chart:   Medical and social history Use of alcohol, tobacco or illicit drugs  Current medications and supplements including opioid prescriptions. Patient is not currently taking opioid prescriptions. Functional ability and status Nutritional status Physical activity Advanced directives List of other physicians Hospitalizations, surgeries, and ER visits in previous 12 months Vitals Screenings to include cognitive, depression, and  falls Referrals and appointments  In addition, I have reviewed and discussed with patient certain preventive protocols, quality metrics, and best practice recommendations. A written personalized care plan for preventive services as well as general preventive health recommendations were provided to patient.     TWillette Brace LPN   166/02/44  Nurse Notes: none    Review and Agree with assessment & plan of LPN   EMackie Pai PA-C

## 2022-09-05 ENCOUNTER — Encounter: Payer: Self-pay | Admitting: Cardiology

## 2022-09-05 ENCOUNTER — Ambulatory Visit: Payer: Medicare PPO | Attending: Cardiology | Admitting: Cardiology

## 2022-09-05 ENCOUNTER — Other Ambulatory Visit: Payer: Self-pay | Admitting: Cardiology

## 2022-09-05 VITALS — BP 180/90 | HR 68 | Ht 61.0 in | Wt 211.2 lb

## 2022-09-05 DIAGNOSIS — E782 Mixed hyperlipidemia: Secondary | ICD-10-CM

## 2022-09-05 DIAGNOSIS — I5032 Chronic diastolic (congestive) heart failure: Secondary | ICD-10-CM

## 2022-09-05 DIAGNOSIS — I10 Essential (primary) hypertension: Secondary | ICD-10-CM

## 2022-09-05 MED ORDER — HYDRALAZINE HCL 50 MG PO TABS: 50.0000 mg | ORAL_TABLET | Freq: Two times a day (BID) | ORAL | 3 refills | Status: AC

## 2022-09-05 MED ORDER — HYDRALAZINE HCL 50 MG PO TABS: 50.0000 mg | ORAL_TABLET | Freq: Two times a day (BID) | ORAL | 3 refills | Status: DC

## 2022-09-05 MED ORDER — HYDRALAZINE HCL 50 MG PO TABS: 50.0000 mg | ORAL_TABLET | Freq: Two times a day (BID) | ORAL | 0 refills | Status: AC

## 2022-09-05 MED ORDER — HYDRALAZINE HCL 50 MG PO TABS: 50.0000 mg | ORAL_TABLET | Freq: Two times a day (BID) | ORAL | 0 refills | Status: DC

## 2022-09-05 NOTE — Progress Notes (Signed)
Cardiology Office Note:    Date:  09/07/2022   ID:  Latoya Rios, DOB 06/02/1956, MRN [id number]  PCP:  Saguier, Edward, PA-C  Cardiologist:  Kardie Tobb, DO  Electrophysiologist:  None   Referring MD: Saguier, Edward, PA-C   " I am ok"  History of Present Illness:    Latoya Rios is a 66 y.o. female with a hx of  hypertension here today for follow-up visit.I saw the patient in October 2021 at that time she was hypotensive therefore I decreased her hydralazine and stopped the Aldactone.    Her visit on July 21, 2020 the patient reported that she had been experiencing some dizziness and was concerned about this.  I placed a monitor on the patient and also had blood pressure cuff mailed to the patient and encouraged her to take her blood pressure sitting and standing.   I last saw the patient on August 14, 2020 at that time her blood pressure was elevated I increased her carvedilol to 6.25 mg twice a day.  We talked about her monitor results which show occasional PACs, PVCs as well as paroxysmal atrial tachycardia.  She also had questions about exercise as and we discussed this.   I last saw the patient via video visit on September 06, 2020 at that time she appeared to be doing well.  No changes were made in her medications.   I saw the patient December 05, 2019.  She reported continued chest discomfort.  We will schedule coronary CTA.-Patient was able to get a coronary CTA which did not show any evidence of coronary artery disease.  I saw the patient on 06/18/2021 at that time she was doing wel, she had slight bp elevation in the office but I reviewed her home bp which were within normal.   I saw the patient on February 28, 2022 at that time she experiencing some shoulder pain and muscle pain.  During that visit her heart rate was 44, I cut back on her carvedilol to 3.125 mg twice daily.  We talked about likely possibilities for pain relief.  At her visit on June 24, 2022 she had not  started hydralazine, stop losartan and started the patient on valsartan 80 mg twice a day.  Coreg was recommended 3.25 mg twice daily.  I encouraged the patient to start the hydralazine.  She offers no complaints at this time.  Not been able to pick up her hydralazine since I saw the patient.  In the interim I will increase her valsartan to 160 mg twice a day.  Past Medical History:  Diagnosis Date   Abnormal renal function 03/01/2019   Formatting of this note is different from the original. eGFR  >=60.0 mL/min/1.73m*2 >60.0  31.2Low  CM  >60.0 CM     Last Assessment & Plan:  Formatting of this note might be different from the original. Resolved as of last labs Rechecking labs to ensure stability   Accelerated hypertension 06/08/2020   Acute cystitis without hematuria 06/24/2020   Acute hypoxemic respiratory failure due to COVID-19 (HCC) 06/08/2020   Acute kidney injury (HCC) 06/23/2020   AKI (acute kidney injury) (HCC) 06/24/2020   ARF (acute renal failure) (HCC) 06/24/2020   Benign essential hypertension 05/29/2016   Last Assessment & Plan:  Formatting of this note is different from the original. Pertinent Data:   Current medication includes: carvediloL - 3.125 mg losartan-hydroCHLOROthiazide - 100-25 mg .  BP Readings from Last 3 Encounters:  02/29/20 (!) 206/86    08/31/19 (!) 142/78  08/16/19 (!) 160/56   LDL Calculated (mg/dL)  Date Value  08/31/2019 144 (H)  03/01/2019 156 (H)   Creatinine (mg/dL)  Date Val   CHF (congestive heart failure) (HCC)    Class 2 severe obesity due to excess calories with serious comorbidity and body mass index (BMI) of 37.0 to 37.9 in adult [facility]) 05/29/2016   Formatting of this note is different from the original. Wt Readings from Last 3 Encounters:  08/31/19 233 lb  08/16/19 239 lb  03/01/19 249 lb    Last Assessment & Plan:  Formatting of this note might be different from the original. BMI Assessment: Current Body mass index is 37.22 kg/m.  Patient BMI currently is above  average (>25 kg/m2); BMI follow up plan is completed . Current barriers to heal   Demand ischemia 06/08/2020   Drug declined by patient 02/29/2020   Last Assessment & Plan:  Formatting of this note might be different from the original. Patient historically has declined statins and at some point was on losartan/HCTZ and carvedilol, but stopped taking them stating " I dont want to take medications ".  Today I again discussed with patient the risks of uncontrolled HYPERLIPIDEMIA and HYPERTENSION including but not limited to strokes, MIs, renal in   Encounter for preventative adult health care exam with abnormal findings 06/12/2017   Last Assessment & Plan:  Formatting of this note might be different from the original. Patient for wellness visit.  Overall doing good. No new concerns. Reviewed appropriate age screenings and vaccinations.  Influenza vaccine: Patient Declined Tdap/TD: Patient Declined Zoster vaccine: Patient Declined  Colonoscopy: UTD, due 2022 Mammogram: UTD  Depression screening: negative No falls reported  Dis   Gynecologic exam normal 08/16/2019   Last Assessment & Plan:  Formatting of this note might be different from the original. Pap smear with high risk hpv collected for screening Discussed and encouraged healthy lifestyle.  Always use sunscreen when sun exposed.  Encouraged healthy diet with fresh fruits, vegetables, and lean meat.  Encouraged regular exercise for heart health and bone health.  Encouraged continue monthly SBE and yearl   Heart murmur    Hypertension    Hypotension due to drugs 06/22/2020   Mixed hyperlipidemia    Obese    Obesity (BMI 30-39.9) 06/08/2020   Pneumonia due to COVID-19 virus 06/08/2020    Past Surgical History:  Procedure Laterality Date   COLONOSCOPY     DENTAL SURGERY      Current Medications: Current Meds  Medication Sig   allopurinol (ZYLOPRIM) 100 MG tablet TAKE 1 TABLET BY MOUTH DAILY Oral Once a day for 30 days   carvedilol (COREG) 3.125 MG  tablet Take 1 tablet (3.125 mg total) by mouth 2 (two) times daily.   Cholecalciferol (VITAMIN D3) 10 MCG (400 UNIT) CAPS Take by mouth in the morning and at bedtime.   furosemide (LASIX) 40 MG tablet Take 1 tablet (40 mg total) by mouth once a week.   Multiple Vitamin (MULTIVITAMIN) tablet Take 1 tablet by mouth daily.   nitroGLYCERIN (NITROSTAT) 0.4 MG SL tablet PLACE 1 TABLET UNDER THE TONGUE EVERY 5 MINUTES AS NEEDED NOT TO EXCEED 3 IN 15 MINUTES   potassium chloride (KLOR-CON) 10 MEQ tablet Take 1 tablet (10 mEq total) by mouth once a week.   TART CHERRY PO Take 1 tablet by mouth daily.   valsartan (DIOVAN) 160 MG tablet Take 1 tablet (160 mg total) by mouth 2 (two) times  daily.   [DISCONTINUED] hydrALAZINE (APRESOLINE) 50 MG tablet Take 1 tablet (50 mg total) by mouth 2 (two) times daily.   [DISCONTINUED] hydrALAZINE (APRESOLINE) 50 MG tablet Take 1 tablet (50 mg total) by mouth 2 (two) times daily.     Allergies:   Clonidine, Nisoldipine, Penicillins, Quinacrine, and Quinapril   Social History   Socioeconomic History   Marital status: Widowed    Spouse name: Not on file   Number of children: Not on file   Years of education: Not on file   Highest education level: Not on file  Occupational History   Not on file  Tobacco Use   Smoking status: Never   Smokeless tobacco: Never  Substance and Sexual Activity   Alcohol use: Never   Drug use: Never   Sexual activity: Not Currently  Other Topics Concern   Not on file  Social History Narrative   Not on file   Social Determinants of Health   Financial Resource Strain: Low Risk  (09/04/2022)   Overall Financial Resource Strain (CARDIA)    Difficulty of Paying Living Expenses: Not hard at all  Food Insecurity: No Food Insecurity (09/04/2022)   Hunger Vital Sign    Worried About Running Out of Food in the Last Year: Never true    Ran Out of Food in the Last Year: Never true  Transportation Needs: No Transportation Needs  (09/04/2022)   PRAPARE - Transportation    Lack of Transportation (Medical): No    Lack of Transportation (Non-Medical): No  Physical Activity: Inactive (09/04/2022)   Exercise Vital Sign    Days of Exercise per Week: 0 days    Minutes of Exercise per Session: 0 min  Stress: No Stress Concern Present (09/04/2022)   [employer] - Occupational Stress Questionnaire    Feeling of Stress : Not at all  Social Connections: Moderately Integrated (09/04/2022)   Social Connection and Isolation Panel [NHANES]    Frequency of Communication with Friends and Family: More than three times a week    Frequency of Social Gatherings with Friends and Family: More than three times a week    Attends Religious Services: More than 4 times per year    Active Member of Clubs or Organizations: Yes    Attends Club or Organization Meetings: 1 to 4 times per year    Marital Status: Widowed     Family History: The patient's family history includes Brain cancer in her father; Emphysema in her mother; Lung disease in her mother.  ROS:   Review of Systems  Constitution: Negative for decreased appetite, fever and weight gain.  HENT: Negative for congestion, ear discharge, hoarse voice and sore throat.   Eyes: Negative for discharge, redness, vision loss in right eye and visual halos.  Cardiovascular: Negative for chest pain, dyspnea on exertion, leg swelling, orthopnea and palpitations.  Respiratory: Negative for cough, hemoptysis, shortness of breath and snoring.   Endocrine: Negative for heat intolerance and polyphagia.  Hematologic/Lymphatic: Negative for bleeding problem. Does not bruise/bleed easily.  Skin: Negative for flushing, nail changes, rash and suspicious lesions.  Musculoskeletal: Negative for arthritis, joint pain, muscle cramps, myalgias, neck pain and stiffness.  Gastrointestinal: Negative for abdominal pain, bowel incontinence, diarrhea and excessive appetite.   Genitourinary: Negative for decreased libido, genital sores and incomplete emptying.  Neurological: Negative for brief paralysis, focal weakness, headaches and loss of balance.  Psychiatric/Behavioral: Negative for altered mental status, depression and suicidal ideas.  Allergic/Immunologic: Negative for HIV   exposure and persistent infections.    EKGs/Labs/Other Studies Reviewed:    The following studies were reviewed today:   EKG:  The ekg ordered today demonstrates sinus bradycardia with arrhythmia and ventricular escape   TTE 05/2020 IMPRESSIONS   1. Left ventricular ejection fraction, by estimation, is 60 to 65%. The  left ventricle has normal function. The left ventricle has no regional  wall motion abnormalities. Left ventricular diastolic parameters are  consistent with Grade I diastolic  dysfunction (impaired relaxation).   2. Right ventricular systolic function is normal. The right ventricular  size is normal. Tricuspid regurgitation signal is inadequate for assessing  PA pressure.   3. The mitral valve is grossly normal. Mild mitral valve regurgitation.  No evidence of mitral stenosis.   4. The aortic valve is grossly normal. Aortic valve regurgitation is not  visualized. No aortic stenosis is present.   5. The inferior vena cava is normal in size with <50% respiratory  variability, suggesting right atrial pressure of 8 mmHg.   FINDINGS   Left Ventricle: Left ventricular ejection fraction, by estimation, is 60  to 65%. The left ventricle has normal function. The left ventricle has no  regional wall motion abnormalities. The left ventricular internal cavity  size was normal in size. There is   no left ventricular hypertrophy. Left ventricular diastolic parameters  are consistent with Grade I diastolic dysfunction (impaired relaxation).   Right Ventricle: The right ventricular size is normal. No increase in  right ventricular wall thickness. Right ventricular systolic  function is  normal. Tricuspid regurgitation signal is inadequate for assessing PA  pressure.   Left Atrium: Left atrial size was normal in size.   Right Atrium: Right atrial size was normal in size.   Pericardium: Trivial pericardial effusion is present. Presence of  pericardial fat pad.   Mitral Valve: The mitral valve is grossly normal. Mild mitral valve  regurgitation. No evidence of mitral valve stenosis.   Tricuspid Valve: The tricuspid valve is grossly normal. Tricuspid valve  regurgitation is trivial. No evidence of tricuspid stenosis.   Aortic Valve: The aortic valve is grossly normal. Aortic valve  regurgitation is not visualized. No aortic stenosis is present.   Pulmonic Valve: The pulmonic valve was grossly normal. Pulmonic valve  regurgitation is not visualized. No evidence of pulmonic stenosis.   Aorta: The aortic root is normal in size and structure.   Venous: The inferior vena cava is normal in size with less than 50%  respiratory variability, suggesting right atrial pressure of 8 mmHg.   IAS/Shunts: The atrial septum is grossly normal.        CCTA 03/12/2021 Aorta:  Normal size.  No calcifications.  No dissection.   Aortic Valve:  Trileaflet.  No calcifications.   Coronary Arteries:  Normal coronary origin.  Right dominance.   RCA is a large dominant artery that gives rise to PDA and PLA. There is no plaque.   Left main is a large artery that gives rise to LAD, Ramus intermedius and LCX arteries.   LAD is a large vessel that has no plaque.   Ramus with no plaques.   LCX is a non-dominant artery that gives rise to one large OM1 branch. There is no plaque.   Other findings:   Normal variant pulmonary vein (including right middle) all draining into the left atrium.   Normal left atrial appendage without a thrombus.   Normal size of the pulmonary artery.   IMPRESSION: 1. Coronary calcium score   of 0. This was 0 percentile for age and sex  matched control.   2. Normal coronary origin with right dominance.   3. No evidence of CAD. CAD-RADS 0. No evidence of CAD (0%). Consider non-atherosclerotic causes of chest pain.     Electronically Signed   By: Berniece Salines DO   On: 03/12/2021 17:07    Addended by Berniece Salines, DO on 03/12/2021  5:09 PM    Study Result   Narrative & Impression  EXAM: OVER-READ INTERPRETATION  CT CHEST   The following report is an over-read performed by radiologist Dr. Aletta Edouard of [facility], [place] on 03/12/2021. This over-read does not include interpretation of cardiac or coronary anatomy or pathology. The coronary CTA interpretation by the cardiologist is attached.   COMPARISON:  None.   FINDINGS: Vascular: No significant noncardiac vascular findings.   Mediastinum/Nodes: Visualized mediastinum and hilar regions demonstrate no lymphadenopathy or masses.   Lungs/Pleura: Visualized lungs show no evidence of pulmonary edema, consolidation, pneumothorax, nodule or pleural fluid.   Upper Abdomen: No acute abnormality.   Musculoskeletal: No chest wall mass or suspicious bone lesions identified.   IMPRESSION: No significant incidental findings.   Electronically Signed: By: Aletta Edouard M.D. On: 03/12/2021 16:06    Recent Labs: 11/28/2021: Hemoglobin 12.6; Platelets 296.0 03/01/2022: ALT 12; BUN 52; Creatinine, Ser 1.23; Potassium 4.4; Sodium 138  Recent Lipid Panel    Component Value Date/Time   TRIG 84 06/08/2020 0225    Physical Exam:    VS:  BP (!) 180/90 (BP Location: Right Arm)   Pulse 68   Ht 5' 1" (1.549 m)   Wt 211 lb 3.2 oz (95.8 kg)   SpO2 99%   BMI 39.91 kg/m     Wt Readings from Last 3 Encounters:  09/05/22 211 lb 3.2 oz (95.8 kg)  09/04/22 202 lb (91.6 kg)  06/24/22 212 lb 3.2 oz (96.3 kg)     GEN: Well nourished, well developed in no acute distress HEENT: Normal NECK: No JVD; No carotid bruits LYMPHATICS: No lymphadenopathy CARDIAC:  S1S2 noted,RRR, no murmurs, rubs, gallops RESPIRATORY:  Clear to auscultation without rales, wheezing or rhonchi  ABDOMEN: Soft, non-tender, non-distended, +bowel sounds, no guarding. EXTREMITIES:+2 lower extremity edema, No cyanosis, no clubbing MUSCULOSKELETAL:  No deformity  SKIN: Warm and dry NEUROLOGIC:  Alert and oriented x 3, non-focal PSYCHIATRIC:  Normal affect, good insight  ASSESSMENT:    1. Accelerated hypertension   2. Chronic diastolic congestive heart failure ([place])   3. Mixed hyperlipidemia   4. Morbid obesity ([place])      PLAN:    She is hypertensive in the office today.  I will start hydralazine 50 mg twice a day since she has had not been able to start this medication three times a day. Continue the valsartan 16m BID, coreg 3.125 mg BID.  She can benefit from cautious loop diuretic given her significant lower extremity swelling-Lasix 40 mg once weekly.  She also follow with nephro.  The patient is in agreement with the above plan. The patient left the office in stable condition.  The patient will follow up in 4 months or sooner if needed.   Medication Adjustments/Labs and Tests Ordered: Current medicines are reviewed at length with the patient today.  Concerns regarding medicines are outlined above.  No orders of the defined types were placed in this encounter.  Meds ordered this encounter  Medications   DISCONTD: hydrALAZINE (APRESOLINE) 50 MG tablet    Sig:  Take 1 tablet (50 mg total) by mouth 2 (two) times daily.    Dispense:  60 tablet    Refill:  0   DISCONTD: hydrALAZINE (APRESOLINE) 50 MG tablet    Sig: Take 1 tablet (50 mg total) by mouth 2 (two) times daily.    Dispense:  180 tablet    Refill:  3   DISCONTD: hydrALAZINE (APRESOLINE) 50 MG tablet    Sig: Take 1 tablet (50 mg total) by mouth 2 (two) times daily.    Dispense:  180 tablet    Refill:  3   hydrALAZINE (APRESOLINE) 50 MG tablet    Sig: Take 1 tablet (50 mg total) by mouth 2 (two)  times daily.    Dispense:  180 tablet    Refill:  3   hydrALAZINE (APRESOLINE) 50 MG tablet    Sig: Take 1 tablet (50 mg total) by mouth 2 (two) times daily.    Dispense:  60 tablet    Refill:  0    Patient Instructions  Medication Instructions:  Your physician has recommended you make the following change in your medication:  START: Hydralazine 50mg twice daily.  *If you need a refill on your cardiac medications before your next appointment, please call your pharmacy*   Lab Work: NONE If you have labs (blood work) drawn today and your tests are completely normal, you will receive your results only by: MyChart Message (if you have MyChart) OR A paper copy in the mail If you have any lab test that is abnormal or we need to change your treatment, we will call you to review the results.   Testing/Procedures: NONE   Follow-Up: At [facility], you and your health needs are our priority.  As part of our continuing mission to provide you with exceptional heart care, we have created designated Provider Care Teams.  These Care Teams include your primary Cardiologist (physician) and Advanced Practice Providers (APPs -  Physician Assistants and Nurse Practitioners) who all work together to provide you with the care you need, when you need it.  We recommend signing up for the patient portal called "MyChart".  Sign up information is provided on this After Visit Summary.  MyChart is used to connect with patients for Virtual Visits (Telemedicine).  Patients are able to view lab/test results, encounter notes, upcoming appointments, etc.  Non-urgent messages can be sent to your provider as well.   To learn more about what you can do with MyChart, go to [email or web address].    Your next appointment:   4 month(s)  The format for your next appointment:   In Person  Provider:   Kardie Tobb, DO     Adopting a Healthy Lifestyle.  Know what a healthy weight is for you (roughly BMI  <25) and aim to maintain this   Aim for 7+ servings of fruits and vegetables daily   65-80+ fluid ounces of water or unsweet tea for healthy kidneys   Limit to max 1 drink of alcohol per day; avoid smoking/tobacco   Limit animal fats in diet for cholesterol and heart health - choose grass fed whenever available   Avoid highly processed foods, and foods high in saturated/trans fats   Aim for low stress - take time to unwind and care for your mental health   Aim for 150 min of moderate intensity exercise weekly for heart health, and weights twice weekly for bone health   Aim for 7-9 hours of sleep daily     When it comes to diets, agreement about the perfect plan isnt easy to find, even among the experts. Experts at the [employer] developed an idea known as the Healthy Eating Plate. Just imagine a plate divided into logical, healthy portions.   The emphasis is on diet quality:   Load up on vegetables and fruits - one-half of your plate: Aim for color and variety, and remember that potatoes dont count.   Go for whole grains - one-quarter of your plate: Whole wheat, barley, wheat berries, quinoa, oats, brown rice, and foods made with them. If you want pasta, go with whole wheat pasta.   Protein power - one-quarter of your plate: Fish, chicken, beans, and nuts are all healthy, versatile protein sources. Limit red meat.   The diet, however, does go beyond the plate, offering a few other suggestions.   Use healthy plant oils, such as olive, canola, soy, corn, sunflower and peanut. Check the labels, and avoid partially hydrogenated oil, which have unhealthy trans fats.   If youre thirsty, drink water. Coffee and tea are good in moderation, but skip sugary drinks and limit milk and dairy products to one or two daily servings.   The type of carbohydrate in the diet is more important than the amount. Some sources of carbohydrates, such as vegetables, fruits, whole  grains, and beans-are healthier than others.   Finally, stay active  Signed, Kardie Tobb, DO  09/07/2022 12:35 PM    [facility] 

## 2022-09-05 NOTE — Patient Instructions (Signed)
Medication Instructions:  Your physician has recommended you make the following change in your medication:  START: Hydralazine 50mg  twice daily.  *If you need a refill on your cardiac medications before your next appointment, please call your pharmacy*   Lab Work: NONE If you have labs (blood work) drawn today and your tests are completely normal, you will receive your results only by: MyChart Message (if you have MyChart) OR A paper copy in the mail If you have any lab test that is abnormal or we need to change your treatment, we will call you to review the results.   Testing/Procedures: NONE   Follow-Up: At [facility], you and your health needs are our priority.  As part of our continuing mission to provide you with exceptional heart care, we have created designated Provider Care Teams.  These Care Teams include your primary Cardiologist (physician) and Advanced Practice Providers (APPs -  Physician Assistants and Nurse Practitioners) who all work together to provide you with the care you need, when you need it.  We recommend signing up for the patient portal called "MyChart".  Sign up information is provided on this After Visit Summary.  MyChart is used to connect with patients for Virtual Visits (Telemedicine).  Patients are able to view lab/test results, encounter notes, upcoming appointments, etc.  Non-urgent messages can be sent to your provider as well.   To learn more about what you can do with MyChart, go to [facility].    Your next appointment:   4 month(s)  The format for your next appointment:   In Person  Provider:   [email or web address], DO

## 2022-09-15 ENCOUNTER — Other Ambulatory Visit: Payer: Self-pay

## 2022-09-15 ENCOUNTER — Ambulatory Visit
Admission: EM | Admit: 2022-09-15 | Discharge: 2022-09-15 | Disposition: A | Discharge status: Home / Self Care | Payer: Medicare PPO | Attending: Emergency Medicine | Admitting: Emergency Medicine

## 2022-09-15 ENCOUNTER — Telehealth: Payer: Self-pay | Admitting: Urgent Care

## 2022-09-15 ENCOUNTER — Emergency Department (HOSPITAL_BASED_OUTPATIENT_CLINIC_OR_DEPARTMENT_OTHER)
Admission: EM | Admit: 2022-09-15 | Discharge: 2022-09-15 | Disposition: A | Discharge status: Home / Self Care | Payer: Medicare PPO | Attending: Emergency Medicine | Admitting: Emergency Medicine

## 2022-09-15 ENCOUNTER — Encounter (HOSPITAL_BASED_OUTPATIENT_CLINIC_OR_DEPARTMENT_OTHER): Payer: Self-pay

## 2022-09-15 DIAGNOSIS — T7840XA Allergy, unspecified, initial encounter: Secondary | ICD-10-CM

## 2022-09-15 DIAGNOSIS — E669 Obesity, unspecified: Secondary | ICD-10-CM | POA: Diagnosis not present

## 2022-09-15 DIAGNOSIS — I10 Essential (primary) hypertension: Secondary | ICD-10-CM | POA: Diagnosis not present

## 2022-09-15 DIAGNOSIS — T782XXA Anaphylactic shock, unspecified, initial encounter: Secondary | ICD-10-CM | POA: Insufficient documentation

## 2022-09-15 DIAGNOSIS — I509 Heart failure, unspecified: Secondary | ICD-10-CM | POA: Insufficient documentation

## 2022-09-15 DIAGNOSIS — I110 Hypertensive heart disease with heart failure: Secondary | ICD-10-CM | POA: Insufficient documentation

## 2022-09-15 DIAGNOSIS — T783XXA Angioneurotic edema, initial encounter: Secondary | ICD-10-CM

## 2022-09-15 MED ORDER — EPINEPHRINE 0.3 MG/0.3ML IJ SOAJ
0.3000 mg | Freq: Once | INTRAMUSCULAR | Status: AC
  Administered 2022-09-15: 0.3 mg via INTRAMUSCULAR
  Filled 2022-09-15: qty 0.3

## 2022-09-15 MED ORDER — EPINEPHRINE 0.3 MG/0.3ML IJ SOAJ: 0.3000 mg | INTRAMUSCULAR | 0 refills | Status: AC | PRN

## 2022-09-15 MED ORDER — METHYLPREDNISOLONE SODIUM SUCC 125 MG IJ SOLR
125.0000 mg | Freq: Once | INTRAMUSCULAR | Status: AC
  Administered 2022-09-15: 125 mg via INTRAMUSCULAR

## 2022-09-15 MED ORDER — POTASSIUM CHLORIDE CRYS ER 20 MEQ PO TBCR: 10.0000 meq | EXTENDED_RELEASE_TABLET | Freq: Once | ORAL | Status: DC

## 2022-09-15 MED ORDER — DIPHENHYDRAMINE HCL 25 MG PO CAPS
50.0000 mg | ORAL_CAPSULE | Freq: Once | ORAL | Status: AC
  Administered 2022-09-15: 50 mg via ORAL
  Filled 2022-09-15: qty 2

## 2022-09-15 MED ORDER — IRBESARTAN 150 MG PO TABS
150.0000 mg | ORAL_TABLET | Freq: Every day | ORAL | Status: DC
  Administered 2022-09-15: 150 mg via ORAL
  Filled 2022-09-15: qty 1

## 2022-09-15 MED ORDER — FUROSEMIDE 40 MG PO TABS
40.0000 mg | ORAL_TABLET | Freq: Once | ORAL | Status: AC
  Administered 2022-09-15: 40 mg via ORAL
  Filled 2022-09-15: qty 1

## 2022-09-15 MED ORDER — FAMOTIDINE IN NACL 20-0.9 MG/50ML-% IV SOLN
20.0000 mg | Freq: Once | INTRAVENOUS | Status: AC
  Administered 2022-09-15: 20 mg via INTRAVENOUS
  Filled 2022-09-15: qty 50

## 2022-09-15 MED ORDER — POTASSIUM CHLORIDE CRYS ER 20 MEQ PO TBCR
20.0000 meq | EXTENDED_RELEASE_TABLET | Freq: Once | ORAL | Status: AC
  Administered 2022-09-15: 20 meq via ORAL
  Filled 2022-09-15: qty 1

## 2022-09-15 MED ORDER — PREDNISONE 20 MG PO TABS: 40.0000 mg | ORAL_TABLET | Freq: Every day | ORAL | 0 refills | Status: AC

## 2022-09-15 MED ORDER — CARVEDILOL 6.25 MG PO TABS
3.1250 mg | ORAL_TABLET | Freq: Two times a day (BID) | ORAL | Status: DC
  Administered 2022-09-15: 3.125 mg via ORAL
  Filled 2022-09-15: qty 1

## 2022-09-15 MED ORDER — HYDRALAZINE HCL 25 MG PO TABS
50.0000 mg | ORAL_TABLET | Freq: Once | ORAL | Status: AC
  Administered 2022-09-15: 50 mg via ORAL
  Filled 2022-09-15: qty 2

## 2022-09-15 NOTE — ED Triage Notes (Signed)
Pt c/o generalized hives that appeared yesterday. The patient does state the rashes do itch and are burning.   Home interventions: benadryl (last taken today around 0230, 0215, and 0900- each dose 25 mg).  The patient states she has some SOB. No s/s of respiratory distress at this time.   Provider at bedside.

## 2022-09-15 NOTE — ED Notes (Signed)
Awaiting patients daughter to arrive.

## 2022-09-15 NOTE — ED Notes (Signed)
Hasn't taken antihypertensive today

## 2022-09-15 NOTE — Progress Notes (Signed)
Because of the widespread rash and concern for allergy, I feel your condition warrants further evaluation and I recommend that you be seen in a face to face visit. Additionally, a steroid injection will likely be needed and cannot be performed virtually.   NOTE: There will be NO CHARGE for this eVisit   If you are having a true medical emergency please call 911.      For an urgent face to face visit, [place] has seven urgent care centers for your convenience:     [facility] at [facility] Directions [phone] [address] [place], [place] [address]    [facility] Health [facility] Center [facility]) Get Driving Directions [phone] [address] [place], [place] [address]  [facility]) Get Driving Directions [phone] [address] [place],  [place]  [address]  [facility] Center [facility] - at [facility] Directions  [phone] [address] [place],  [place] [address]   [facility] at [facility] Get Driving Directions [phone] [address] [place], [place] [address]   [facility] Urgent Care at [facility] Get Driving Directions  [phone] [address] [place], [place] [address]   [facility] Urgent Care at [facility] Directions [phone] [address], [address] [place], [place] [address]  Your MyChart E-visit questionnaire answers were reviewed by a board certified advanced clinical practitioner to complete your personal care plan based on your specific symptoms.  Thank you for using e-Visits.

## 2022-09-15 NOTE — Discharge Instructions (Signed)
Please follow-up with your primary care doctor to ensure resolution of your rash, and voice hoarseness, please take the steroid course for the next several days that I prescribed, I have also prescribed you an EpiPen to take if you are worried that you may be having another allergic reaction in the future, your primary care doctor can help you to find allergy specialist to help elucidate what you may have had an allergic reaction to.

## 2022-09-15 NOTE — ED Provider Notes (Signed)
[facility] EMERGENCY DEPARTMENT Provider Note   CSN: [id number] Arrival date & time: 09/15/22  1225     History  No chief complaint on file.   Latoya Rios is a 66 y.o. female with past medical history significant for hypertension, obesity, CHF, hyperlipidemia who presents with concern for generalized itchy rash, hives, as well as some shortness of breath, and raspy voice.  Patient was seen and evaluated at urgent care told to go to the emergency department due to suspicion for possible anaphylactic reaction.  Patient with no known allergies, reports that she started using some Dial soap recently but only on her hands, denies any new detergents, reports that she ate [employer] at an unfamiliar location while traveling yesterday.  She denies any chest pain, sense of impending doom, nausea, vomiting, tongue swelling, sore throat.  HPI     Home Medications Prior to Admission medications   Medication Sig Start Date End Date Taking? Authorizing Provider  EPINEPHrine 0.3 mg/0.3 mL IJ SOAJ injection Inject 0.3 mg into the muscle as needed for anaphylaxis. 09/15/22  Yes Caytlin Better H, PA-C  predniSONE (DELTASONE) 20 MG tablet Take 2 tablets (40 mg total) by mouth daily. 09/15/22  Yes Elle Vezina H, PA-C  allopurinol (ZYLOPRIM) 100 MG tablet TAKE 1 TABLET BY MOUTH DAILY Oral Once a day for 30 days    [provider]  carvedilol (COREG) 3.125 MG tablet Take 1 tablet (3.125 mg total) by mouth 2 (two) times daily. 06/12/22   Tobb, Kardie, DO  Cholecalciferol (VITAMIN D3) 10 MCG (400 UNIT) CAPS Take by mouth in the morning and at bedtime.    [provider]  furosemide (LASIX) 40 MG tablet Take 1 tablet (40 mg total) by mouth once a week. 06/24/22   Tobb, Kardie, DO  hydrALAZINE (APRESOLINE) 50 MG tablet Take 1 tablet (50 mg total) by mouth 2 (two) times daily. 09/05/22   Tobb, Kardie, DO  hydrALAZINE (APRESOLINE) 50 MG tablet Take 1 tablet (50 mg total) by  mouth 2 (two) times daily. 09/05/22 12/04/22  Tobb, Lavona Mound, DO  Multiple Vitamin (MULTIVITAMIN) tablet Take 1 tablet by mouth daily.    [provider]  nitroGLYCERIN (NITROSTAT) 0.4 MG SL tablet PLACE 1 TABLET UNDER THE TONGUE EVERY 5 MINUTES AS NEEDED NOT TO EXCEED 3 IN 15 MINUTES 02/27/22   Tobb, Kardie, DO  potassium chloride (KLOR-CON) 10 MEQ tablet Take 1 tablet (10 mEq total) by mouth once a week. 06/24/22   Tobb, Kardie, DO  TART CHERRY PO Take 1 tablet by mouth daily.    [provider]  valsartan (DIOVAN) 160 MG tablet Take 1 tablet (160 mg total) by mouth 2 (two) times daily. 07/11/22   Tobb, Kardie, DO      Allergies    Clonidine, Nisoldipine, Penicillins, Quinacrine, and Quinapril    Review of Systems   Review of Systems  Respiratory:  Positive for shortness of breath.   Skin:  Positive for rash.  All other systems reviewed and are negative.   Physical Exam Updated Vital Signs BP (!) 173/77   Pulse 84   Temp 97.8 F (36.6 C) (Oral)   Resp 15   Ht 5\' 1"  (1.549 m)   Wt 96.2 kg   SpO2 94%   BMI 40.06 kg/m  Physical Exam Vitals and nursing note reviewed.  Constitutional:      General: She is not in acute distress.    Appearance: Normal appearance.  HENT:     Head:  Normocephalic and atraumatic.     Mouth/Throat:     Comments: Tongue is normal-appearing without edema, soft tissue in gums, palate also nonedematous, she does have a crowded posterior oropharynx with 2+ tonsils, Mallampati score of 3-4, but no evidence of erythema, uvular deviation, no stridor, and is able to swallow at this time. Eyes:     General:        Right eye: No discharge.        Left eye: No discharge.  Cardiovascular:     Rate and Rhythm: Normal rate and regular rhythm.     Heart sounds: No murmur heard.    No friction rub. No gallop.  Pulmonary:     Effort: Pulmonary effort is normal.     Breath sounds: Normal breath sounds.     Comments: No wheezing, stridor, rhonchi,  rales, no respiratory distress Abdominal:     General: Bowel sounds are normal.     Palpations: Abdomen is soft.  Skin:    General: Skin is warm and dry.     Capillary Refill: Capillary refill takes less than 2 seconds.     Comments: Patient with some generalized scattered hive versus nonspecific skin eruption rash, there is no sloughing, targetoid lesions, weeping purulence or signs of cellulitis noted.  Rash is mostly located near her arms but with scattered lesions around the abdomen, legs, chest.  Neurological:     Mental Status: She is alert and oriented to person, place, and time.  Psychiatric:        Mood and Affect: Mood normal.        Behavior: Behavior normal.     ED Results / Procedures / Treatments   Labs (all labs ordered are listed, but only abnormal results are displayed) Labs Reviewed - No data to display  EKG EKG Interpretation  Date/Time:  Sunday September 15 2022 14:55:51 EST Ventricular Rate:  83 PR Interval:  148 QRS Duration: 141 QT Interval:  452 QTC Calculation: 532 R Axis:   77 Text Interpretation: Sinus rhythm Left bundle branch block No significant change since last tracing Confirmed by Leanord Asal ([id number]) on 09/15/2022 2:58:54 PM  Radiology No results found.  Procedures Procedures    Medications Ordered in ED Medications  irbesartan (AVAPRO) tablet 150 mg (150 mg Oral Given 09/15/22 1503)  carvedilol (COREG) tablet 3.125 mg (3.125 mg Oral Given 09/15/22 1603)  EPINEPHrine (EPI-PEN) injection 0.3 mg (0.3 mg Intramuscular Given 09/15/22 1458)  famotidine (PEPCID) IVPB 20 mg premix (0 mg Intravenous Stopped 09/15/22 1533)  hydrALAZINE (APRESOLINE) tablet 50 mg (50 mg Oral Given 09/15/22 1503)  furosemide (LASIX) tablet 40 mg (40 mg Oral Given 09/15/22 1504)  diphenhydrAMINE (BENADRYL) capsule 50 mg (50 mg Oral Given 09/15/22 1522)  potassium chloride SA (KLOR-CON M) CR tablet 20 mEq (20 mEq Oral Given 09/15/22 1524)    ED Course/  Medical Decision Making/ A&P                           Medical Decision Making  This patient is a 66 y.o. female who presents to the ED for concern of rash, shortness of breath.   Differential diagnoses prior to evaluation: Simple skin eruption, psoriasis, eczema, other viral exanthem, versus anaphylaxis, especially with her shortness of breath think that a mild anaphylactic reaction is possible, she is not having any chest pain, respiratory distress, wheezing, nausea, vomiting, or sense of impending doom at this time  Past Medical History /  Social History / Additional history: Chart reviewed. Pertinent results include: hypertension, obesity, CHF, hyperlipidemia  Physical Exam: Physical exam performed. The pertinent findings include: Patient with generalized, nonspecific rash, some evidence of hives versus nonspecific skin eruption.  She is overall in the acute respiratory distress, without wheezing, endorse some shortness of breath on arrival that improved after administration of Pepcid, Benadryl, Lasix, epinephrine, her blood pressure is quite elevated, with hypertension 198/69 on arrival, she is not having any chest pain, somewhat improved after administration of her home meds, blood pressure 173/77 prior to discharge.  She did have 1 reading of soft blood pressure 110/53, but was not persistent, and rechecked with no evidence of developing shock.  Medications / Treatment: Administered epinephrine, Pepcid, home blood pressure medications, Benadryl.  Patient appears improved.  Will discharge with steroid burst.   Disposition: After consideration of the diagnostic results and the patients response to treatment, I feel that patient symptoms may be consistent with a mild or questionable anaphylactic reaction, difficult to isolate what agent may be responsible, she seems improved with no evidence of respiratory distress, shock, or endorgan damage/failure at time of discharge.  Rash is somewhat  improved.  Encouraged PCP follow-up for possible allergy testing and extensive return precautions given.   emergency department workup does not suggest an emergent condition requiring admission or immediate intervention beyond what has been performed at this time. The plan is: as above. The patient is safe for discharge and has been instructed to return immediately for worsening symptoms, change in symptoms or any other concerns. .    Final Clinical Impression(s) / ED Diagnoses Final diagnoses:  Anaphylaxis, initial encounter    Rx / DC Orders ED Discharge Orders          Ordered    EPINEPHrine 0.3 mg/0.3 mL IJ SOAJ injection  As needed        09/15/22 1747    predniSONE (DELTASONE) 20 MG tablet  Daily        09/15/22 1747              Anselmo Pickler, PA-C 09/15/22 [place], [place], DO 09/19/22 1457

## 2022-09-15 NOTE — Discharge Instructions (Signed)
Please go to the emergency room now for further evaluation and treatment.

## 2022-09-15 NOTE — ED Notes (Signed)
Rounding on patient No needs at this time. 

## 2022-09-15 NOTE — ED Triage Notes (Signed)
States started having a generalized itchy rash, denies trouble swallowing. States some shortness of breath. Given steroid shot at urgent care. States started using new soap recently, states symptoms started after eating [employer] yesterday.

## 2022-09-15 NOTE — ED Provider Notes (Addendum)
[facility]    CSN: [id number] Arrival date & time: 09/15/22  1020    HISTORY   Chief Complaint  Patient presents with   Allergic Reaction   Urticaria   HPI Latoya Rios is a pleasant, 66 y.o. female who presents to urgent care today. Patient states that after eating abdominals at 4:30 PM yesterday, she began to notice that her hands were itching and swelling about 2 hours after completing her meal.  Patient states she often eats at [employer] and did not order anything unusual.  On arrival today, patient's blood pressure is elevated, patient states she did not take her blood pressure medicine this morning because she was not sure if valsartan, which she started about a month ago, caused the allergic reaction.  Patient denies known history of anaphylactic allergy to any known foods or medications but does report a history of reaction to penicillin which is "welps".  Patient also has quinapril listed in her chart as an allergy but the reaction is unknown.  Patient states around 6:30 PM last night she began taking Benadryl 25 mg, take a second dose 6 hours after that and took her third dose around 9:30 AM today.  Patient states initially the Benadryl helped but to be evaluated when she noticed that the rash began to spread to her abdomen, forearms and lower extremities.  Patient states the rash tingles and burns, states she has never had a similar rash in the past.  Patient reports feeling short of breath this time, oxygen saturation is 98% on arrival with normal respirations.  Patient appears to be in no acute distress.  The history is provided by the patient.   Past Medical History:  Diagnosis Date   Abnormal renal function 03/01/2019   Formatting of this note is different from the original. eGFR  >=60.0 mL/min/1.6m2 >60.0  31.2Low  CM  >60.0 CM     Last Assessment & Plan:  Formatting of this note might be different from the original. Resolved as of last labs Rechecking labs to  ensure stability   Accelerated hypertension 06/08/2020   Acute cystitis without hematuria 06/24/2020   Acute hypoxemic respiratory failure due to COVID-19 ([facility] 06/08/2020   [place] [place] [facility] [place] 06/23/2020   [facility] 06/24[facility]   ARF (acute renal failure) ([facility] 06/24/2020   Benign essential hypertension 05/29/2016   Last Assessment & Plan:  Formatting of this note is different from the original. Pertinent Data:   Current medication includes: carvediloL - 3.125 mg losartan-hydroCHLOROthiazide - 100-25 mg .  BP Readings from Last 3 Encounters:  02/29/20 (!) 206/86  08/31/19 (!) 142/78  08/16/19 (!) 160/56   LDL Calculated (mg/dL)  Date Value  08/31/2019 144 (H)  03/01/2019 156 (H)   Creatinine (mg/dL)  Date Val   CHF (congestive heart failure) (HCC)    Class 2 severe obesity due to excess calories with serious comorbidity and body mass index (BMI) of 37.0 to 37.9 in adult ([facility] 05/29/2016   Formatting of this note is different from the original. Wt Readings from Last 3 Encounters:  08/31/19 233 lb  08/16/19 239 lb  03/01/19 249 lb    Last Assessment & Plan:  Formatting of this note might be different from the original. BMI Assessment: Current Body mass index is 37.22 kg/m.  Patient BMI currently is above average (>25 kg/m2); BMI follow up plan is completed . Current barriers to heal   Demand ischemia 06/08/2020   Drug declined by  patient 02/29/2020   Last Assessment & Plan:  Formatting of this note might be different from the original. Patient historically has declined statins and at some point was on losartan/HCTZ and carvedilol, but stopped taking them stating " I dont want to take medications ".  Today I again discussed with patient the risks of uncontrolled HYPERLIPIDEMIA and HYPERTENSION including but not limited to strokes, MIs, renal in   Encounter for preventative adult health care exam with abnormal findings 06/12/2017   Last Assessment & Plan:  Formatting of this note might  be different from the original. Patient for wellness visit.  Overall doing good. No new concerns. Reviewed appropriate age screenings and vaccinations.  Influenza vaccine: Patient Declined Tdap/TD: Patient Declined Zoster vaccine: Patient Declined  Colonoscopy: UTD, due 2022 Mammogram: UTD  Depression screening: negative No falls reported  Dis   Gynecologic exam normal 08/16/2019   Last Assessment & Plan:  Formatting of this note might be different from the original. Pap smear with high risk hpv collected for screening Discussed and encouraged healthy lifestyle.  Always use sunscreen when sun exposed.  Encouraged healthy diet with fresh fruits, vegetables, and lean meat.  Encouraged regular exercise for heart health and bone health.  Encouraged continue monthly SBE and yearl   Heart murmur    Hypertension    Hypotension due to drugs 06/22/2020   Mixed hyperlipidemia    Obese    Obesity (BMI 30-39.9) 06/08/2020   Pneumonia due to COVID-19 virus 06/08/2020   Patient Active Problem List   Diagnosis Date Noted   Morbid obesity ([address]) 06/18/2021   Acute cystitis without hematuria 06/24/2020   ARF (acute renal failure) ([place]) 06/24/2020   AKI (acute kidney injury) ([place]) 06/24/2020   Acute kidney injury ([place]) 06/23/2020   Hypotension due to drugs 06/22/2020   CHF (congestive heart failure) (HCC)    Heart murmur    Hypertension    Obese    Acute hypoxemic respiratory failure due to COVID-19 ([place]) 06/08/2020   Accelerated hypertension 06/08/2020   Obesity (BMI 30-39.9) 06/08/2020   Demand ischemia 06/08/2020   Drug declined by patient 02/29/2020   Gynecologic exam normal 08/16/2019   Abnormal renal function 03/01/2019   Encounter for preventative adult health care exam with abnormal findings 06/12/2017   Mixed hyperlipidemia 05/29/2016   Class 2 severe obesity due to excess calories with serious comorbidity and body mass index (BMI) of 37.0 to 37.9 in adult [facility]) 05/29/2016   Past Surgical  History:  Procedure Laterality Date   COLONOSCOPY     DENTAL SURGERY     OB History     Gravida  1   Para  1   Term      Preterm  1   AB      Living  1      SAB      IAB      Ectopic      Multiple      Live Births  1          Home Medications    Prior to Admission medications   Medication Sig Start Date End Date Taking? Authorizing Provider  allopurinol (ZYLOPRIM) 100 MG tablet TAKE 1 TABLET BY MOUTH DAILY Oral Once a day for 30 days    [provider]  carvedilol (COREG) 3.125 MG tablet Take 1 tablet (3.125 mg total) by mouth 2 (two) times daily. 06/12/22   Tobb, Kardie, DO  Cholecalciferol (VITAMIN D3) 10 MCG (400 UNIT) CAPS  Take by mouth in the morning and at bedtime.    [provider]  furosemide (LASIX) 40 MG tablet Take 1 tablet (40 mg total) by mouth once a week. 06/24/22   Tobb, Kardie, DO  hydrALAZINE (APRESOLINE) 50 MG tablet Take 1 tablet (50 mg total) by mouth 2 (two) times daily. 09/05/22   Tobb, Kardie, DO  hydrALAZINE (APRESOLINE) 50 MG tablet Take 1 tablet (50 mg total) by mouth 2 (two) times daily. 09/05/22 12/04/22  Tobb, Godfrey Pick, DO  Multiple Vitamin (MULTIVITAMIN) tablet Take 1 tablet by mouth daily.    [provider]  nitroGLYCERIN (NITROSTAT) 0.4 MG SL tablet PLACE 1 TABLET UNDER THE TONGUE EVERY 5 MINUTES AS NEEDED NOT TO EXCEED 3 IN 15 MINUTES 02/27/22   Tobb, Kardie, DO  potassium chloride (KLOR-CON) 10 MEQ tablet Take 1 tablet (10 mEq total) by mouth once a week. 06/24/22   Tobb, Kardie, DO  TART CHERRY PO Take 1 tablet by mouth daily.    [provider]  valsartan (DIOVAN) 160 MG tablet Take 1 tablet (160 mg total) by mouth 2 (two) times daily. 07/11/22   Berniece Salines, DO    Family History Family History  Problem Relation Age of Onset   Emphysema Mother    Lung disease Mother    Brain cancer Father    Social History Social History   Tobacco Use   Smoking status: Never   Smokeless tobacco: Never   Substance Use Topics   Alcohol use: Never   Drug use: Never   Allergies   Clonidine, Nisoldipine, Penicillins, Quinacrine, and Quinapril  Review of Systems Review of Systems Pertinent findings revealed after performing a 14 point review of systems has been noted in the history of present illness.  Physical Exam Triage Vital Signs ED Triage Vitals  Enc Vitals Group     BP 07/13/21 0827 (!) 147/82     Pulse Rate 07/13/21 0827 72     Resp 07/13/21 0827 18     Temp 07/13/21 0827 98.3 F (36.8 C)     Temp Source 07/13/21 0827 Oral     SpO2 07/13/21 0827 98 %     Weight --      Height --      Head Circumference --      Peak Flow --      Pain Score 07/13/21 0826 5     Pain Loc --      Pain Edu? --      Excl. in [place]? --   No data found.  Updated Vital Signs BP (!) 191/77 (BP Location: Left Arm)   Pulse (!) 101   Temp (!) 97.3 F (36.3 C) (Oral)   Resp 18   SpO2 98%   Physical Exam Vitals and nursing note reviewed.  Constitutional:      General: She is not in acute distress.    Appearance: Normal appearance.  HENT:     Head: Normocephalic and atraumatic.  Eyes:     Pupils: Pupils are equal, round, and reactive to light.  Cardiovascular:     Rate and Rhythm: Normal rate and regular rhythm.  Pulmonary:     Effort: Pulmonary effort is normal. No respiratory distress.     Breath sounds: Normal breath sounds. No stridor. No wheezing, rhonchi or rales.  Chest:     Chest wall: No tenderness.  Musculoskeletal:        General: Normal range of motion.     Cervical back: Normal range  of motion and neck supple.  Skin:    General: Skin is warm and dry.     Findings: Rash (Diffuse hives on hands, forearm, abdomen, buttocks, upper thighs, back) present.  Neurological:     General: No focal deficit present.     Mental Status: She is alert and oriented to person, place, and time. Mental status is at baseline.  Psychiatric:        Mood and Affect: Mood normal.         Behavior: Behavior normal.        Thought Content: Thought content normal.        Judgment: Judgment normal.     Visual Acuity Right Eye Distance:   Left Eye Distance:   Bilateral Distance:    Right Eye Near:   Left Eye Near:    Bilateral Near:     UC Couse / Diagnostics / Procedures:     Radiology No results found.  Procedures Procedures (including critical care time) EKG  Pending results:  Labs Reviewed - No data to display  Medications Ordered in UC: Medications  methylPREDNISolone sodium succinate (SOLU-MEDROL) 125 mg/2 mL injection 125 mg (125 mg Intramuscular Given 09/15/22 1100)    UC Diagnoses / Final Clinical Impressions(s)   I have reviewed the triage vital signs and the nursing notes.  Pertinent labs & imaging results that were available during my care of the patient were reviewed by me and considered in my medical decision making (see chart for details).    Final diagnoses:  Allergic reaction, initial encounter  Angioedema, initial encounter   Patient was provided with an injection of Solu-Medrol 125 mg during her visit today, rash continues to spread to her face and her face is also now red appearing.  Vital signs remained stable at this time, patient continues to [facility] well and has patent airway.  Patient advised I recommend going to the emergency room, patient politely declined transport via EMS.  Patient contacted her daughter by phone, I personally spoke to patient's daughter.  Daughter agrees to come pick her up from urgent care and transport her to the emergency room now.  Please see discharge instructions below for details of plan of care as provided to patient. ED Prescriptions   None    PDMP not reviewed this encounter.  Pending results:  Labs Reviewed - No data to display  Discharge Instructions:   Discharge Instructions      Please go to the emergency room now for further evaluation and treatment.      Disposition Upon  Discharge:  Condition: stable for discharge home  Patient presented with an acute illness with associated systemic symptoms and significant discomfort requiring urgent management. In my opinion, this is a condition that a prudent lay person (someone who possesses an average knowledge of health and medicine) may potentially expect to result in complications if not addressed urgently such as respiratory distress, impairment of bodily function or dysfunction of bodily organs.   Routine symptom specific, illness specific and/or disease specific instructions were discussed with the patient and/or caregiver at length.   As such, the patient has been evaluated and assessed, work-up was performed and treatment was provided in alignment with urgent care protocols and evidence based medicine.  Patient/parent/caregiver has been advised that the patient may require follow up for further testing and treatment if the symptoms continue in spite of treatment, as clinically indicated and appropriate.  Patient/parent/caregiver has been advised to return to the [facility] or PCP  if no better; to PCP or the Emergency Department if new signs and symptoms develop, or if the current signs or symptoms continue to change or worsen for further workup, evaluation and treatment as clinically indicated and appropriate  The patient will follow up with their current PCP if and as advised. If the patient does not currently have a PCP we will assist them in obtaining one.   The patient may need specialty follow up if the symptoms continue, in spite of conservative treatment and management, for further workup, evaluation, consultation and treatment as clinically indicated and appropriate.  Patient/parent/caregiver verbalized understanding and agreement of plan as discussed.  All questions were addressed during visit.  Please see discharge instructions below for further details of plan.  This office note has been dictated using [employer], museum/gallery.  Unfortunately, this method of dictation can sometimes lead to typographical or grammatical errors.  I apologize for your inconvenience in advance if this occurs.  Please do not hesitate to reach out to me if clarification is needed.      Lynden Oxford Scales, PA-C 09/15/22 1140    Lynden Oxford [place], [place] 09/15/22 1141

## 2022-09-25 DIAGNOSIS — M79672 Pain in left foot: Secondary | ICD-10-CM | POA: Diagnosis not present

## 2022-09-25 DIAGNOSIS — M2540 Effusion, unspecified joint: Secondary | ICD-10-CM | POA: Diagnosis not present

## 2022-09-25 DIAGNOSIS — M109 Gout, unspecified: Secondary | ICD-10-CM | POA: Diagnosis not present

## 2022-09-25 DIAGNOSIS — M79671 Pain in right foot: Secondary | ICD-10-CM | POA: Diagnosis not present

## 2022-09-26 ENCOUNTER — Ambulatory Visit (INDEPENDENT_AMBULATORY_CARE_PROVIDER_SITE_OTHER): Payer: Medicare Other | Admitting: Medical

## 2022-09-26 VITALS — BP 130/82 | HR 65 | Resp 18 | Ht 61.0 in | Wt 207.0 lb

## 2022-09-26 DIAGNOSIS — M25512 Pain in left shoulder: Secondary | ICD-10-CM | POA: Diagnosis not present

## 2022-09-26 DIAGNOSIS — M25511 Pain in right shoulder: Secondary | ICD-10-CM | POA: Diagnosis not present

## 2022-09-26 DIAGNOSIS — T7840XA Allergy, unspecified, initial encounter: Secondary | ICD-10-CM | POA: Diagnosis not present

## 2022-09-26 NOTE — Patient Instructions (Addendum)
For allergic reaction severe/anaphylactic like placed referral to allergist. Keep epipen with you and use in event of anaphylaxis and  be seen in ED. Call allergist within a week if they don't call you.  For bilateral shoulder pain placed referral to sports med.   Htn- bp well controlled  Follow up 3 months or sooner if needed.

## 2022-09-26 NOTE — Progress Notes (Addendum)
Subjective:    Patient ID: Latoya Rios, female    DOB: [birth year], 67 y.o.   MRN: [id number]  HPI Pt in for follow up from the ED on 09-15-2022.  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 The patient states that she had used a different soap yesterday and ate something different at [employer] yesterday and then started to develop a full-body rash. She states that it started as itching on her palms and then spread to her arms and chest. She reported mild shortness of breath and hoarse voice. She is taking Benadryl at home with no significant improvement. She was given steroids urgent care and sent to the ED with concern for anaphylaxis. She is hemodynamically stable here with no wheezing on exam and no respiratory distress. She had no swelling to the oropharynx. She had a mildly hoarse voice. She did diffuse hives along her waistband, on her chest, back and arms. She is due for another dose of Benadryl and will be given a dose of EpiPen. She will be started on a short steroid taper. She was observed for any worsening of her symptoms and no further signs of anaphylaxis and was stable for discharge home."    Pt told ED MD some Dial soap recently but only on her hands, denies any new detergents, reports that she ate [employer] at an unfamiliar location while traveling yesterday.  She denies any chest pain, sense of impending doom, nausea, vomiting, tongue swelling, sore throat.  Medications / Treatment: Administered epinephrine, Pepcid, home blood pressure medications, Benadryl.  Patient appears improved.  Will discharge with steroid burst.    Disposition: After consideration of the diagnostic results and the patients response to treatment, I feel that patient symptoms may be consistent with a mild or questionable anaphylactic reaction,  difficult to isolate what agent may be responsible, she seems improved with no evidence of respiratory distress, shock, or endorgan damage/failure at time of discharge.  Rash is somewhat improved.  Encouraged PCP follow-up for possible allergy testing and extensive return precautions given.    Patient daughter did take pictures and she has obvious severe rash with hive appearance in some locations.   Pt reports did improve after ED evaluation. Itching and rash all cleared by September 20, 2022.   Pt did have some sob early on before went to the ED.  Pt has 3 epipens. One in purse and 2 at home.   Bilateral shoulder pain. Pt seeing rheumatologist for gout and working up for arthritis but specialist thinks not arthritis. Rt shoulder pain for months since last visit with me. Left upper arm pain that started since ED visit. More pain movement.    On review pt gfr and cr are now improved. Pt had been seeing nephrologist.   Pt and daughter wanted me to sign the DNR form.    Review of Systems  Constitutional:  Negative for chills, fatigue and fever.  Respiratory:  Negative for cough, chest tightness, shortness of breath and wheezing.   Cardiovascular:  Negative for chest pain and palpitations.  Musculoskeletal:        Bilateral shoulder pain. See hpi.  Skin:  Negative for rash.  Neurological:  Negative for dizziness, speech difficulty and light-headedness.  Hematological:  Negative for adenopathy. Does not bruise/bleed easily.  Psychiatric/Behavioral:  Negative for behavioral problems, decreased concentration and dysphoric mood.  Past Medical History:  Diagnosis Date   Abnormal renal function 03/01/2019   Formatting of this note is different from the original. eGFR  >=60.0 mL/min/1.27m*2 >60.0  31.2Low  CM  >60.0 CM     Last Assessment & Plan:  Formatting of this note might be different from the original. Resolved as of last labs Rechecking labs to ensure stability   Accelerated  hypertension 06/08/2020   Acute cystitis without hematuria 06/24/2020   Acute hypoxemic respiratory failure due to COVID-19 [facility]) 06/08/2020   Acute kidney injury ([place]) 06/23/2020   AKI (acute kidney injury) ([place]) 06/24/2020   ARF (acute renal failure) ([place] [place]) 06/24/2020   Benign essential hypertension 05/29/2016   Last Assessment & Plan:  Formatting of this note is different from the original. Pertinent Data:   Current medication includes: carvediloL - 3.125 mg losartan-hydroCHLOROthiazide - 100-25 mg .  BP Readings from Last 3 Encounters:  02/29/20 (!) 206/86  08/31/19 (!) 142/78  08/16/19 (!) 160/56   LDL Calculated (mg/dL)  Date Value  08/31/2019 144 (H)  03/01/2019 156 (H)   Creatinine (mg/dL)  Date Val   CHF (congestive heart failure) (HCC)    Class 2 severe obesity due to excess calories with serious comorbidity and body mass index (BMI) of 37.0 to 37.9 in adult [facility]) 05/29/2016   Formatting of this note is different from the original. Wt Readings from Last 3 Encounters:  08/31/19 233 lb  08/16/19 239 lb  03/01/19 249 lb    Last Assessment & Plan:  Formatting of this note might be different from the original. BMI Assessment: Current Body mass index is 37.22 kg/m.  Patient BMI currently is above average (>25 kg/m2); BMI follow up plan is completed . Current barriers to heal   Demand ischemia 06/08/2020   Drug declined by patient 02/29/2020   Last Assessment & Plan:  Formatting of this note might be different from the original. Patient historically has declined statins and at some point was on losartan/HCTZ and carvedilol, but stopped taking them stating " I dont want to take medications ".  Today I again discussed with patient the risks of uncontrolled HYPERLIPIDEMIA and HYPERTENSION including but not limited to strokes, MIs, renal in   Encounter for preventative adult health care exam with abnormal findings 06/12/2017   Last Assessment & Plan:  Formatting of this note might be different from the original.  Patient for wellness visit.  Overall doing good. No new concerns. Reviewed appropriate age screenings and vaccinations.  Influenza vaccine: Patient Declined Tdap/TD: Patient Declined Zoster vaccine: Patient Declined  Colonoscopy: UTD, due 2022 Mammogram: UTD  Depression screening: negative No falls reported  Dis   Gynecologic exam normal 08/16/2019   Last Assessment & Plan:  Formatting of this note might be different from the original. Pap smear with high risk hpv collected for screening Discussed and encouraged healthy lifestyle.  Always use sunscreen when sun exposed.  Encouraged healthy diet with fresh fruits, vegetables, and lean meat.  Encouraged regular exercise for heart health and bone health.  Encouraged continue monthly SBE and yearl   Heart murmur    Hypertension    Hypotension due to drugs 06/22/2020   Mixed hyperlipidemia    Obese    Obesity (BMI 30-39.9) 06/08/2020   Pneumonia due to COVID-19 virus 06/08/2020     Social History   Socioeconomic History   Marital status: Widowed    Spouse name: Not on file   Number of children: Not on file  Years of education: Not on file   Highest education level: Not on file  Occupational History   Not on file  Tobacco Use   Smoking status: Never   Smokeless tobacco: Never  Substance and Sexual Activity   Alcohol use: Never   Drug use: Never   Sexual activity: Not Currently  Other Topics Concern   Not on file  Social History Narrative   Not on file   Social Determinants of Health   Financial Resource Strain: Low Risk  (09/04/2022)   Overall Financial Resource Strain (CARDIA)    Difficulty of Paying Living Expenses: Not hard at all  Food Insecurity: No Food Insecurity (09/04/2022)   Hunger Vital Sign    Worried About Running Out of Food in the Last Year: Never true    Ran Out of Food in the Last Year: Never true  Transportation Needs: No Transportation Needs (09/04/2022)   PRAPARE - Administrator, Civil Service  (Medical): No    Lack of Transportation (Non-Medical): No  Physical Activity: Inactive (09/04/2022)   Exercise Vital Sign    Days of Exercise per Week: 0 days    Minutes of Exercise per Session: 0 min  Stress: No Stress Concern Present (09/04/2022)   [employer] of Occupational Health - Occupational Stress Questionnaire    Feeling of Stress : Not at all  Social Connections: Moderately Integrated (09/04/2022)   Social Connection and Isolation Panel [NHANES]    Frequency of Communication with Friends and Family: More than three times a week    Frequency of Social Gatherings with Friends and Family: More than three times a week    Attends Religious Services: More than 4 times per year    Active Member of [employer] or Organizations: Yes    Attends Banker Meetings: 1 to 4 times per year    Marital Status: Widowed  Intimate Partner Violence: Not At Risk (09/04/2022)   Humiliation, Afraid, Rape, and Kick questionnaire    Fear of Current or Ex-Partner: No    Emotionally Abused: No    Physically Abused: No    Sexually Abused: No    Past Surgical History:  Procedure Laterality Date   COLONOSCOPY     DENTAL SURGERY      Family History  Problem Relation Age of Onset   Emphysema Mother    Lung disease Mother    Brain cancer Father     Allergies  Allergen Reactions   Clonidine Other (See Comments)    unk   Nisoldipine Other (See Comments)    unk   Penicillins Other (See Comments)    welps   Quinacrine Other (See Comments)    unk   Quinapril Other (See Comments)    unk    Current Outpatient Medications on File Prior to Visit  Medication Sig Dispense Refill   allopurinol (ZYLOPRIM) 100 MG tablet TAKE 1 TABLET BY MOUTH DAILY Oral Once a day for 30 days     carvedilol (COREG) 3.125 MG tablet Take 1 tablet (3.125 mg total) by mouth 2 (two) times daily. 180 tablet 3   Cholecalciferol (VITAMIN D3) 10 MCG (400 UNIT) CAPS Take by mouth in the morning and at bedtime.      EPINEPHrine 0.3 mg/0.3 mL IJ SOAJ injection Inject 0.3 mg into the muscle as needed for anaphylaxis. 1 each 0   furosemide (LASIX) 40 MG tablet Take 1 tablet (40 mg total) by mouth once a week. 12 tablet 3  hydrALAZINE (APRESOLINE) 50 MG tablet Take 1 tablet (50 mg total) by mouth 2 (two) times daily. 180 tablet 3   hydrALAZINE (APRESOLINE) 50 MG tablet Take 1 tablet (50 mg total) by mouth 2 (two) times daily. 60 tablet 0   Multiple Vitamin (MULTIVITAMIN) tablet Take 1 tablet by mouth daily.     nitroGLYCERIN (NITROSTAT) 0.4 MG SL tablet PLACE 1 TABLET UNDER THE TONGUE EVERY 5 MINUTES AS NEEDED NOT TO EXCEED 3 IN 15 MINUTES 25 tablet 2   potassium chloride (KLOR-CON) 10 MEQ tablet Take 1 tablet (10 mEq total) by mouth once a week. 12 tablet 3   predniSONE (DELTASONE) 20 MG tablet Take 2 tablets (40 mg total) by mouth daily. 10 tablet 0   TART CHERRY PO Take 1 tablet by mouth daily.     valsartan (DIOVAN) 160 MG tablet Take 1 tablet (160 mg total) by mouth 2 (two) times daily. 180 tablet 2   No current facility-administered medications on file prior to visit.    BP 130/82   Pulse 65   Resp 18   Ht 5\' 1"  (1.549 m)   Wt 207 lb (93.9 kg)   SpO2 97%   BMI 39.11 kg/m        Objective:   Physical Exam  General Mental Status- Alert. General Appearance- Not in acute distress.   Skin General: Color- Normal Color. Moisture- Normal Moisture.  Neck Carotid Arteries- Normal color. Moisture- Normal Moisture. No carotid bruits. No JVD.  Chest and Lung Exam Auscultation: Breath Sounds:-Normal.  Cardiovascular Auscultation:Rythm- Regular. Murmurs & Other Heart Sounds:Auscultation of the heart reveals- No Murmurs.  Abdomen Inspection:-Inspeection Normal. Palpation/Percussion:Note:No mass. Palpation and Percussion of the abdomen reveal- Non Tender, Non Distended + BS, no rebound or guarding.  Neurologic Cranial Nerve exam:- CN III-XII intact(No nystagmus), symmetric  smile. Strength:- 5/5 equal and symmetric strength both upper and lower extremities.   Bilateral shoulder- decreased rom/abduction. Rt shoulder/humerus junction feel small lipoma like mass.     Assessment & Plan:   Patient Instructions  For allergic reaction severe/anaphylactic like placed referral to allergist. Keep epipen with you and use in event of anaphylaxis and  be seen in ED. Call allergist within a week if they don't call you.  For bilateral shoulder pain placed referral to sports med.   Htn- bp well controlled  Follow up 3 months or sooner if needed.   , PA-C    Time spent with patient today was  41 minutes which consisted of chart review, discussing diagnosis, work up, treatment, filling out dnr forms/discussing dnr status and documentation.

## 2022-09-26 NOTE — Addendum Note (Signed)
Addended by: Anabel Halon on: 09/26/2022 09:42 AM   Modules accepted: Level of Service

## 2022-10-09 ENCOUNTER — Encounter: Payer: Self-pay | Admitting: Cardiology

## 2022-10-09 ENCOUNTER — Ambulatory Visit: Payer: Medicare Other | Admitting: Family Medicine

## 2022-10-16 ENCOUNTER — Ambulatory Visit: Payer: Medicare Other | Admitting: Internal Medicine

## 2022-10-16 ENCOUNTER — Encounter: Payer: Self-pay | Admitting: Family Medicine

## 2022-10-16 ENCOUNTER — Ambulatory Visit (INDEPENDENT_AMBULATORY_CARE_PROVIDER_SITE_OTHER): Payer: Medicare Other | Admitting: Family Medicine

## 2022-10-16 ENCOUNTER — Other Ambulatory Visit: Payer: Self-pay

## 2022-10-16 ENCOUNTER — Ambulatory Visit: Payer: Self-pay

## 2022-10-16 ENCOUNTER — Encounter: Payer: Self-pay | Admitting: Internal Medicine

## 2022-10-16 VITALS — BP 132/86 | HR 72 | Temp 98.6°F | Resp 16 | Ht 61.25 in | Wt 217.5 lb

## 2022-10-16 VITALS — BP 136/80 | Ht 61.0 in | Wt 217.0 lb

## 2022-10-16 DIAGNOSIS — J310 Chronic rhinitis: Secondary | ICD-10-CM

## 2022-10-16 DIAGNOSIS — J3089 Other allergic rhinitis: Secondary | ICD-10-CM

## 2022-10-16 DIAGNOSIS — H4089 Other specified glaucoma: Secondary | ICD-10-CM

## 2022-10-16 DIAGNOSIS — M7542 Impingement syndrome of left shoulder: Secondary | ICD-10-CM

## 2022-10-16 DIAGNOSIS — M25511 Pain in right shoulder: Secondary | ICD-10-CM

## 2022-10-16 DIAGNOSIS — M7501 Adhesive capsulitis of right shoulder: Secondary | ICD-10-CM | POA: Diagnosis not present

## 2022-10-16 DIAGNOSIS — L501 Idiopathic urticaria: Secondary | ICD-10-CM | POA: Diagnosis not present

## 2022-10-16 MED ORDER — FLUTICASONE PROPIONATE 50 MCG/ACT NA SUSP: 2.0000 | Freq: Every day | NASAL | 5 refills | Status: DC

## 2022-10-16 MED ORDER — FEXOFENADINE HCL 180 MG PO TABS: 180.0000 mg | ORAL_TABLET | Freq: Two times a day (BID) | ORAL | 5 refills | Status: DC | PRN

## 2022-10-16 MED ORDER — TRIAMCINOLONE ACETONIDE 40 MG/ML IJ SUSP
40.0000 mg | Freq: Once | INTRAMUSCULAR | Status: AC
  Administered 2022-10-16: 40 mg via INTRA_ARTICULAR

## 2022-10-16 MED ORDER — FAMOTIDINE 20 MG PO TABS: 20.0000 mg | ORAL_TABLET | Freq: Two times a day (BID) | ORAL | 5 refills | Status: AC | PRN

## 2022-10-16 NOTE — Assessment & Plan Note (Signed)
Acutely occurring.  She has a good range of motion of the left side. -Counseled on home exercise therapy and supportive care. -Could consider physical therapy, injection or further imaging.

## 2022-10-16 NOTE — Progress Notes (Signed)
NEW PATIENT  Date of Service/Encounter:  10/16/22  Consult requested by: Mackie Pai, PA-C   Subjective:   Latoya Rios (DOB: 07/18/1956) is a 67 y.o. female who presents to the clinic on 10/16/2022 with a chief complaint of Establish Care and Rash .    History obtained from: chart review and patient.   Allergic Reaction: Reports on 09/14/2022, she ate quarter pounder at [facility] and was holding a wrapper that had dye on it and also had some sweat tea. She wonders if the machines were not cleaned well and had mold in the tea.  She then noticed some hand itching but thought nothing of it.  They went home and about 1-2 hours later, developed hives diffusely.  Hives were itchy but no pain or scarring. She took a benadryl but still kept having the hives and also noted some shortness of breath.  She went to a local urgent care who told her to go to the ER. In the ER, she was noted to have diffuse hives, no oropharyngeal swelling, no wheezing/stridor/rhonchi/rales.  She was given Epi IM 0.3mg , Pepcid 20mg , Benadryl 50mg  and was discharged home with Epipen and Prednisone. Denies any illness or new medications. No other recurrence since then.  She does have a history of hives that would come and go for about 1 year in the early 1990s. They were never this severe.   Rhinitis:  Ongoing for a long time.  Symptoms include: nasal congestion, rhinorrhea, post nasal drainage, and sneezing  Occurs year-round Potential triggers: none Treatments tried:  Benadryl PRN  Previous allergy testing: no History of reflux/heartburn: no History of sinus surgery: no Nonallergic triggers: none   Past Medical History: Past Medical History:  Diagnosis Date   Abnormal renal function 03/01/2019   Formatting of this note is different from the original. eGFR  >=60.0 mL/min/1.92m*2 >60.0  31.2Low  CM  >60.0 CM     Last Assessment & Plan:  Formatting of this note might be different from the original. Resolved  as of last labs Rechecking labs to ensure stability   Accelerated hypertension 06/08/2020   Acute cystitis without hematuria 06/24/2020   Acute hypoxemic respiratory failure due to COVID-19 [facility]) 06/08/2020   Acute kidney injury ([place]) 06/23/2020   AKI (acute kidney injury) ([place]) 06/24/2020   ARF (acute renal failure) ([place]) 06/24/2020   Benign essential hypertension 05/29/2016   Last Assessment & Plan:  Formatting of this note is different from the original. Pertinent Data:   Current medication includes: carvediloL - 3.125 mg losartan-hydroCHLOROthiazide - 100-25 mg .  BP Readings from Last 3 Encounters:  02/29/20 (!) 206/86  08/31/19 (!) 142/78  08/16/19 (!) 160/56   LDL Calculated (mg/dL)  Date Value  08/31/2019 144 (H)  03/01/2019 156 (H)   Creatinine (mg/dL)  Date Val   CHF (congestive heart failure) (HCC)    Class 2 severe obesity due to excess calories with serious comorbidity and body mass index (BMI) of 37.0 to 37.9 in adult [facility]) 05/29/2016   Formatting of this note is different from the original. Wt Readings from Last 3 Encounters:  08/31/19 233 lb  08/16/19 239 lb  03/01/19 249 lb    Last Assessment & Plan:  Formatting of this note might be different from the original. BMI Assessment: Current Body mass index is 37.22 kg/m.  Patient BMI currently is above average (>25 kg/m2); BMI follow up plan is completed . Current barriers to heal   Demand ischemia 06/08/2020   Drug  declined by patient 02/29/2020   Last Assessment & Plan:  Formatting of this note might be different from the original. Patient historically has declined statins and at some point was on losartan/HCTZ and carvedilol, but stopped taking them stating " I dont want to take medications ".  Today I again discussed with patient the risks of uncontrolled HYPERLIPIDEMIA and HYPERTENSION including but not limited to strokes, MIs, renal in   Encounter for preventative adult health care exam with abnormal findings 06/12/2017   Last Assessment &  Plan:  Formatting of this note might be different from the original. Patient for wellness visit.  Overall doing good. No new concerns. Reviewed appropriate age screenings and vaccinations.  Influenza vaccine: Patient Declined Tdap/TD: Patient Declined Zoster vaccine: Patient Declined  Colonoscopy: UTD, due 2022 Mammogram: UTD  Depression screening: negative No falls reported  Dis   Gynecologic exam normal 08/16/2019   Last Assessment & Plan:  Formatting of this note might be different from the original. Pap smear with high risk hpv collected for screening Discussed and encouraged healthy lifestyle.  Always use sunscreen when sun exposed.  Encouraged healthy diet with fresh fruits, vegetables, and lean meat.  Encouraged regular exercise for heart health and bone health.  Encouraged continue monthly SBE and yearl   Heart murmur    Hypertension    Hypotension due to drugs 06/22/2020   Mixed hyperlipidemia    Obese    Obesity (BMI 30-39.9) 06/08/2020   Pneumonia due to COVID-19 virus 06/08/2020   Past Surgical History: Past Surgical History:  Procedure Laterality Date   COLONOSCOPY     DENTAL SURGERY      Family History: Family History  Problem Relation Age of Onset   Emphysema Mother    Lung disease Mother    Brain cancer Father     Social History:  Lives in a [address] year apartment [place] in bedroom: carpet Pets: none Tobacco use/exposure: none Job: retired  Medication List:  Allergies as of 10/16/2022       Reactions   Clonidine Other (See Comments)   unk   Nisoldipine Other (See Comments)   unk   Penicillins Other (See Comments)   welps   Quinacrine Other (See Comments)   unk   Quinapril Other (See Comments)   unk        Medication List        Accurate as of October 16, 2022 11:46 AM. If you have any questions, ask your nurse or doctor.          allopurinol 100 MG tablet Commonly known as: ZYLOPRIM TAKE 1 TABLET BY MOUTH DAILY Oral Once a day for 30 days    carvedilol 3.125 MG tablet Commonly known as: COREG Take 1 tablet (3.125 mg total) by mouth 2 (two) times daily.   EPINEPHrine 0.3 mg/0.3 mL Soaj injection Commonly known as: EPI-PEN Inject 0.3 mg into the muscle as needed for anaphylaxis.   furosemide 40 MG tablet Commonly known as: LASIX Take 1 tablet (40 mg total) by mouth once a week.   hydrALAZINE 50 MG tablet Commonly known as: APRESOLINE Take 1 tablet (50 mg total) by mouth 2 (two) times daily.   hydrALAZINE 50 MG tablet Commonly known as: APRESOLINE Take 1 tablet (50 mg total) by mouth 2 (two) times daily.   multivitamin tablet Take 1 tablet by mouth daily.   nitroGLYCERIN 0.4 MG SL tablet Commonly known as: NITROSTAT PLACE 1 TABLET UNDER THE TONGUE EVERY 5 MINUTES AS NEEDED  NOT TO EXCEED 3 IN 15 MINUTES   potassium chloride 10 MEQ tablet Commonly known as: KLOR-CON Take 1 tablet (10 mEq total) by mouth once a week.   predniSONE 20 MG tablet Commonly known as: DELTASONE Take 2 tablets (40 mg total) by mouth daily.   TART CHERRY PO Take 1 tablet by mouth daily.   valsartan 160 MG tablet Commonly known as: DIOVAN Take 1 tablet (160 mg total) by mouth 2 (two) times daily.   Vitamin D3 10 MCG (400 UNIT) Caps Take by mouth in the morning and at bedtime.         REVIEW OF SYSTEMS: Pertinent positives and negatives discussed in HPI.   Objective:   Physical Exam: BP 132/86 (BP Location: Left Arm, Patient Position: Sitting, Cuff Size: Large)   Pulse 72   Temp 98.6 F (37 C) (Temporal)   Resp 16   Ht 5' 1.25" (1.556 m)   Wt 217 lb 8 oz (98.7 kg)   SpO2 98%   BMI 40.76 kg/m  Body mass index is 40.76 kg/m. GEN: alert, well developed HEENT: clear conjunctiva, TM grey and translucent, nose with + inferior turbinate hypertrophy, pink nasal mucosa, no rhinorrhea, no  cobblestoning HEART: regular rate and rhythm, no murmur LUNGS: clear to auscultation bilaterally, no coughing, unlabored  respiration ABDOMEN: soft, non distended  SKIN: no rashes or lesions  Reviewed:  Daughter had pictures on her phone that show diffuse hives.   09/15/2022: [facility] seen for allergic reaction with urticaria. Given solumedrol and was told to go to the ER.    09/15/2022: seen at [facility] ED for concern for anaphylaxis. In the ER, she was noted to have diffuse hives, no oropharyngeal swelling, no wheezing/stridor/rhonchi/rales.  She was given Epi IM 0.3mg , Pepcid 20mg , Benadryl 50mg  and was discharged home with Epipen and Prednisone.  09/26/2022: seen by [facility] PA PCP for hospital follow up.  Questionable anaphylactic reaction without clear etiology.  Daughter had pictures which showed hives. No other symptoms afterwards.  She has Epipens.  Also followed by Rheumatology for gout and arthritis but they do not think this is autoimmune.   Skin Testing:  Skin prick testing was placed, which includes aeroallergens/foods, histamine control, and saline control.  Verbal consent was obtained prior to placing test.  Patient tolerated procedure well.  Allergy testing results were read and interpreted by myself, documented by clinical staff. Adequate positive and negative control.  Results discussed with patient/family.  Airborne Adult Perc - 10/16/22 0948     Time Antigen Placed 0945    Allergen Manufacturer Lavella Hammock    Location Back    Number of Test 58    1. Control-Buffer 50% Glycerol Negative    2. Control-Histamine 1 mg/ml 3+    3. Albumin saline Negative    4. [place] Negative    5. [place] Negative    6. Johnson Negative    7. Negley Blue Negative    9. Perennial Rye Negative    10. Sweet Vernal Negative    11. Timothy Negative    12. Cocklebur Negative    13. Burweed Marshelder Negative    14. Ragweed, short Negative    15. Ragweed, Giant Negative    16. Plantain,  English Negative    17. Lamb's Quarters Negative    18. Sheep Sorrell Negative    19. Rough Pigweed  Negative    20. Marsh Elder, Rough Negative    21. Mugwort, Common Negative  22. Ash mix Negative    23. Birch mix Negative    24. Beech American Negative    25. Box, Elder Negative    26. Cedar, red Negative    27. Cottonwood, Guinea[place] Negative    28. Elm mix Negative    29. Hickory Negative    30. Maple mix Negative    31. Oak, [place] mix Negative    32. Pecan Pollen 2+    33. Pine mix Negative    34. Sycamore Eastern Negative    35. Walnut, Black Pollen Negative    36. Alternaria alternata Negative    37. Cladosporium Herbarum Negative    38. Aspergillus mix Negative    39. Penicillium mix Negative    40. Bipolaris sorokiniana (Helminthosporium) Negative    41. Drechslera spicifera (Curvularia) Negative    42. Mucor plumbeus Negative    43. Fusarium moniliforme Negative    44. Aureobasidium pullulans (pullulara) Negative    45. Rhizopus oryzae Negative    46. Botrytis cinera Negative    47. Epicoccum nigrum Negative    48. Phoma betae Negative    49. Candida Albicans Negative    50. Trichophyton mentagrophytes Negative    51. Mite, D Farinae  5,000 AU/ml Negative    52. Mite, D Pteronyssinus  5,000 AU/ml Negative    53. Cat Hair 10,000 BAU/ml Negative    54.  Dog Epithelia Negative    55. Mixed Feathers Negative    56. Horse Epithelia Negative    57. Cockroach, German Negative    58. Mouse Negative    59. Tobacco Leaf Negative               Assessment:   1. Idiopathic urticaria   2. Chronic rhinitis   3. Other specified glaucoma, unspecified laterality     Plan/Recommendations:  Idiopathic Urticaria (Hives): - At this time etiology of hives and swelling is unknown. Hives can be caused by a variety of different triggers including illness/infection, exercise, pressure, vibrations, extremes of temperature to name a few however majority of the time there is no identifiable trigger. Discussed this is not related to you touching the [employer] wrapper with dye  or mold exposure.  -We will obtain labs to rule out inflammatory/autoimmune/alpha gal/mast cell causes. -Of note, she is followed by eye doctor for elevated IOP but no official diagnosis of glaucoma.  Discussed that benadryl has higher risk for glaucoma acute angle closures which is why I recommend using second generation anti histamine like Allegra.  Also benadryl is short acting.  -Start Allegra 180mg  daily.   -If hives reoccur, increase to Allegra 180mg  twice daily.   -If no improvement in 2-3 days, add Pepcid 20mg  twice daily and continue Allegra 180mg  twice daily.    Allergic Rhinitis: - Positive skin test 09/2022: pecan tree pollen  - Avoidance measures discussed. - Use nasal saline rinses before nose sprays such as with Neilmed Sinus Rinse.  Use distilled water.   - Use Flonase 2 sprays each nostril daily. Aim upward and outward. - Use Allegra 180mg  daily as needed.  Return in about 6 weeks (around 11/27/2022).  , MD [facility]

## 2022-10-16 NOTE — Patient Instructions (Signed)
Nice to meet you Please try heat for the shoulders  Please try the exercises   Please send me a message in MyChart with any questions or updates.  Please see me back in 4 weeks.   --Dr. Raeford Razor

## 2022-10-16 NOTE — Assessment & Plan Note (Signed)
Acutely occurring with limited external rotation.  Symptoms more consistent with a capsulitis versus synovitis.  She does have nonspecific inflammatory changes down in the soft tissue around the midshaft of the humerus. -Counseled on home exercise therapy and supportive care. -Injection today. -Could consider further lab work.

## 2022-10-16 NOTE — Patient Instructions (Addendum)
Idiopathic Urticaria (Hives): - At this time etiology of hives and swelling is unknown. Hives can be caused by a variety of different triggers including illness/infection, exercise, pressure, vibrations, extremes of temperature to name a few however majority of the time there is no identifiable trigger.  -We will obtain labs to rule out inflammatory/autoimmune/alpha gal/mast cell causes. -Start Allegra 180mg  daily.   -If hives reoccur, increase to Allegra 180mg  twice daily.   -If no improvement in 2-3 days, add Pepcid 20mg  twice daily and continue Allegra 180mg  twice daily.    Allergic Rhinitis: - Positive skin test 09/2022: pecan tree pollen  - Avoidance measures discussed. - Use nasal saline rinses before nose sprays such as with Neilmed Sinus Rinse.  Use distilled water.   - Use Flonase 2 sprays each nostril daily. Aim upward and outward. - Use Allegra 180mg  daily as needed.  Return in about 6 weeks (around 11/27/2022).

## 2022-10-16 NOTE — Progress Notes (Signed)
Latoya Rios - 67 y.o. female MRN [id number]  Date of birth: 01/14/1956  SUBJECTIVE:  Including CC & ROS.  No chief complaint on file.   Latoya Rios is a 67 y.o. female that is presenting with acute left and right shoulder pain.  The pain is severe in nature.  She does report a history of gout..    Review of Systems See HPI   HISTORY: Past Medical, Surgical, Social, and Family History Reviewed & Updated per EMR.   Pertinent Historical Findings include:  Past Medical History:  Diagnosis Date   Abnormal renal function 03/01/2019   Formatting of this note is different from the original. eGFR  >=60.0 mL/min/1.11m*2 >60.0  31.2Low  CM  >60.0 CM     Last Assessment & Plan:  Formatting of this note might be different from the original. Resolved as of last labs Rechecking labs to ensure stability   Accelerated hypertension 06/08/2020   Acute cystitis without hematuria 06/24/2020   Acute hypoxemic respiratory failure due to COVID-19 [facility]) 06/08/2020   Acute kidney injury ([place]) 06/23/2020   AKI (acute kidney injury) ([place]) 06/24/2020   ARF (acute renal failure) ([place]) 06/24/2020   Benign essential hypertension 05/29/2016   Last Assessment & Plan:  Formatting of this note is different from the original. Pertinent Data:   Current medication includes: carvediloL - 3.125 mg losartan-hydroCHLOROthiazide - 100-25 mg .  BP Readings from Last 3 Encounters:  02/29/20 (!) 206/86  08/31/19 (!) 142/78  08/16/19 (!) 160/56   LDL Calculated (mg/dL)  Date Value  08/31/2019 144 (H)  03/01/2019 156 (H)   Creatinine (mg/dL)  Date Val   CHF (congestive heart failure) (HCC)    Class 2 severe obesity due to excess calories with serious comorbidity and body mass index (BMI) of 37.0 to 37.9 in adult [facility]) 05/29/2016   Formatting of this note is different from the original. Wt Readings from Last 3 Encounters:  08/31/19 233 lb  08/16/19 239 lb  03/01/19 249 lb    Last Assessment & Plan:  Formatting of this note might be different  from the original. BMI Assessment: Current Body mass index is 37.22 kg/m.  Patient BMI currently is above average (>25 kg/m2); BMI follow up plan is completed . Current barriers to heal   Demand ischemia 06/08/2020   Drug declined by patient 02/29/2020   Last Assessment & Plan:  Formatting of this note might be different from the original. Patient historically has declined statins and at some point was on losartan/HCTZ and carvedilol, but stopped taking them stating " I dont want to take medications ".  Today I again discussed with patient the risks of uncontrolled HYPERLIPIDEMIA and HYPERTENSION including but not limited to strokes, MIs, renal in   Encounter for preventative adult health care exam with abnormal findings 06/12/2017   Last Assessment & Plan:  Formatting of this note might be different from the original. Patient for wellness visit.  Overall doing good. No new concerns. Reviewed appropriate age screenings and vaccinations.  Influenza vaccine: Patient Declined Tdap/TD: Patient Declined Zoster vaccine: Patient Declined  Colonoscopy: UTD, due 2022 Mammogram: UTD  Depression screening: negative No falls reported  Dis   Gynecologic exam normal 08/16/2019   Last Assessment & Plan:  Formatting of this note might be different from the original. Pap smear with high risk hpv collected for screening Discussed and encouraged healthy lifestyle.  Always use sunscreen when sun exposed.  Encouraged healthy diet with fresh fruits, vegetables, and lean meat.  Encouraged regular exercise for heart health and bone health.  Encouraged continue monthly SBE and yearl   Heart murmur    Hypertension    Hypotension due to drugs 06/22/2020   Mixed hyperlipidemia    Obese    Obesity (BMI 30-39.9) 06/08/2020   Pneumonia due to COVID-19 virus 06/08/2020    Past Surgical History:  Procedure Laterality Date   COLONOSCOPY     DENTAL SURGERY       PHYSICAL EXAM:  VS: BP 136/80   Ht 5\' 1"  (1.549 m)   Wt 217 lb  (98.4 kg)   BMI 41.00 kg/m  Physical Exam Gen: NAD, alert, cooperative with exam, well-appearing MSK:  Neurovascularly intact    Limited ultrasound: Right shoulder pain:  No changes of the bicipital tendon sheath. Normal-appearing subscapularis. No significant changes of the supraspinatus. There appears to be a double lumen line in the posterior glenohumeral joint. Area of fullness in the mid humerus shows nonspecific hyperemia within the soft tissue in this area.  Summary: Findings more consistent with a synovitis or capsulitis of the shoulder with nonspecific inflammation of the soft tissue within the arm.  Ultrasound and interpretation by Clearance Coots, MD   Aspiration/Injection Procedure Note Latoya Rios 09-10-1956  Procedure: Injection Indications: right shoulder pain  Procedure Details Consent: Risks of procedure as well as the alternatives and risks of each were explained to the (patient/caregiver).  Consent for procedure obtained. Time Out: Verified patient identification, verified procedure, site/side was marked, verified correct patient position, special equipment/implants available, medications/allergies/relevent history reviewed, required imaging and test results available.  Performed.  The area was cleaned with iodine and alcohol swabs.    The right glenohumeral joint was injected using 3 cc of 1% lidocaine on a 22-gauge 3-1/2 inch needle.  The syringe was switched to mixture containing 1 cc's of 40 mg Kenalog and 4 cc's of 0.25% bupivacaine was injected.  Ultrasound was used. Images were obtained in long views showing the injection.     A sterile dressing was applied.  Patient did tolerate procedure well.     ASSESSMENT & PLAN:   Adhesive capsulitis of right shoulder Acutely occurring with limited external rotation.  Symptoms more consistent with a capsulitis versus synovitis.  She does have nonspecific inflammatory changes down in the soft tissue around  the midshaft of the humerus. -Counseled on home exercise therapy and supportive care. -Injection today. -Could consider further lab work.  Shoulder impingement syndrome, left Acutely occurring.  She has a good range of motion of the left side. -Counseled on home exercise therapy and supportive care. -Could consider physical therapy, injection or further imaging.

## 2022-10-23 LAB — ALPHA-GAL PANEL
Allergen Lamb IgE: <0.1 kU/L
Beef IgE: <0.1 kU/L
IgE (Immunoglobulin E), Serum: 112 IU/mL (ref 6–495)
O215-IgE Alpha-Gal: <0.1 kU/L
Pork IgE: <0.1 kU/L

## 2022-10-23 LAB — CBC WITH DIFFERENTIAL/PLATELET
Basophils Absolute: 0 10*3/uL (ref 0.0–0.2)
Basos: 1 %
EOS (ABSOLUTE): 0.4 10*3/uL (ref 0.0–0.4)
Eos: 5 %
Hematocrit: 36 % (ref 34.0–46.6)
Hemoglobin: 11.9 g/dL (ref 11.1–15.9)
Immature Grans (Abs): 0 10*3/uL (ref 0.0–0.1)
Immature Granulocytes: 0 %
Lymphocytes Absolute: 1.9 10*3/uL (ref 0.7–3.1)
Lymphs: 25 %
MCH: 28.5 pg (ref 26.6–33.0)
MCHC: 33.1 g/dL (ref 31.5–35.7)
MCV: 86 fL (ref 79–97)
Monocytes Absolute: 0.6 10*3/uL (ref 0.1–0.9)
Monocytes: 8 %
Neutrophils Absolute: 4.7 10*3/uL (ref 1.4–7.0)
Neutrophils: 61 %
Platelets: 333 10*3/uL (ref 150–450)
RBC: 4.18 x10E6/uL (ref 3.77–5.28)
RDW: 12.5 % (ref 11.7–15.4)
WBC: 7.5 10*3/uL (ref 3.4–10.8)

## 2022-10-23 LAB — TRYPTASE: Tryptase: 12.5 ug/L (ref 2.2–13.2)

## 2022-10-23 LAB — C3 AND C4
Complement C3, Serum: 177 mg/dL — ABNORMAL HIGH (ref 82–167)
Complement C4, Serum: 34 mg/dL (ref 12–38)

## 2022-10-23 LAB — TSH+FREE T4
Free T4: 1.22 ng/dL (ref 0.82–1.77)
TSH: 3.02 u[IU]/mL (ref 0.450–4.500)

## 2022-10-23 LAB — CMP14+EGFR
ALT: 14 IU/L (ref 0–32)
AST: 19 IU/L (ref 0–40)
Albumin/Globulin Ratio: 1.5 (ref 1.2–2.2)
Albumin: 3.8 g/dL — ABNORMAL LOW (ref 3.9–4.9)
Alkaline Phosphatase: 89 IU/L (ref 44–121)
BUN/Creatinine Ratio: 38 — ABNORMAL HIGH (ref 12–28)
BUN: 27 mg/dL (ref 8–27)
Bilirubin Total: 0.2 mg/dL (ref 0.0–1.2)
CO2: 24 mmol/L (ref 20–29)
Calcium: 9.4 mg/dL (ref 8.7–10.3)
Chloride: 105 mmol/L (ref 96–106)
Creatinine, Ser: 0.72 mg/dL (ref 0.57–1.00)
Globulin, Total: 2.6 g/dL (ref 1.5–4.5)
Glucose: 83 mg/dL (ref 70–99)
Potassium: 4 mmol/L (ref 3.5–5.2)
Sodium: 144 mmol/L (ref 134–144)
Total Protein: 6.4 g/dL (ref 6.0–8.5)
eGFR: 92 mL/min/{1.73_m2} (ref >59)

## 2022-10-23 LAB — CHRONIC URTICARIA: cu index: <1 (ref <10)

## 2022-10-23 LAB — ANA W/REFLEX: Anti Nuclear Antibody (ANA): NEGATIVE

## 2022-11-14 ENCOUNTER — Encounter: Payer: Self-pay | Admitting: Family Medicine

## 2022-11-14 ENCOUNTER — Ambulatory Visit (HOSPITAL_BASED_OUTPATIENT_CLINIC_OR_DEPARTMENT_OTHER)
Admission: RE | Admit: 2022-11-14 | Discharge: 2022-11-14 | Disposition: A | Discharge status: Home / Self Care | Payer: Medicare Other | Source: Ambulatory Visit | Attending: Family Medicine | Admitting: Family Medicine

## 2022-11-14 ENCOUNTER — Ambulatory Visit (INDEPENDENT_AMBULATORY_CARE_PROVIDER_SITE_OTHER): Payer: Medicare Other | Admitting: Family Medicine

## 2022-11-14 VITALS — BP 130/82 | Ht 61.0 in | Wt 220.0 lb

## 2022-11-14 DIAGNOSIS — M7989 Other specified soft tissue disorders: Secondary | ICD-10-CM | POA: Insufficient documentation

## 2022-11-14 DIAGNOSIS — M7542 Impingement syndrome of left shoulder: Secondary | ICD-10-CM | POA: Diagnosis not present

## 2022-11-14 DIAGNOSIS — M7501 Adhesive capsulitis of right shoulder: Secondary | ICD-10-CM | POA: Diagnosis not present

## 2022-11-14 NOTE — Assessment & Plan Note (Signed)
Acute on chronic in nature.  Occurring in the mid humerus.  -Counseled on supportive care. -X-ray. -Could consider further imaging.

## 2022-11-14 NOTE — Patient Instructions (Signed)
Good to see you  Please use heat before exercise and ice after  Please continue the stretching for the right shoulder  We'll call with the results from today  Please send me a message in MyChart with any questions or updates.  Please see me back in 6-8 weeks or as needed if better.   --Dr. Raeford Razor

## 2022-11-14 NOTE — Assessment & Plan Note (Signed)
Having improvement with recent intra-articular injection.  Motion has improved but not completely normal yet. -Counseled on home exercise therapy and supportive care. -Consider physical therapy or further imaging

## 2022-11-14 NOTE — Assessment & Plan Note (Signed)
Has gotten improvement with conservative measures. -Counseled on home exercise therapy and supportive care. -Could consider injection.

## 2022-11-14 NOTE — Progress Notes (Signed)
Latoya Rios - 67 y.o. female MRN [id number]  Date of birth: 1956-07-26  SUBJECTIVE:  Including CC & ROS.  No chief complaint on file.   Latoya Rios is a 67 y.o. female that is following up for her right and left shoulder pain as well as the soft tissue mass on the right humerus.  She has gotten improvement with the previous steroid injection.  She has good range of motion.   Review of Systems See HPI   HISTORY: Past Medical, Surgical, Social, and Family History Reviewed & Updated per EMR.   Pertinent Historical Findings include:  Past Medical History:  Diagnosis Date   Abnormal renal function 03/01/2019   Formatting of this note is different from the original. eGFR  >=60.0 mL/min/1.21m2 >60.0  31.2Low  CM  >60.0 CM     Last Assessment & Plan:  Formatting of this note might be different from the original. Resolved as of last labs Rechecking labs to ensure stability   Accelerated hypertension 06/08/2020   Acute cystitis without hematuria 06/24/2020   Acute hypoxemic respiratory failure due to COVID-19 ([facility] 06/08/2020   Acute kidney injury [place] 06/23/2020   AKI (acute kidney injury) ([facility] 06/24/2020   ARF (acute renal failure) ([facility] 06/24/2020   Benign essential hypertension 05/29/2016   Last Assessment & Plan:  Formatting of this note is different from the original. Pertinent Data:   Current medication includes: carvediloL - 3.125 mg losartan-hydroCHLOROthiazide - 100-25 mg .  BP Readings from Last 3 Encounters:  02/29/20 (!) 206/86  08/31/19 (!) 142/78  08/16/19 (!) 160/56   LDL Calculated (mg/dL)  Date Value  08/31/2019 144 (H)  03/01/2019 156 (H)   Creatinine (mg/dL)  Date Val   CHF (congestive heart failure) (HCC)    Class 2 severe obesity due to excess calories with serious comorbidity and body mass index (BMI) of 37.0 to 37.9 in adult ([facility] 05/29/2016   Formatting of this note is different from the original. Wt Readings from Last 3 Encounters:  08/31/19 233 lb  08/16/19 239 lb  03/01/19  249 lb    Last Assessment & Plan:  Formatting of this note might be different from the original. BMI Assessment: Current Body mass index is 37.22 kg/m.  Patient BMI currently is above average (>25 kg/m2); BMI follow up plan is completed . Current barriers to heal   Demand ischemia 06/08/2020   Drug declined by patient 02/29/2020   Last Assessment & Plan:  Formatting of this note might be different from the original. Patient historically has declined statins and at some point was on losartan/HCTZ and carvedilol, but stopped taking them stating " I dont want to take medications ".  Today I again discussed with patient the risks of uncontrolled HYPERLIPIDEMIA and HYPERTENSION including but not limited to strokes, MIs, renal in   Encounter for preventative adult health care exam with abnormal findings 06/12/2017   Last Assessment & Plan:  Formatting of this note might be different from the original. Patient for wellness visit.  Overall doing good. No new concerns. Reviewed appropriate age screenings and vaccinations.  Influenza vaccine: Patient Declined Tdap/TD: Patient Declined Zoster vaccine: Patient Declined  Colonoscopy: UTD, due 2022 Mammogram: UTD  Depression screening: negative No falls reported  Dis   Gynecologic exam normal 08/16/2019   Last Assessment & Plan:  Formatting of this note might be different from the original. Pap smear with high risk hpv collected for screening Discussed and encouraged healthy lifestyle.  Always use sunscreen when  sun exposed.  Encouraged healthy diet with fresh fruits, vegetables, and lean meat.  Encouraged regular exercise for heart health and bone health.  Encouraged continue monthly SBE and yearl   Heart murmur    Hypertension    Hypotension due to drugs 06/22/2020   Mixed hyperlipidemia    Obese    Obesity (BMI 30-39.9) 06/08/2020   Pneumonia due to COVID-19 virus 06/08/2020    Past Surgical History:  Procedure Laterality Date   COLONOSCOPY     DENTAL SURGERY        PHYSICAL EXAM:  VS: BP 130/82   Ht '5\' 1"'$  (1.549 m)   Wt 220 lb (99.8 kg)   BMI 41.57 kg/m  Physical Exam Gen: NAD, alert, cooperative with exam, well-appearing MSK:  Neurovascularly intact       ASSESSMENT & PLAN:   Adhesive capsulitis of right shoulder Having improvement with recent intra-articular injection.  Motion has improved but not completely normal yet. -Counseled on home exercise therapy and supportive care. -Consider physical therapy or further imaging  Shoulder impingement syndrome, left Has gotten improvement with conservative measures. -Counseled on home exercise therapy and supportive care. -Could consider injection.  Mass of soft tissue of upper arm Acute on chronic in nature.  Occurring in the mid humerus.  -Counseled on supportive care. -X-ray. -Could consider further imaging.

## 2022-11-19 ENCOUNTER — Telehealth: Payer: Self-pay | Admitting: Family Medicine

## 2022-11-19 NOTE — Telephone Encounter (Signed)
Informed of results.   Rosemarie Ax, MD [facility] 11/19/2022, 4:21 PM

## 2022-12-04 ENCOUNTER — Ambulatory Visit (INDEPENDENT_AMBULATORY_CARE_PROVIDER_SITE_OTHER): Payer: Medicare Other | Admitting: Internal Medicine

## 2022-12-04 DIAGNOSIS — I129 Hypertensive chronic kidney disease with stage 1 through stage 4 chronic kidney disease, or unspecified chronic kidney disease: Secondary | ICD-10-CM | POA: Diagnosis not present

## 2022-12-04 DIAGNOSIS — N2581 Secondary hyperparathyroidism of renal origin: Secondary | ICD-10-CM | POA: Diagnosis not present

## 2022-12-04 DIAGNOSIS — M109 Gout, unspecified: Secondary | ICD-10-CM | POA: Diagnosis not present

## 2022-12-04 DIAGNOSIS — M2540 Effusion, unspecified joint: Secondary | ICD-10-CM | POA: Diagnosis not present

## 2022-12-04 DIAGNOSIS — L501 Idiopathic urticaria: Secondary | ICD-10-CM

## 2022-12-04 DIAGNOSIS — M79672 Pain in left foot: Secondary | ICD-10-CM | POA: Diagnosis not present

## 2022-12-04 DIAGNOSIS — D631 Anemia in chronic kidney disease: Secondary | ICD-10-CM | POA: Diagnosis not present

## 2022-12-04 DIAGNOSIS — J3089 Other allergic rhinitis: Secondary | ICD-10-CM

## 2022-12-04 DIAGNOSIS — M79671 Pain in right foot: Secondary | ICD-10-CM | POA: Diagnosis not present

## 2022-12-04 DIAGNOSIS — N1831 Chronic kidney disease, stage 3a: Secondary | ICD-10-CM | POA: Diagnosis not present

## 2022-12-04 MED ORDER — FLUTICASONE PROPIONATE 50 MCG/ACT NA SUSP: 2.0000 | Freq: Every day | NASAL | 5 refills | Status: AC

## 2022-12-04 MED ORDER — FEXOFENADINE HCL 180 MG PO TABS: 180.0000 mg | ORAL_TABLET | Freq: Two times a day (BID) | ORAL | 5 refills | Status: AC | PRN

## 2022-12-04 NOTE — Progress Notes (Signed)
FOLLOW UP Date of Service/Encounter:  12/04/22   Subjective:  Latoya Rios (DOB: July 04, 1956) is a 67 y.o. female who returns to the [facility] on 12/04/2022 for follow up for idiopathic urticaria and allergic rhinitis.   History obtained from: chart review and patient. Last visit was with me on 10/16/2022 where she had an allergic reaction where she developed hives and was seen in the ER.  Also noted to have some SOB and ER treated her possible anaphylaxis but had a normal respiratory exam and no swelling on exam.  Urticaria labwork was negative; tryptase was on the high side.    Since last visit, she has not had any further reoccurences.  No hives or reactions.  Takes Allegra 180mg  daily.  She was given an Epipen by the ER and has not had to use it.   In terms of rhinitis, doing well.  Sometimes has some congestion drainage that is controlled with Allegra and does not use Flonase regularly.     Past Medical History: Past Medical History:  Diagnosis Date   Abnormal renal function 03/01/2019   Formatting of this note is different from the original. eGFR  >=60.0 mL/min/1.46m*2 >60.0  31.2Low  CM  >60.0 CM     Last Assessment & Plan:  Formatting of this note might be different from the original. Resolved as of last labs Rechecking labs to ensure stability   Accelerated hypertension 06/08/2020   Acute cystitis without hematuria 06/24/2020   Acute hypoxemic respiratory failure due to COVID-19 [facility]) 06/08/2020   Acute kidney injury ([place]) 06/23/2020   AKI (acute kidney injury) ([place]) 06/24/2020   ARF (acute renal failure) ([place]) 06/24/2020   Benign essential hypertension 05/29/2016   Last Assessment & Plan:  Formatting of this note is different from the original. Pertinent Data:   Current medication includes: carvediloL - 3.125 mg losartan-hydroCHLOROthiazide - 100-25 mg .  BP Readings from Last 3 Encounters:  02/29/20 (!) 206/86  08/31/19 (!) 142/78  08/16/19 (!) 160/56   LDL Calculated  (mg/dL)  Date Value  08/31/2019 144 (H)  03/01/2019 156 (H)   Creatinine (mg/dL)  Date Val   CHF (congestive heart failure) (HCC)    Class 2 severe obesity due to excess calories with serious comorbidity and body mass index (BMI) of 37.0 to 37.9 in adult [facility]) 05/29/2016   Formatting of this note is different from the original. Wt Readings from Last 3 Encounters:  08/31/19 233 lb  08/16/19 239 lb  03/01/19 249 lb    Last Assessment & Plan:  Formatting of this note might be different from the original. BMI Assessment: Current Body mass index is 37.22 kg/m.  Patient BMI currently is above average (>25 kg/m2); BMI follow up plan is completed . Current barriers to heal   Demand ischemia 06/08/2020   Drug declined by patient 02/29/2020   Last Assessment & Plan:  Formatting of this note might be different from the original. Patient historically has declined statins and at some point was on losartan/HCTZ and carvedilol, but stopped taking them stating " I dont want to take medications ".  Today I again discussed with patient the risks of uncontrolled HYPERLIPIDEMIA and HYPERTENSION including but not limited to strokes, MIs, renal in   Encounter for preventative adult health care exam with abnormal findings 06/12/2017   Last Assessment & Plan:  Formatting of this note might be different from the original. Patient for wellness visit.  Overall doing good. No new concerns. Reviewed  appropriate age screenings and vaccinations.  Influenza vaccine: Patient Declined Tdap/TD: Patient Declined Zoster vaccine: Patient Declined  Colonoscopy: UTD, due 2022 Mammogram: UTD  Depression screening: negative No falls reported  Dis   Gynecologic exam normal 08/16/2019   Last Assessment & Plan:  Formatting of this note might be different from the original. Pap smear with high risk hpv collected for screening Discussed and encouraged healthy lifestyle.  Always use sunscreen when sun exposed.  Encouraged healthy diet with fresh fruits,  vegetables, and lean meat.  Encouraged regular exercise for heart health and bone health.  Encouraged continue monthly SBE and yearl   Heart murmur    Hypertension    Hypotension due to drugs 06/22/2020   Mixed hyperlipidemia    Obese    Obesity (BMI 30-39.9) 06/08/2020   Pneumonia due to COVID-19 virus 06/08/2020    Objective:  There were no vitals taken for this visit. There is no height or weight on file to calculate BMI. Physical Exam: GEN: alert, well developed HEENT: clear conjunctiva, nose with mild inferior turbinate hypertrophy, pink nasal mucosa, no rhinorrhea, no cobblestoning HEART: regular rate and rhythm, no murmur LUNGS: clear to auscultation bilaterally, no coughing, unlabored respiration SKIN: no rashes or lesions   Assessment:   1. Idiopathic urticaria   2. Other allergic rhinitis     Plan/Recommendations:  Idiopathic Urticaria (Hives): - Well controlled.  Initial reaction was concerning for hives with anxiety causing SOB vs true anaphylaxis.  Her physical exam only had hives but the ER treated her for possible anaphylaxis.  No triggers have been identified.  Alpha gal was negative.  Tryptase on high side of normal.  Informed her to keep a diary if this reoccurs.  - At this time etiology of hives and swelling is unknown. Hives can be caused by a variety of different triggers including illness/infection, exercise, pressure, vibrations, extremes of temperature to name a few however majority of the time there is no identifiable trigger.  - If symptoms persist, we can recheck Tryptase. -Continue Allegra 180mg  daily.   -If hives reoccur, increase to Allegra 180mg  twice daily.   -If no improvement in 2-3 days, add Pepcid 20mg  twice daily and continue Allegra 180mg  twice daily.    Allergic Rhinitis: - Well controlled on [place].  - Positive skin test 09/2022: pecan tree pollen  - Avoidance measures discussed. - Use nasal saline rinses before nose sprays such as with Neilmed  Sinus Rinse.  Use distilled water.   - Use Flonase 2 sprays each nostril daily. Aim upward and outward. - Use Allegra 180mg  daily.   Return in about 6 months (around 06/06/2023).  Harlon Flor, MD [facility]

## 2022-12-04 NOTE — Patient Instructions (Addendum)
Latoya Rios  Idiopathic Urticaria (Hives): - At this time etiology of hives and swelling is unknown. Hives can be caused by a variety of different triggers including illness/infection, exercise, pressure, vibrations, extremes of temperature to name a few however majority of the time there is no identifiable trigger.  -Continue Allegra 180mg  daily.   -If hives reoccur, increase to Allegra 180mg  twice daily.   -If no improvement in 2-3 days, add Pepcid 20mg  twice daily and continue Allegra 180mg  twice daily.    Allergic Rhinitis: - Positive skin test 09/2022: pecan tree pollen  - Avoidance measures discussed. - Use nasal saline rinses before nose sprays such as with Neilmed Sinus Rinse.  Use distilled water.   - Use Flonase 2 sprays each nostril daily. Aim upward and outward. - Use Allegra 180mg  daily.   Return in about 6 months (around 06/06/2023).

## 2022-12-30 ENCOUNTER — Encounter: Payer: Self-pay | Admitting: *Deleted

## 2023-01-01 ENCOUNTER — Encounter: Payer: Self-pay | Admitting: Cardiology

## 2023-01-01 ENCOUNTER — Ambulatory Visit: Payer: Medicare Other | Attending: Cardiology | Admitting: Cardiology

## 2023-01-01 VITALS — BP 136/84 | HR 62 | Ht 61.0 in | Wt 226.8 lb

## 2023-01-01 DIAGNOSIS — E669 Obesity, unspecified: Secondary | ICD-10-CM

## 2023-01-01 DIAGNOSIS — E782 Mixed hyperlipidemia: Secondary | ICD-10-CM

## 2023-01-01 DIAGNOSIS — I10 Essential (primary) hypertension: Secondary | ICD-10-CM

## 2023-01-01 NOTE — Progress Notes (Signed)
Cardiology Office Note:    Date:  01/04/2023   ID:  Latoya Rios, DOB 11/25/1955, MRN [id number]  PCP:  Esperanza Richters, PA-C  Cardiologist:  Thomasene Ripple, DO  Electrophysiologist:  None   Referring MD: Esperanza Richters, PA-C   " I am ok"  History of Present Illness:    Latoya Rios is a 67 y.o. female with a hx of  hypertension here today for follow-up visit.I saw the patient in October 2021 at that time she was hypotensive therefore I decreased her hydralazine and stopped the Aldactone.    Her visit on July 21, 2020 the patient reported that she had been experiencing some dizziness and was concerned about this.  I placed a monitor on the patient and also had blood pressure cuff mailed to the patient and encouraged her to take her blood pressure sitting and standing.   I last saw the patient on August 14, 2020 at that time her blood pressure was elevated I increased her carvedilol to 6.25 mg twice a day.  We talked about her monitor results which show occasional PACs, PVCs as well as paroxysmal atrial tachycardia.  She also had questions about exercise as and we discussed this.   I last saw the patient via video visit on September 06, 2020 at that time she appeared to be doing well.  No changes were made in her medications.   I saw the patient December 05, 2019.  She reported continued chest discomfort.  We will schedule coronary CTA.-Patient was able to get a coronary CTA which did not show any evidence of coronary artery disease.  I saw the patient on 06/18/2021 at that time she was doing wel, she had slight bp elevation in the office but I reviewed her home bp which were within normal.   I saw the patient on February 28, 2022 at that time she experiencing some shoulder pain and muscle pain.  During that visit her heart rate was 44, I cut back on her carvedilol to 3.125 mg twice daily.  We talked about likely possibilities for pain relief.  At her visit on June 24, 2022 she had not  started hydralazine, stop losartan and started the patient on valsartan 80 mg twice a day.  Coreg was recommended 3.25 mg twice daily.  I encouraged the patient to start the hydralazine.  At her last visit I started the patient on hydralazine 50 mg twice a day continue her valsartan as well as her Coreg.  We kept her on the Lasix 40 mg once weekly.  She is here today for follow-up visit.  Past Medical History:  Diagnosis Date   Abnormal renal function 03/01/2019   Formatting of this note is different from the original. eGFR  >=60.0 mL/min/1.72m*2 >60.0  31.2Low  CM  >60.0 CM     Last Assessment & Plan:  Formatting of this note might be different from the original. Resolved as of last labs Rechecking labs to ensure stability   Accelerated hypertension 06/08/2020   Acute cystitis without hematuria 06/24/2020   Acute hypoxemic respiratory failure due to COVID-19 06/08/2020   Acute kidney injury 06/23/2020   AKI (acute kidney injury) 06/24/2020   ARF (acute renal failure) 06/24/2020   Benign essential hypertension 05/29/2016   Last Assessment & Plan:  Formatting of this note is different from the original. Pertinent Data:   Current medication includes: carvediloL - 3.125 mg losartan-hydroCHLOROthiazide - 100-25 mg .  BP Readings from Last 3 Encounters:  02/29/20 Marland Kitchen)  206/86  08/31/19 (!) 142/78  08/16/19 (!) 160/56   LDL Calculated (mg/dL)  Date Value  16/06/9603 144 (H)  03/01/2019 156 (H)   Creatinine (mg/dL)  Date Val   CHF (congestive heart failure)    Class 2 severe obesity due to excess calories with serious comorbidity and body mass index (BMI) of 37.0 to 37.9 in adult 05/29/2016   Formatting of this note is different from the original. Wt Readings from Last 3 Encounters:  08/31/19 233 lb  08/16/19 239 lb  03/01/19 249 lb    Last Assessment & Plan:  Formatting of this note might be different from the original. BMI Assessment: Current Body mass index is 37.22 kg/m.  Patient BMI currently is above  average (>25 kg/m2); BMI follow up plan is completed . Current barriers to heal   Demand ischemia 06/08/2020   Drug declined by patient 02/29/2020   Last Assessment & Plan:  Formatting of this note might be different from the original. Patient historically has declined statins and at some point was on losartan/HCTZ and carvedilol, but stopped taking them stating " I dont want to take medications ".  Today I again discussed with patient the risks of uncontrolled HYPERLIPIDEMIA and HYPERTENSION including but not limited to strokes, MIs, renal in   Encounter for preventative adult health care exam with abnormal findings 06/12/2017   Last Assessment & Plan:  Formatting of this note might be different from the original. Patient for wellness visit.  Overall doing good. No new concerns. Reviewed appropriate age screenings and vaccinations.  Influenza vaccine: Patient Declined Tdap/TD: Patient Declined Zoster vaccine: Patient Declined  Colonoscopy: UTD, due 2022 Mammogram: UTD  Depression screening: negative No falls reported  Dis   Gynecologic exam normal 08/16/2019   Last Assessment & Plan:  Formatting of this note might be different from the original. Pap smear with high risk hpv collected for screening Discussed and encouraged healthy lifestyle.  Always use sunscreen when sun exposed.  Encouraged healthy diet with fresh fruits, vegetables, and lean meat.  Encouraged regular exercise for heart health and bone health.  Encouraged continue monthly SBE and yearl   Heart murmur    Hypertension    Hypotension due to drugs 06/22/2020   Mixed hyperlipidemia    Obese    Obesity (BMI 30-39.9) 06/08/2020   Pneumonia due to COVID-19 virus 06/08/2020    Past Surgical History:  Procedure Laterality Date   COLONOSCOPY     DENTAL SURGERY      Current Medications: No outpatient medications have been marked as taking for the 01/01/23 encounter (Office Visit) with Thomasene Ripple, DO.     Allergies:   Clonidine,  Nisoldipine, Penicillins, Quinacrine, and Quinapril   Social History   Socioeconomic History   Marital status: Widowed    Spouse name: Not on file   Number of children: Not on file   Years of education: Not on file   Highest education level: Not on file  Occupational History   Not on file  Tobacco Use   Smoking status: Never   Smokeless tobacco: Never  Substance and Sexual Activity   Alcohol use: Never   Drug use: Never   Sexual activity: Not Currently  Other Topics Concern   Not on file  Social History Narrative   Not on file   Social Determinants of Health   Financial Resource Strain: Low Risk  (09/04/2022)   Overall Financial Resource Strain (CARDIA)    Difficulty of Paying Living Expenses: Not  hard at all  Food Insecurity: No Food Insecurity (09/04/2022)   Hunger Vital Sign    Worried About Running Out of Food in the Last Year: Never true    Ran Out of Food in the Last Year: Never true  Transportation Needs: No Transportation Needs (09/04/2022)   PRAPARE - Administrator, Civil Service (Medical): No    Lack of Transportation (Non-Medical): No  Physical Activity: Inactive (09/04/2022)   Exercise Vital Sign    Days of Exercise per Week: 0 days    Minutes of Exercise per Session: 0 min  Stress: No Stress Concern Present (09/04/2022)   [employer] of Occupational Health - Occupational Stress Questionnaire    Feeling of Stress : Not at all  Social Connections: Moderately Integrated (09/04/2022)   Social Connection and Isolation Panel [NHANES]    Frequency of Communication with Friends and Family: More than three times a week    Frequency of Social Gatherings with Friends and Family: More than three times a week    Attends Religious Services: More than 4 times per year    Active Member of [employer] or Organizations: Yes    Attends Banker Meetings: 1 to 4 times per year    Marital Status: Widowed     Family History: The patient's family  history includes Brain cancer in her father; Emphysema in her mother; Lung disease in her mother.  ROS:   Review of Systems  Constitution: Negative for decreased appetite, fever and weight gain.  HENT: Negative for congestion, ear discharge, hoarse voice and sore throat.   Eyes: Negative for discharge, redness, vision loss in right eye and visual halos.  Cardiovascular: Negative for chest pain, dyspnea on exertion, leg swelling, orthopnea and palpitations.  Respiratory: Negative for cough, hemoptysis, shortness of breath and snoring.   Endocrine: Negative for heat intolerance and polyphagia.  Hematologic/Lymphatic: Negative for bleeding problem. Does not bruise/bleed easily.  Skin: Negative for flushing, nail changes, rash and suspicious lesions.  Musculoskeletal: Negative for arthritis, joint pain, muscle cramps, myalgias, neck pain and stiffness.  Gastrointestinal: Negative for abdominal pain, bowel incontinence, diarrhea and excessive appetite.  Genitourinary: Negative for decreased libido, genital sores and incomplete emptying.  Neurological: Negative for brief paralysis, focal weakness, headaches and loss of balance.  Psychiatric/Behavioral: Negative for altered mental status, depression and suicidal ideas.  Allergic/Immunologic: Negative for HIV exposure and persistent infections.    EKGs/Labs/Other Studies Reviewed:    The following studies were reviewed today:   EKG:  The ekg ordered today demonstrates sinus bradycardia with arrhythmia and ventricular escape   TTE 05/2020 IMPRESSIONS   1. Left ventricular ejection fraction, by estimation, is 60 to 65%. The  left ventricle has normal function. The left ventricle has no regional  wall motion abnormalities. Left ventricular diastolic parameters are  consistent with Grade I diastolic  dysfunction (impaired relaxation).   2. Right ventricular systolic function is normal. The right ventricular  size is normal. Tricuspid  regurgitation signal is inadequate for assessing  PA pressure.   3. The mitral valve is grossly normal. Mild mitral valve regurgitation.  No evidence of mitral stenosis.   4. The aortic valve is grossly normal. Aortic valve regurgitation is not  visualized. No aortic stenosis is present.   5. The inferior vena cava is normal in size with <50% respiratory  variability, suggesting right atrial pressure of 8 mmHg.   FINDINGS   Left Ventricle: Left ventricular ejection fraction, by estimation, is 60  to 65%. The left ventricle has normal function. The left ventricle has no  regional wall motion abnormalities. The left ventricular internal cavity  size was normal in size. There is   no left ventricular hypertrophy. Left ventricular diastolic parameters  are consistent with Grade I diastolic dysfunction (impaired relaxation).   Right Ventricle: The right ventricular size is normal. No increase in  right ventricular wall thickness. Right ventricular systolic function is  normal. Tricuspid regurgitation signal is inadequate for assessing PA  pressure.   Left Atrium: Left atrial size was normal in size.   Right Atrium: Right atrial size was normal in size.   Pericardium: Trivial pericardial effusion is present. Presence of  pericardial fat pad.   Mitral Valve: The mitral valve is grossly normal. Mild mitral valve  regurgitation. No evidence of mitral valve stenosis.   Tricuspid Valve: The tricuspid valve is grossly normal. Tricuspid valve  regurgitation is trivial. No evidence of tricuspid stenosis.   Aortic Valve: The aortic valve is grossly normal. Aortic valve  regurgitation is not visualized. No aortic stenosis is present.   Pulmonic Valve: The pulmonic valve was grossly normal. Pulmonic valve  regurgitation is not visualized. No evidence of pulmonic stenosis.   Aorta: The aortic root is normal in size and structure.   Venous: The inferior vena cava is normal in size with less  than 50%  respiratory variability, suggesting right atrial pressure of 8 mmHg.   IAS/Shunts: The atrial septum is grossly normal.        CCTA 03/12/2021 Aorta:  Normal size.  No calcifications.  No dissection.   Aortic Valve:  Trileaflet.  No calcifications.   Coronary Arteries:  Normal coronary origin.  Right dominance.   RCA is a large dominant artery that gives rise to PDA and PLA. There is no plaque.   Left main is a large artery that gives rise to LAD, Ramus intermedius and LCX arteries.   LAD is a large vessel that has no plaque.   Ramus with no plaques.   LCX is a non-dominant artery that gives rise to one large OM1 branch. There is no plaque.   Other findings:   Normal variant pulmonary vein (including right middle) all draining into the left atrium.   Normal left atrial appendage without a thrombus.   Normal size of the pulmonary artery.   IMPRESSION: 1. Coronary calcium score of 0. This was 0 percentile for age and sex matched control.   2. Normal coronary origin with right dominance.   3. No evidence of CAD. CAD-RADS 0. No evidence of CAD (0%). Consider non-atherosclerotic causes of chest pain.     Electronically Signed   By: Thomasene Ripple DO   On: 03/12/2021 17:07    Addended by Thomasene Ripple, DO on 03/12/2021  5:09 PM    Study Result   Narrative & Impression  EXAM: OVER-READ INTERPRETATION  CT CHEST   The following report is an over-read performed by radiologist Dr. Irish Lack of [facility] on 03/12/2021. This over-read does not include interpretation of cardiac or coronary anatomy or pathology. The coronary CTA interpretation by the cardiologist is attached.   COMPARISON:  None.   FINDINGS: Vascular: No significant noncardiac vascular findings.   Mediastinum/Nodes: Visualized mediastinum and hilar regions demonstrate no lymphadenopathy or masses.   Lungs/Pleura: Visualized lungs show no evidence of pulmonary  edema, consolidation, pneumothorax, nodule or pleural fluid.   Upper Abdomen: No acute abnormality.   Musculoskeletal: No chest wall mass  or suspicious bone lesions identified.   IMPRESSION: No significant incidental findings.   Electronically Signed: By: Irish Lack M.D. On: 03/12/2021 16:06    Recent Labs: 10/16/2022: ALT 14; BUN 27; Creatinine, Ser 0.72; Hemoglobin 11.9; Platelets 333; Potassium 4.0; Sodium 144; TSH 3.020  Recent Lipid Panel    Component Value Date/Time   TRIG 84 06/08/2020 0225    Physical Exam:    VS:  BP 136/84   Pulse 62   Ht  (1.549 m)   Wt 226 lb 12.8 oz (102.9 kg)   SpO2 97%   BMI 42.85 kg/m     Wt Readings from Last 3 Encounters:  01/01/23 226 lb 12.8 oz (102.9 kg)  11/14/22 220 lb (99.8 kg)  10/16/22 217 lb (98.4 kg)     GEN: Well nourished, well developed in no acute distress HEENT: Normal NECK: No JVD; No carotid bruits LYMPHATICS: No lymphadenopathy CARDIAC: S1S2 noted,RRR, no murmurs, rubs, gallops RESPIRATORY:  Clear to auscultation without rales, wheezing or rhonchi  ABDOMEN: Soft, non-tender, non-distended, +bowel sounds, no guarding. EXTREMITIES:+2 lower extremity edema, No cyanosis, no clubbing MUSCULOSKELETAL:  No deformity  SKIN: Warm and dry NEUROLOGIC:  Alert and oriented x 3, non-focal PSYCHIATRIC:  Normal affect, good insight  ASSESSMENT:    1. Primary hypertension   2. Obesity (BMI 30-39.9)   3. Mixed hyperlipidemia    PLAN:    She has gain some weight but plans to work on this by starting to exercise. S Her blood pressure is target.  Continue current statin dose.  The patient is in agreement with the above plan. The patient left the office in stable condition.  The patient will follow up in 4 months or sooner if needed.   Medication Adjustments/Labs and Tests Ordered: Current medicines are reviewed at length with the patient today.  Concerns regarding medicines are outlined above.  No orders of  the defined types were placed in this encounter.  No orders of the defined types were placed in this encounter.   Patient Instructions  Medication Instructions:  Your physician recommends that you continue on your current medications as directed. Please refer to the Current Medication list given to you today.  *If you need a refill on your cardiac medications before your next appointment, please call your pharmacy*   Lab Work: None   Testing/Procedures: None   Follow-Up: At [facility], you and your health needs are our priority.  As part of our continuing mission to provide you with exceptional heart care, we have created designated Provider Care Teams.  These Care Teams include your primary Cardiologist (physician) and Advanced Practice Providers (APPs -  Physician Assistants and Nurse Practitioners) who all work together to provide you with the care you need, when you need it.   Your next appointment:   9 month(s)  Provider:   Thomasene Ripple, DO    Adopting a Healthy Lifestyle.  Know what a healthy weight is for you (roughly BMI <25) and aim to maintain this   Aim for 7+ servings of fruits and vegetables daily   65-80+ fluid ounces of water or unsweet tea for healthy kidneys   Limit to max 1 drink of alcohol per day; avoid smoking/tobacco   Limit animal fats in diet for cholesterol and heart health - choose grass fed whenever available   Avoid highly processed foods, and foods high in saturated/trans fats   Aim for low stress - take time to unwind and care for your  mental health   Aim for 150 min of moderate intensity exercise weekly for heart health, and weights twice weekly for bone health   Aim for 7-9 hours of sleep daily   When it comes to diets, agreement about the perfect plan isnt easy to find, even among the experts. Experts at the [facility] of [employer] developed an idea known as the Healthy Eating Plate. Just imagine a plate divided  into logical, healthy portions.   The emphasis is on diet quality:   Load up on vegetables and fruits - one-half of your plate: Aim for color and variety, and remember that potatoes dont count.   Go for whole grains - one-quarter of your plate: Whole wheat, barley, wheat berries, quinoa, oats, brown rice, and foods made with them. If you want pasta, go with whole wheat pasta.   Protein power - one-quarter of your plate: Fish, chicken, beans, and nuts are all healthy, versatile protein sources. Limit red meat.   The diet, however, does go beyond the plate, offering a few other suggestions.   Use healthy plant oils, such as olive, canola, soy, corn, sunflower and peanut. Check the labels, and avoid partially hydrogenated oil, which have unhealthy trans fats.   If youre thirsty, drink water. Coffee and tea are good in moderation, but skip sugary drinks and limit milk and dairy products to one or two daily servings.   The type of carbohydrate in the diet is more important than the amount. Some sources of carbohydrates, such as vegetables, fruits, whole grains, and beans-are healthier than others.   Finally, stay active  Osvaldo Shipper, DO  01/04/2023 8:42 PM    [facility]

## 2023-01-01 NOTE — Patient Instructions (Signed)
Medication Instructions:  Your physician recommends that you continue on your current medications as directed. Please refer to the Current Medication list given to you today.  *If you need a refill on your cardiac medications before your next appointment, please call your pharmacy*   Lab Work: None   Testing/Procedures: None   Follow-Up: At [facility], you and your health needs are our priority.  As part of our continuing mission to provide you with exceptional heart care, we have created designated Provider Care Teams.  These Care Teams include your primary Cardiologist (physician) and Advanced Practice Providers (APPs -  Physician Assistants and Nurse Practitioners) who all work together to provide you with the care you need, when you need it.   Your next appointment:   9 month(s)  Provider:   Kardie Tobb, DO   

## 2023-01-29 ENCOUNTER — Other Ambulatory Visit: Payer: Self-pay | Admitting: Medical

## 2023-01-29 DIAGNOSIS — Z1231 Encounter for screening mammogram for malignant neoplasm of breast: Secondary | ICD-10-CM

## 2023-03-25 DIAGNOSIS — M79671 Pain in right foot: Secondary | ICD-10-CM | POA: Diagnosis not present

## 2023-03-25 DIAGNOSIS — M2540 Effusion, unspecified joint: Secondary | ICD-10-CM | POA: Diagnosis not present

## 2023-03-25 DIAGNOSIS — M109 Gout, unspecified: Secondary | ICD-10-CM | POA: Diagnosis not present

## 2023-03-25 DIAGNOSIS — M79672 Pain in left foot: Secondary | ICD-10-CM | POA: Diagnosis not present

## 2023-03-26 ENCOUNTER — Ambulatory Visit
Admission: RE | Admit: 2023-03-26 | Discharge: 2023-03-26 | Disposition: A | Discharge status: Home / Self Care | Payer: Medicare Other | Source: Ambulatory Visit | Attending: Medical | Admitting: Medical

## 2023-03-26 DIAGNOSIS — Z1231 Encounter for screening mammogram for malignant neoplasm of breast: Secondary | ICD-10-CM

## 2023-03-28 ENCOUNTER — Other Ambulatory Visit: Payer: Self-pay | Admitting: Cardiology

## 2023-04-10 ENCOUNTER — Encounter: Payer: Self-pay | Admitting: Cardiology

## 2023-07-07 ENCOUNTER — Other Ambulatory Visit: Payer: Self-pay | Admitting: Cardiology

## 2023-07-07 NOTE — Telephone Encounter (Signed)
Rx refill sent to pharmacy. 

## 2023-07-28 ENCOUNTER — Other Ambulatory Visit: Payer: Self-pay | Admitting: Cardiology

## 2023-08-26 DIAGNOSIS — M79672 Pain in left foot: Secondary | ICD-10-CM | POA: Diagnosis not present

## 2023-08-26 DIAGNOSIS — M109 Gout, unspecified: Secondary | ICD-10-CM | POA: Diagnosis not present

## 2023-08-26 DIAGNOSIS — M2540 Effusion, unspecified joint: Secondary | ICD-10-CM | POA: Diagnosis not present

## 2023-08-26 DIAGNOSIS — M79671 Pain in right foot: Secondary | ICD-10-CM | POA: Diagnosis not present

## 2023-08-27 ENCOUNTER — Telehealth: Payer: Self-pay | Admitting: Medical

## 2023-08-27 NOTE — Telephone Encounter (Signed)
Copied from CRM [id number]. Topic: Medicare AWV >> Aug 27, 2023 11:10 AM Payton Doughty wrote: Reason for CRM: Called LVM 08/27/2023 to schedule AWV TELEHEALTH ONLY  Verlee Rossetti; Care Guide Ambulatory Clinical Support [place] [facility] Direct Dial: [phone]

## 2023-09-06 ENCOUNTER — Other Ambulatory Visit: Payer: Self-pay | Admitting: Cardiology

## 2023-09-29 ENCOUNTER — Ambulatory Visit: Payer: Medicare Other | Admitting: Cardiology

## 2023-10-21 ENCOUNTER — Encounter: Payer: Self-pay | Admitting: Cardiology

## 2023-10-21 ENCOUNTER — Ambulatory Visit: Payer: Medicare HMO | Attending: Cardiology | Admitting: Cardiology

## 2023-10-21 VITALS — BP 178/90 | HR 98 | Ht 61.0 in | Wt 224.6 lb

## 2023-10-21 DIAGNOSIS — E782 Mixed hyperlipidemia: Secondary | ICD-10-CM | POA: Diagnosis not present

## 2023-10-21 DIAGNOSIS — I10 Essential (primary) hypertension: Secondary | ICD-10-CM | POA: Diagnosis not present

## 2023-10-21 NOTE — Patient Instructions (Signed)
Medication Instructions:  Your physician recommends that you continue on your current medications as directed. Please refer to the Current Medication list given to you today.  *If you need a refill on your cardiac medications before your next appointment, please call your pharmacy*   Follow-Up: At [facility], you and your health needs are our priority.  As part of our continuing mission to provide you with exceptional heart care, we have created designated Provider Care Teams.  These Care Teams include your primary Cardiologist (physician) and Advanced Practice Providers (APPs -  Physician Assistants and Nurse Practitioners) who all work together to provide you with the care you need, when you need it.   Your next appointment:   4 month(s)  Provider:   Thomasene Ripple, DO     Other Instructions:

## 2023-10-22 ENCOUNTER — Encounter: Payer: Self-pay | Admitting: Cardiology

## 2023-10-25 NOTE — Progress Notes (Signed)
 Cardiology Office Note:    Date:  10/25/2023   ID:  Latoya Rios, DOB 03-07-1956, MRN [id number]  PCP:  Dorina Loving, PA-C  Cardiologist:  Sibyl Mikula, DO  Electrophysiologist:  None   Referring MD: Saguier, Edward, PA-C    I am ok  History of Present Illness:    Latoya Rios is a 68 y.o. female with a hx of  hypertension here today for follow-up visit.I saw the patient in October 2021 at that time she was hypotensive therefore I decreased her hydralazine  and stopped the Aldactone .    Her visit on July 21, 2020 the patient reported that she had been experiencing some dizziness and was concerned about this.  I placed a monitor on the patient and also had blood pressure cuff mailed to the patient and encouraged her to take her blood pressure sitting and standing.   I last saw the patient on August 14, 2020 at that time her blood pressure was elevated I increased her carvedilol  to 6.25 mg twice a day.  We talked about her monitor results which show occasional PACs, PVCs as well as paroxysmal atrial tachycardia.  She also had questions about exercise as and we discussed this.   I last saw the patient via video visit on September 06, 2020 at that time she appeared to be doing well.  No changes were made in her medications.   I saw the patient December 05, 2019.  She reported continued chest discomfort.  We will schedule coronary CTA.-Patient was able to get a coronary CTA which did not show any evidence of coronary artery disease.  I saw the patient on 06/18/2021 at that time she was doing wel, she had slight bp elevation in the office but I reviewed her home bp which were within normal.   I saw the patient on February 28, 2022 at that time she experiencing some shoulder pain and muscle pain.  During that visit her heart rate was 44, I cut back on her carvedilol  to 3.125 mg twice daily.  We talked about likely possibilities for pain relief.  At her visit on June 24, 2022 she had not  started hydralazine , stop losartan  and started the patient on valsartan  80 mg twice a day.  Coreg  was recommended 3.25 mg twice daily.  I encouraged the patient to start the hydralazine .  She was seen 08/2022 at that time I started the patient on hydralazine  50 mg twice a day continue her valsartan  as well as her Coreg .  We kept her on the Lasix  40 mg once weekly.  She was seen on 01/01/2023, during that visit her blood pressure was at target.   She is her for a follow up visit - she shared with me that she has  self-discontinuing her blood pressure medication on January 1st. She reports feeling 'more alert' and has been able to walk from the parking lot to [employer], shop for an hour, and unload groceries without difficulty. She has not experienced any gout flare-ups since her last visit. She has also lost weight, from 237 pounds to 220 pounds, by eating right and keeping her sodium intake under 1500mg  and her calories at 1200 per day. She has not experienced any flu-like symptoms since discontinuing her medication.  She shared with me that she has has previous issues with not the pharmacist and difficulty filling her medications.    Past Medical History:  Diagnosis Date   Abnormal renal function 03/01/2019   Formatting of this note is  different from the original. eGFR  >=60.0 mL/min/1.66m*2 >60.0  31.2Low  CM  >60.0 CM     Last Assessment & Plan:  Formatting of this note might be different from the original. Resolved as of last labs Rechecking labs to ensure stability   Accelerated hypertension 06/08/2020   Acute cystitis without hematuria 06/24/2020   Acute hypoxemic respiratory failure due to COVID-19 [facility]) 06/08/2020   Acute kidney injury (HCC) 06/23/2020   AKI (acute kidney injury) (HCC) 06/24/2020   ARF (acute renal failure) (HCC) 06/24/2020   Benign essential hypertension 05/29/2016   Last Assessment & Plan:  Formatting of this note is different from the original. Pertinent Data:   Current  medication includes: carvediloL  - 3.125 mg losartan -hydroCHLOROthiazide  - 100-25 mg .  BP Readings from Last 3 Encounters:  02/29/20 (!) 206/86  08/31/19 (!) 142/78  08/16/19 (!) 160/56   LDL Calculated (mg/dL)  Date Value  87/84/7979 144 (H)  03/01/2019 156 (H)   Creatinine (mg/dL)  Date Val   CHF (congestive heart failure) (HCC)    Class 2 severe obesity due to excess calories with serious comorbidity and body mass index (BMI) of 37.0 to 37.9 in adult [facility]) 05/29/2016   Formatting of this note is different from the original. Wt Readings from Last 3 Encounters:  08/31/19 233 lb  08/16/19 239 lb  03/01/19 249 lb    Last Assessment & Plan:  Formatting of this note might be different from the original. BMI Assessment: Current Body mass index is 37.22 kg/m.  Patient BMI currently is above average (>25 kg/m2); BMI follow up plan is completed . Current barriers to heal   Demand ischemia (HCC) 06/08/2020   Drug declined by patient 02/29/2020   Last Assessment & Plan:  Formatting of this note might be different from the original. Patient historically has declined statins and at some point was on losartan /HCTZ and carvedilol , but stopped taking them stating  I dont want to take medications .  Today I again discussed with patient the risks of uncontrolled HYPERLIPIDEMIA and HYPERTENSION including but not limited to strokes, MIs, renal in   Encounter for preventative adult health care exam with abnormal findings 06/12/2017   Last Assessment & Plan:  Formatting of this note might be different from the original. Patient for wellness visit.  Overall doing good. No new concerns. Reviewed appropriate age screenings and vaccinations.  Influenza vaccine: Patient Declined Tdap/TD: Patient Declined Zoster vaccine: Patient Declined  Colonoscopy: UTD, due 2022 Mammogram: UTD  Depression screening: negative No falls reported  Dis   Gynecologic exam normal 08/16/2019   Last Assessment & Plan:  Formatting of this note might be  different from the original. Pap smear with high risk hpv collected for screening Discussed and encouraged healthy lifestyle.  Always use sunscreen when sun exposed.  Encouraged healthy diet with fresh fruits, vegetables, and lean meat.  Encouraged regular exercise for heart health and bone health.  Encouraged continue monthly SBE and yearl   Heart murmur    Hypertension    Hypotension due to drugs 06/22/2020   Mixed hyperlipidemia    Obese    Obesity (BMI 30-39.9) 06/08/2020   Pneumonia due to COVID-19 virus 06/08/2020    Past Surgical History:  Procedure Laterality Date   COLONOSCOPY     DENTAL SURGERY      Current Medications: Current Meds  Medication Sig   allopurinol (ZYLOPRIM) 100 MG tablet TAKE 1 TABLET BY MOUTH DAILY Oral Once a day for 30 days  carvedilol  (COREG ) 3.125 MG tablet TAKE 1 TABLET(3.125 MG) BY MOUTH TWICE DAILY   Cholecalciferol (VITAMIN D3) 10 MCG (400 UNIT) CAPS Take by mouth in the morning and at bedtime.   EPINEPHrine  0.3 mg/0.3 mL IJ SOAJ injection Inject 0.3 mg into the muscle as needed for anaphylaxis.   famotidine  (PEPCID ) 20 MG tablet Take 1 tablet (20 mg total) by mouth 2 (two) times daily as needed (hives).   fexofenadine  (ALLEGRA  ALLERGY ) 180 MG tablet Take 1 tablet (180 mg total) by mouth 2 (two) times daily as needed (hives).   fluticasone  (FLONASE ) 50 MCG/ACT nasal spray Place 2 sprays into both nostrils daily.   furosemide  (LASIX ) 40 MG tablet TAKE 1 TABLET(40 MG) BY MOUTH 1 TIME A WEEK   hydrALAZINE  (APRESOLINE ) 50 MG tablet Take 1 tablet (50 mg total) by mouth 2 (two) times daily.   nitroGLYCERIN  (NITROSTAT ) 0.4 MG SL tablet PLACE 1 TABLET UNDER THE TONGUE EVERY 5 MINUTES AS NEEDED NOT TO EXCEED 3 IN 15 MINUTES   potassium chloride  (KLOR-CON ) 10 MEQ tablet TAKE 1 TABLET(10 MEQ) BY MOUTH 1 TIME A WEEK   predniSONE  (DELTASONE ) 20 MG tablet Take 2 tablets (40 mg total) by mouth daily.   TART CHERRY PO Take 1 tablet by mouth daily.   valsartan  (DIOVAN )  160 MG tablet Take 1 tablet (160 mg total) by mouth 2 (two) times daily.     Allergies:   Clonidine, Nisoldipine, Penicillins, Quinacrine, and Quinapril   Social History   Socioeconomic History   Marital status: Widowed    Spouse name: Not on file   Number of children: Not on file   Years of education: Not on file   Highest education level: Not on file  Occupational History   Not on file  Tobacco Use   Smoking status: Never   Smokeless tobacco: Never  Substance and Sexual Activity   Alcohol use: Never   Drug use: Never   Sexual activity: Not Currently  Other Topics Concern   Not on file  Social History Narrative   Not on file   Social Drivers of Health   Financial Resource Strain: Low Risk  (09/04/2022)   Overall Financial Resource Strain (CARDIA)    Difficulty of Paying Living Expenses: Not hard at all  Food Insecurity: No Food Insecurity (09/04/2022)   Hunger Vital Sign    Worried About Running Out of Food in the Last Year: Never true    Ran Out of Food in the Last Year: Never true  Transportation Needs: No Transportation Needs (09/04/2022)   PRAPARE - Administrator, Civil Service (Medical): No    Lack of Transportation (Non-Medical): No  Physical Activity: Inactive (09/04/2022)   Exercise Vital Sign    Days of Exercise per Week: 0 days    Minutes of Exercise per Session: 0 min  Stress: No Stress Concern Present (09/04/2022)   [employer] of Occupational Health - Occupational Stress Questionnaire    Feeling of Stress : Not at all  Social Connections: Moderately Integrated (09/04/2022)   Social Connection and Isolation Panel [NHANES]    Frequency of Communication with Friends and Family: More than three times a week    Frequency of Social Gatherings with Friends and Family: More than three times a week    Attends Religious Services: More than 4 times per year    Active Member of [employer] or Organizations: Yes    Attends Banker  Meetings: 1 to 4 times per year  Marital Status: Widowed     Family History: The patient's family history includes Brain cancer in her father; Emphysema in her mother; Lung disease in her mother.  ROS:   Review of Systems  Constitution: Negative for decreased appetite, fever and weight gain.  HENT: Negative for congestion, ear discharge, hoarse voice and sore throat.   Eyes: Negative for discharge, redness, vision loss in right eye and visual halos.  Cardiovascular: Negative for chest pain, dyspnea on exertion, leg swelling, orthopnea and palpitations.  Respiratory: Negative for cough, hemoptysis, shortness of breath and snoring.   Endocrine: Negative for heat intolerance and polyphagia.  Hematologic/Lymphatic: Negative for bleeding problem. Does not bruise/bleed easily.  Skin: Negative for flushing, nail changes, rash and suspicious lesions.  Musculoskeletal: Negative for arthritis, joint pain, muscle cramps, myalgias, neck pain and stiffness.  Gastrointestinal: Negative for abdominal pain, bowel incontinence, diarrhea and excessive appetite.  Genitourinary: Negative for decreased libido, genital sores and incomplete emptying.  Neurological: Negative for brief paralysis, focal weakness, headaches and loss of balance.  Psychiatric/Behavioral: Negative for altered mental status, depression and suicidal ideas.  Allergic/Immunologic: Negative for HIV exposure and persistent infections.    EKGs/Labs/Other Studies Reviewed:    The following studies were reviewed today:   EKG:  The ekg ordered today demonstrates sinus bradycardia with arrhythmia and ventricular escape   TTE 05/2020 IMPRESSIONS   1. Left ventricular ejection fraction, by estimation, is 60 to 65%. The  left ventricle has normal function. The left ventricle has no regional  wall motion abnormalities. Left ventricular diastolic parameters are  consistent with Grade I diastolic  dysfunction (impaired relaxation).   2.  Right ventricular systolic function is normal. The right ventricular  size is normal. Tricuspid regurgitation signal is inadequate for assessing  PA pressure.   3. The mitral valve is grossly normal. Mild mitral valve regurgitation.  No evidence of mitral stenosis.   4. The aortic valve is grossly normal. Aortic valve regurgitation is not  visualized. No aortic stenosis is present.   5. The inferior vena cava is normal in size with <50% respiratory  variability, suggesting right atrial pressure of 8 mmHg.   FINDINGS   Left Ventricle: Left ventricular ejection fraction, by estimation, is 60  to 65%. The left ventricle has normal function. The left ventricle has no  regional wall motion abnormalities. The left ventricular internal cavity  size was normal in size. There is   no left ventricular hypertrophy. Left ventricular diastolic parameters  are consistent with Grade I diastolic dysfunction (impaired relaxation).   Right Ventricle: The right ventricular size is normal. No increase in  right ventricular wall thickness. Right ventricular systolic function is  normal. Tricuspid regurgitation signal is inadequate for assessing PA  pressure.   Left Atrium: Left atrial size was normal in size.   Right Atrium: Right atrial size was normal in size.   Pericardium: Trivial pericardial effusion is present. Presence of  pericardial fat pad.   Mitral Valve: The mitral valve is grossly normal. Mild mitral valve  regurgitation. No evidence of mitral valve stenosis.   Tricuspid Valve: The tricuspid valve is grossly normal. Tricuspid valve  regurgitation is trivial. No evidence of tricuspid stenosis.   Aortic Valve: The aortic valve is grossly normal. Aortic valve  regurgitation is not visualized. No aortic stenosis is present.   Pulmonic Valve: The pulmonic valve was grossly normal. Pulmonic valve  regurgitation is not visualized. No evidence of pulmonic stenosis.   Aorta: The aortic root  is  normal in size and structure.   Venous: The inferior vena cava is normal in size with less than 50%  respiratory variability, suggesting right atrial pressure of 8 mmHg.   IAS/Shunts: The atrial septum is grossly normal.        CCTA 03/12/2021 Aorta:  Normal size.  No calcifications.  No dissection.   Aortic Valve:  Trileaflet.  No calcifications.   Coronary Arteries:  Normal coronary origin.  Right dominance.   RCA is a large dominant artery that gives rise to PDA and PLA. There is no plaque.   Left main is a large artery that gives rise to LAD, Ramus intermedius and LCX arteries.   LAD is a large vessel that has no plaque.   Ramus with no plaques.   LCX is a non-dominant artery that gives rise to one large OM1 branch. There is no plaque.   Other findings:   Normal variant pulmonary vein (including right middle) all draining into the left atrium.   Normal left atrial appendage without a thrombus.   Normal size of the pulmonary artery.   IMPRESSION: 1. Coronary calcium score of 0. This was 0 percentile for age and sex matched control.   2. Normal coronary origin with right dominance.   3. No evidence of CAD. CAD-RADS 0. No evidence of CAD (0%). Consider non-atherosclerotic causes of chest pain.     Electronically Signed   By: Milany Geck DO   On: 03/12/2021 17:07    Addended by Ilsa Bonello, DO on 03/12/2021  5:09 PM    Study Result   Narrative & Impression  EXAM: OVER-READ INTERPRETATION  CT CHEST   The following report is an over-read performed by radiologist Dr. Marcey Moan of [facility] on 03/12/2021. This over-read does not include interpretation of cardiac or coronary anatomy or pathology. The coronary CTA interpretation by the cardiologist is attached.   COMPARISON:  None.   FINDINGS: Vascular: No significant noncardiac vascular findings.   Mediastinum/Nodes: Visualized mediastinum and hilar regions demonstrate no  lymphadenopathy or masses.   Lungs/Pleura: Visualized lungs show no evidence of pulmonary edema, consolidation, pneumothorax, nodule or pleural fluid.   Upper Abdomen: No acute abnormality.   Musculoskeletal: No chest wall mass or suspicious bone lesions identified.   IMPRESSION: No significant incidental findings.   Electronically Signed: By: Marcey Moan M.D. On: 03/12/2021 16:06    Recent Labs: No results found for requested labs within last 365 days.  Recent Lipid Panel    Component Value Date/Time   TRIG 84 06/08/2020 0225    Physical Exam:    VS:  BP (!) 178/90   Pulse 98   Ht 5' 1 (1.549 m)   Wt 224 lb 9.6 oz (101.9 kg)   SpO2 96%   BMI 42.44 kg/m     Wt Readings from Last 3 Encounters:  10/21/23 224 lb 9.6 oz (101.9 kg)  01/01/23 226 lb 12.8 oz (102.9 kg)  11/14/22 220 lb (99.8 kg)     GEN: Well nourished, well developed in no acute distress HEENT: Normal NECK: No JVD; No carotid bruits LYMPHATICS: No lymphadenopathy CARDIAC: S1S2 noted,RRR, no murmurs, rubs, gallops RESPIRATORY:  Clear to auscultation without rales, wheezing or rhonchi  ABDOMEN: Soft, non-tender, non-distended, +bowel sounds, no guarding. EXTREMITIES:+2 lower extremity edema, No cyanosis, no clubbing MUSCULOSKELETAL:  No deformity  SKIN: Warm and dry NEUROLOGIC:  Alert and oriented x 3, non-focal PSYCHIATRIC:  Normal affect, good insight  ASSESSMENT:    1.  Hypertension, unspecified type   2. Mixed hyperlipidemia   3. Morbid obesity (HCC)    PLAN:    Hypertension Patient self-discontinued antihypertensive medication on September 17, 2023. Reports feeling more alert and able to perform daily activities without fatigue. No symptoms of hypertensive crisis reported. -Monitor blood pressure closely. -Encourage patient to report any symptoms especially blurry vision, headache, shortness of breath or chest pain.  She would prefer to continue with lifestyle modification for now -  shared decision, I will follow the patient closely and she will take her blood pressure at home.  She has had reading at home with SBP in the 150s.   Gout No recent flare-ups reported.   Weight Management Patient reports weight loss from 237 lbs to 220 lbs through dietary changes and exercise. -Encourage continuation of healthy lifestyle changes. -Plan for follow-up in 4 months to monitor progress.  General Health Maintenance -Review of recent blood work from rheumatologist shows no concerning findings. -Plan for next appointment in new location in May 2025.  The patient is in agreement with the above plan. The patient left the office in stable condition.  The patient will follow up in 4 months or sooner if needed.   Medication Adjustments/Labs and Tests Ordered: Current medicines are reviewed at length with the patient today.  Concerns regarding medicines are outlined above.  Orders Placed This Encounter  Procedures   EKG 12-Lead   No orders of the defined types were placed in this encounter.   Patient Instructions  Medication Instructions:  Your physician recommends that you continue on your current medications as directed. Please refer to the Current Medication list given to you today.  *If you need a refill on your cardiac medications before your next appointment, please call your pharmacy*   Follow-Up: At [facility], you and your health needs are our priority.  As part of our continuing mission to provide you with exceptional heart care, we have created designated Provider Care Teams.  These Care Teams include your primary Cardiologist (physician) and Advanced Practice Providers (APPs -  Physician Assistants and Nurse Practitioners) who all work together to provide you with the care you need, when you need it.   Your next appointment:   4 month(s)  Provider:   Phelan Goers, DO     Other Instructions:     Adopting a Healthy Lifestyle.  Know what a  healthy weight is for you (roughly BMI <25) and aim to maintain this   Aim for 7+ servings of fruits and vegetables daily   65-80+ fluid ounces of water or unsweet tea for healthy kidneys   Limit to max 1 drink of alcohol per day; avoid smoking/tobacco   Limit animal fats in diet for cholesterol and heart health - choose grass fed whenever available   Avoid highly processed foods, and foods high in saturated/trans fats   Aim for low stress - take time to unwind and care for your mental health   Aim for 150 min of moderate intensity exercise weekly for heart health, and weights twice weekly for bone health   Aim for 7-9 hours of sleep daily   When it comes to diets, agreement about the perfect plan isnt easy to find, even among the experts. Experts at the [facility] of [employer] developed an idea known as the Healthy Eating Plate. Just imagine a plate divided into logical, healthy portions.   The emphasis is on diet quality:   Load up on vegetables and  fruits - one-half of your plate: Aim for color and variety, and remember that potatoes dont count.   Go for whole grains - one-quarter of your plate: Whole wheat, barley, wheat berries, quinoa, oats, brown rice, and foods made with them. If you want pasta, go with whole wheat pasta.   Protein power - one-quarter of your plate: Fish, chicken, beans, and nuts are all healthy, versatile protein sources. Limit red meat.   The diet, however, does go beyond the plate, offering a few other suggestions.   Use healthy plant oils, such as olive, canola, soy, corn, sunflower and peanut. Check the labels, and avoid partially hydrogenated oil, which have unhealthy trans fats.   If youre thirsty, drink water. Coffee and tea are good in moderation, but skip sugary drinks and limit milk and dairy products to one or two daily servings.   The type of carbohydrate in the diet is more important than the amount. Some sources of carbohydrates,  such as vegetables, fruits, whole grains, and beans-are healthier than others.   Finally, stay active  Signed, Samin Milke, DO  10/25/2023 9:21 PM    [facility]

## 2023-12-06 ENCOUNTER — Other Ambulatory Visit: Payer: Self-pay | Admitting: Cardiology

## 2023-12-10 ENCOUNTER — Other Ambulatory Visit: Payer: Self-pay

## 2023-12-10 MED ORDER — NITROGLYCERIN 0.4 MG SL SUBL: 0.4000 mg | SUBLINGUAL_TABLET | SUBLINGUAL | 11 refills | Status: AC | PRN

## 2024-02-12 ENCOUNTER — Ambulatory Visit: Payer: Medicare Other | Admitting: Cardiology

## 2024-02-18 ENCOUNTER — Telehealth: Payer: Self-pay | Admitting: Medical

## 2024-02-18 NOTE — Telephone Encounter (Signed)
 Copied from CRM [id number]. Topic: Medicare AWV >> Feb 18, 2024  3:25 PM Juliana Ocean wrote: Reason for CRM: LVM 02/18/2024 to schedule AWV. Please schedule Virtual or Telehealth visits ONLY  Rosalee Collins; Care Guide Ambulatory Clinical Support [facility] Direct Dial: [phone]

## 2024-04-28 ENCOUNTER — Other Ambulatory Visit: Payer: Self-pay | Admitting: Medical

## 2024-04-28 DIAGNOSIS — Z1231 Encounter for screening mammogram for malignant neoplasm of breast: Secondary | ICD-10-CM

## 2024-05-11 ENCOUNTER — Ambulatory Visit
Admission: RE | Admit: 2024-05-11 | Discharge: 2024-05-11 | Disposition: A | Discharge status: Home / Self Care | Source: Ambulatory Visit

## 2024-05-11 DIAGNOSIS — Z1231 Encounter for screening mammogram for malignant neoplasm of breast: Secondary | ICD-10-CM

## 2024-05-15 ENCOUNTER — Ambulatory Visit: Payer: Self-pay | Admitting: Medical

## 2024-05-26 ENCOUNTER — Encounter: Payer: Self-pay | Admitting: Cardiology

## 2024-05-26 ENCOUNTER — Ambulatory Visit: Attending: Cardiovascular Disease | Admitting: Cardiology

## 2024-05-26 VITALS — BP 198/100 | HR 96 | Ht 61.0 in | Wt 209.6 lb

## 2024-05-26 DIAGNOSIS — E669 Obesity, unspecified: Secondary | ICD-10-CM

## 2024-05-26 DIAGNOSIS — N184 Chronic kidney disease, stage 4 (severe): Secondary | ICD-10-CM

## 2024-05-26 DIAGNOSIS — Z79899 Other long term (current) drug therapy: Secondary | ICD-10-CM

## 2024-05-26 DIAGNOSIS — Z91148 Patient's other noncompliance with medication regimen for other reason: Secondary | ICD-10-CM | POA: Diagnosis not present

## 2024-05-26 DIAGNOSIS — I10 Essential (primary) hypertension: Secondary | ICD-10-CM | POA: Diagnosis not present

## 2024-05-26 NOTE — Patient Instructions (Signed)
 Medication Instructions:  Your physician recommends that you continue on your current medications as directed. Please refer to the Current Medication list given to you today.  *If you need a refill on your cardiac medications before your next appointment, please call your pharmacy*  Lab Work: CMET, Mag If you have labs (blood work) drawn today and your tests are completely normal, you will receive your results only by: MyChart Message (if you have MyChart) OR A paper copy in the mail If you have any lab test that is abnormal or we need to change your treatment, we will call you to review the results.   Follow-Up: At [facility], you and your health needs are our priority.  As part of our continuing mission to provide you with exceptional heart care, our providers are all part of one team.  This team includes your primary Cardiologist (physician) and Advanced Practice Providers or APPs (Physician Assistants and Nurse Practitioners) who all work together to provide you with the care you need, when you need it.  Your next appointment:   9 month(s)  Provider:   Kardie Tobb, DO

## 2024-05-27 LAB — COMPREHENSIVE METABOLIC PANEL WITH GFR
ALT: 13 IU/L (ref 0–32)
AST: 17 IU/L (ref 0–40)
Albumin: 4 g/dL (ref 3.9–4.9)
Alkaline Phosphatase: 91 IU/L (ref 44–121)
BUN/Creatinine Ratio: 28 (ref 12–28)
BUN: 24 mg/dL (ref 8–27)
Bilirubin Total: 0.5 mg/dL (ref 0.0–1.2)
CO2: 22 mmol/L (ref 20–29)
Calcium: 9.3 mg/dL (ref 8.7–10.3)
Chloride: 101 mmol/L (ref 96–106)
Creatinine, Ser: 0.87 mg/dL (ref 0.57–1.00)
Globulin, Total: 3.2 g/dL (ref 1.5–4.5)
Glucose: 76 mg/dL (ref 70–99)
Potassium: 3.8 mmol/L (ref 3.5–5.2)
Sodium: 140 mmol/L (ref 134–144)
Total Protein: 7.2 g/dL (ref 6.0–8.5)
eGFR: 73 mL/min/1.73 (ref >59)

## 2024-05-27 LAB — MAGNESIUM: Magnesium: 1.8 mg/dL (ref 1.6–2.3)

## 2024-05-30 NOTE — Progress Notes (Signed)
 Cardiology Office Note:    Date:  05/30/2024   ID:  Latoya Rios, DOB 01/22/1956, MRN [id number]  PCP:  Dorina Loving, PA-C  Cardiologist:  Andreal Vultaggio, DO  Electrophysiologist:  None   Referring MD: Saguier, Edward, PA-C    I am ok  History of Present Illness:    Latoya Rios is a 68 y.o. female with a hx of  hypertension here today for follow-up visit.I saw the patient in October 2021 at that time she was hypotensive therefore I decreased her hydralazine  and stopped the Aldactone .    Her visit on July 21, 2020 the patient reported that she had been experiencing some dizziness and was concerned about this.  I placed a monitor on the patient and also had blood pressure cuff mailed to the patient and encouraged her to take her blood pressure sitting and standing.   I last saw the patient on August 14, 2020 at that time her blood pressure was elevated I increased her carvedilol  to 6.25 mg twice a day.  We talked about her monitor results which show occasional PACs, PVCs as well as paroxysmal atrial tachycardia.  She also had questions about exercise as and we discussed this.   I last saw the patient via video visit on September 06, 2020 at that time she appeared to be doing well.  No changes were made in her medications.   I saw the patient December 05, 2019.  She reported continued chest discomfort.  We will schedule coronary CTA.-Patient was able to get a coronary CTA which did not show any evidence of coronary artery disease.  I saw the patient on 06/18/2021 at that time she was doing wel, she had slight bp elevation in the office but I reviewed her home bp which were within normal.   I saw the patient on February 28, 2022 at that time she experiencing some shoulder pain and muscle pain.  During that visit her heart rate was 44, I cut back on her carvedilol  to 3.125 mg twice daily.  We talked about likely possibilities for pain relief.  At her visit on June 24, 2022 she had not  started hydralazine , stop losartan  and started the patient on valsartan  80 mg twice a day.  Coreg  was recommended 3.25 mg twice daily.  I encouraged the patient to start the hydralazine .  She was seen 08/2022 at that time I started the patient on hydralazine  50 mg twice a day continue her valsartan  as well as her Coreg .  We kept her on the Lasix  40 mg once weekly.  She was seen on 01/01/2023, during that visit her blood pressure was at target.   Her visit in February she shared with me that she has  self-discontinuing her blood pressure medication on January 1st. She reports feeling 'more alert' and has been able to walk from the parking lot to [place], shop for an hour, and unload groceries without difficulty. She has not experienced any gout flare-ups since her last visit. She has also lost weight, from 237 pounds to 220 pounds, by eating right and keeping her sodium intake under 1500mg  and her calories at 1200 per day. She has not experienced any flu-like symptoms since discontinuing her medication. She was not willing to get back on antihypertensive.  Since her visit She has not refilled her antihypertensive medications, including carvedilol , hydralazine , and valsartan , since January 1st. She has not experienced dizziness or blurry vision despite not taking these medications for over eight months. She continues  to take Lasix  as needed, particularly when traveling, to manage swelling, which is not significant unless she travels.  She recently moved closer to her daughter and grandchildren, which has been a positive change. She traveled to [place]  from May 6th to May 30th, where she stayed in a hotel and attended a church. She participated in singing with the [employer] and plans to visit [place]  two to three times a year if possible.    Past Medical History:  Diagnosis Date   Abnormal renal function 03/01/2019   Formatting of this note is different from the original. eGFR  >=60.0  mL/min/1.68m*2 >60.0  31.2Low  CM  >60.0 CM     Last Assessment & Plan:  Formatting of this note might be different from the original. Resolved as of last labs Rechecking labs to ensure stability   Accelerated hypertension 06/08/2020   Acute cystitis without hematuria 06/24/2020   Acute hypoxemic respiratory failure due to COVID-19 [facility]) 06/08/2020   Acute kidney injury (HCC) 06/23/2020   AKI (acute kidney injury) (HCC) 06/24/2020   ARF (acute renal failure) (HCC) 06/24/2020   Benign essential hypertension 05/29/2016   Last Assessment & Plan:  Formatting of this note is different from the original. Pertinent Data:   Current medication includes: carvediloL  - 3.125 mg losartan -hydroCHLOROthiazide  - 100-25 mg .  BP Readings from Last 3 Encounters:  02/29/20 (!) 206/86  08/31/19 (!) 142/78  08/16/19 (!) 160/56   LDL Calculated (mg/dL)  Date Value  87/84/7979 144 (H)  03/01/2019 156 (H)   Creatinine (mg/dL)  Date Val   CHF (congestive heart failure) (HCC)    Class 2 severe obesity due to excess calories with serious comorbidity and body mass index (BMI) of 37.0 to 37.9 in adult [facility] 05/29/2016   Formatting of this note is different from the original. Wt Readings from Last 3 Encounters:  08/31/19 233 lb  08/16/19 239 lb  03/01/19 249 lb    Last Assessment & Plan:  Formatting of this note might be different from the original. BMI Assessment: Current Body mass index is 37.22 kg/m.  Patient BMI currently is above average (>25 kg/m2); BMI follow up plan is completed . Current barriers to heal   Demand ischemia (HCC) 06/08/2020   Drug declined by patient 02/29/2020   Last Assessment & Plan:  Formatting of this note might be different from the original. Patient historically has declined statins and at some point was on losartan /HCTZ and carvedilol , but stopped taking them stating  I dont want to take medications .  Today I again discussed with patient the risks of uncontrolled HYPERLIPIDEMIA and HYPERTENSION including  but not limited to strokes, MIs, renal in   Encounter for preventative adult health care exam with abnormal findings 06/12/2017   Last Assessment & Plan:  Formatting of this note might be different from the original. Patient for wellness visit.  Overall doing good. No new concerns. Reviewed appropriate age screenings and vaccinations.  Influenza vaccine: Patient Declined Tdap/TD: Patient Declined Zoster vaccine: Patient Declined  Colonoscopy: UTD, due 2022 Mammogram: UTD  Depression screening: negative No falls reported  Dis   Gynecologic exam normal 08/16/2019   Last Assessment & Plan:  Formatting of this note might be different from the original. Pap smear with high risk hpv collected for screening Discussed and encouraged healthy lifestyle.  Always use sunscreen when sun exposed.  Encouraged healthy diet with fresh fruits, vegetables, and lean meat.  Encouraged regular exercise for heart health and bone  health.  Encouraged continue monthly SBE and yearl   Heart murmur    Hypertension    Hypotension due to drugs 06/22/2020   Mixed hyperlipidemia    Obese    Obesity (BMI 30-39.9) 06/08/2020   Pneumonia due to COVID-19 virus 06/08/2020    Past Surgical History:  Procedure Laterality Date   COLONOSCOPY     DENTAL SURGERY      Current Medications: Current Meds  Medication Sig   allopurinol (ZYLOPRIM) 100 MG tablet TAKE 1 TABLET BY MOUTH DAILY Oral Once a day for 30 days   carvedilol  (COREG ) 3.125 MG tablet TAKE 1 TABLET(3.125 MG) BY MOUTH TWICE DAILY   Cholecalciferol (VITAMIN D3) 10 MCG (400 UNIT) CAPS Take by mouth in the morning and at bedtime.   EPINEPHrine  0.3 mg/0.3 mL IJ SOAJ injection Inject 0.3 mg into the muscle as needed for anaphylaxis.   fexofenadine  (ALLEGRA  ALLERGY ) 180 MG tablet Take 1 tablet (180 mg total) by mouth 2 (two) times daily as needed (hives).   fluticasone  (FLONASE ) 50 MCG/ACT nasal spray Place 2 sprays into both nostrils daily.   furosemide  (LASIX ) 40 MG tablet  TAKE 1 TABLET(40 MG) BY MOUTH 1 TIME A WEEK   hydrALAZINE  (APRESOLINE ) 50 MG tablet Take 1 tablet (50 mg total) by mouth 2 (two) times daily.   nitroGLYCERIN  (NITROSTAT ) 0.4 MG SL tablet Place 1 tablet (0.4 mg total) under the tongue every 5 (five) minutes as needed for chest pain.   potassium chloride  (KLOR-CON ) 10 MEQ tablet TAKE 1 TABLET(10 MEQ) BY MOUTH 1 TIME A WEEK   TART CHERRY PO Take 1 tablet by mouth daily.   valsartan  (DIOVAN ) 160 MG tablet TAKE 1 TABLET(160 MG) BY MOUTH TWICE DAILY     Allergies:   Clonidine, Nisoldipine, Penicillins, Quinacrine, and Quinapril   Social History   Socioeconomic History   Marital status: Widowed    Spouse name: Not on file   Number of children: Not on file   Years of education: Not on file   Highest education level: Not on file  Occupational History   Not on file  Tobacco Use   Smoking status: Never   Smokeless tobacco: Never  Substance and Sexual Activity   Alcohol use: Never   Drug use: Never   Sexual activity: Not Currently  Other Topics Concern   Not on file  Social History Narrative   Not on file   Social Drivers of Health   Financial Resource Strain: Low Risk  (09/04/2022)   Overall Financial Resource Strain (CARDIA)    Difficulty of Paying Living Expenses: Not hard at all  Food Insecurity: No Food Insecurity (09/04/2022)   Hunger Vital Sign    Worried About Running Out of Food in the Last Year: Never true    Ran Out of Food in the Last Year: Never true  Transportation Needs: No Transportation Needs (09/04/2022)   PRAPARE - Administrator, Civil Service (Medical): No    Lack of Transportation (Non-Medical): No  Physical Activity: Inactive (09/04/2022)   Exercise Vital Sign    Days of Exercise per Week: 0 days    Minutes of Exercise per Session: 0 min  Stress: No Stress Concern Present (09/04/2022)   [employer] of Occupational Health - Occupational Stress Questionnaire    Feeling of Stress : Not at  all  Social Connections: Moderately Integrated (09/04/2022)   Social Connection and Isolation Panel    Frequency of Communication with Friends and Family: More than  three times a week    Frequency of Social Gatherings with Friends and Family: More than three times a week    Attends Religious Services: More than 4 times per year    Active Member of Clubs or Organizations: Yes    Attends Banker Meetings: 1 to 4 times per year    Marital Status: Widowed     Family History: The patient's family history includes Brain cancer in her father; Emphysema in her mother; Lung disease in her mother.  ROS:   Review of Systems  Constitution: Negative for decreased appetite, fever and weight gain.  HENT: Negative for congestion, ear discharge, hoarse voice and sore throat.   Eyes: Negative for discharge, redness, vision loss in right eye and visual halos.  Cardiovascular: Negative for chest pain, dyspnea on exertion, leg swelling, orthopnea and palpitations.  Respiratory: Negative for cough, hemoptysis, shortness of breath and snoring.   Endocrine: Negative for heat intolerance and polyphagia.  Hematologic/Lymphatic: Negative for bleeding problem. Does not bruise/bleed easily.  Skin: Negative for flushing, nail changes, rash and suspicious lesions.  Musculoskeletal: Negative for arthritis, joint pain, muscle cramps, myalgias, neck pain and stiffness.  Gastrointestinal: Negative for abdominal pain, bowel incontinence, diarrhea and excessive appetite.  Genitourinary: Negative for decreased libido, genital sores and incomplete emptying.  Neurological: Negative for brief paralysis, focal weakness, headaches and loss of balance.  Psychiatric/Behavioral: Negative for altered mental status, depression and suicidal ideas.  Allergic/Immunologic: Negative for HIV exposure and persistent infections.    EKGs/Labs/Other Studies Reviewed:    The following studies were reviewed today:   EKG:  The  ekg ordered today demonstrates sinus bradycardia with arrhythmia and ventricular escape   TTE 05/2020 IMPRESSIONS   1. Left ventricular ejection fraction, by estimation, is 60 to 65%. The  left ventricle has normal function. The left ventricle has no regional  wall motion abnormalities. Left ventricular diastolic parameters are  consistent with Grade I diastolic  dysfunction (impaired relaxation).   2. Right ventricular systolic function is normal. The right ventricular  size is normal. Tricuspid regurgitation signal is inadequate for assessing  PA pressure.   3. The mitral valve is grossly normal. Mild mitral valve regurgitation.  No evidence of mitral stenosis.   4. The aortic valve is grossly normal. Aortic valve regurgitation is not  visualized. No aortic stenosis is present.   5. The inferior vena cava is normal in size with <50% respiratory  variability, suggesting right atrial pressure of 8 mmHg.   FINDINGS   Left Ventricle: Left ventricular ejection fraction, by estimation, is 60  to 65%. The left ventricle has normal function. The left ventricle has no  regional wall motion abnormalities. The left ventricular internal cavity  size was normal in size. There is   no left ventricular hypertrophy. Left ventricular diastolic parameters  are consistent with Grade I diastolic dysfunction (impaired relaxation).   Right Ventricle: The right ventricular size is normal. No increase in  right ventricular wall thickness. Right ventricular systolic function is  normal. Tricuspid regurgitation signal is inadequate for assessing PA  pressure.   Left Atrium: Left atrial size was normal in size.   Right Atrium: Right atrial size was normal in size.   Pericardium: Trivial pericardial effusion is present. Presence of  pericardial fat pad.   Mitral Valve: The mitral valve is grossly normal. Mild mitral valve  regurgitation. No evidence of mitral valve stenosis.   Tricuspid Valve: The  tricuspid valve is grossly normal. Tricuspid valve  regurgitation is trivial. No evidence of tricuspid stenosis.   Aortic Valve: The aortic valve is grossly normal. Aortic valve  regurgitation is not visualized. No aortic stenosis is present.   Pulmonic Valve: The pulmonic valve was grossly normal. Pulmonic valve  regurgitation is not visualized. No evidence of pulmonic stenosis.   Aorta: The aortic root is normal in size and structure.   Venous: The inferior vena cava is normal in size with less than 50%  respiratory variability, suggesting right atrial pressure of 8 mmHg.   IAS/Shunts: The atrial septum is grossly normal.        CCTA 03/12/2021 Aorta:  Normal size.  No calcifications.  No dissection.   Aortic Valve:  Trileaflet.  No calcifications.   Coronary Arteries:  Normal coronary origin.  Right dominance.   RCA is a large dominant artery that gives rise to PDA and PLA. There is no plaque.   Left main is a large artery that gives rise to LAD, Ramus intermedius and LCX arteries.   LAD is a large vessel that has no plaque.   Ramus with no plaques.   LCX is a non-dominant artery that gives rise to one large OM1 branch. There is no plaque.   Other findings:   Normal variant pulmonary vein (including right middle) all draining into the left atrium.   Normal left atrial appendage without a thrombus.   Normal size of the pulmonary artery.   IMPRESSION: 1. Coronary calcium score of 0. This was 0 percentile for age and sex matched control.   2. Normal coronary origin with right dominance.   3. No evidence of CAD. CAD-RADS 0. No evidence of CAD (0%). Consider non-atherosclerotic causes of chest pain.     Electronically Signed   By: Leliana Kontz DO   On: 03/12/2021 17:07    Addended by Genae Strine, DO on 03/12/2021  5:09 PM    Study Result   Narrative & Impression  EXAM: OVER-READ INTERPRETATION  CT CHEST   The following report is an over-read  performed by radiologist Dr. Marcey Moan of [facility] on 03/12/2021. This over-read does not include interpretation of cardiac or coronary anatomy or pathology. The coronary CTA interpretation by the cardiologist is attached.   COMPARISON:  None.   FINDINGS: Vascular: No significant noncardiac vascular findings.   Mediastinum/Nodes: Visualized mediastinum and hilar regions demonstrate no lymphadenopathy or masses.   Lungs/Pleura: Visualized lungs show no evidence of pulmonary edema, consolidation, pneumothorax, nodule or pleural fluid.   Upper Abdomen: No acute abnormality.   Musculoskeletal: No chest wall mass or suspicious bone lesions identified.   IMPRESSION: No significant incidental findings.   Electronically Signed: By: Marcey Moan M.D. On: 03/12/2021 16:06    Recent Labs: 05/26/2024: ALT 13; BUN 24; Creatinine, Ser 0.87; Magnesium 1.8; Potassium 3.8; Sodium 140  Recent Lipid Panel    Component Value Date/Time   TRIG 84 06/08/2020 0225    Physical Exam:    VS:  BP (!) 198/100   Pulse 96   Ht 5' 1 (1.549 m)   Wt 209 lb 9.6 oz (95.1 kg)   SpO2 98%   BMI 39.60 kg/m     Wt Readings from Last 3 Encounters:  05/26/24 209 lb 9.6 oz (95.1 kg)  10/21/23 224 lb 9.6 oz (101.9 kg)  01/01/23 226 lb 12.8 oz (102.9 kg)     GEN: Well nourished, well developed in no acute distress HEENT: Normal NECK: No JVD; No carotid bruits LYMPHATICS:  No lymphadenopathy CARDIAC: S1S2 noted,RRR, no murmurs, rubs, gallops RESPIRATORY:  Clear to auscultation without rales, wheezing or rhonchi  ABDOMEN: Soft, non-tender, non-distended, +bowel sounds, no guarding. EXTREMITIES:+2 lower extremity edema, No cyanosis, no clubbing MUSCULOSKELETAL:  No deformity  SKIN: Warm and dry NEUROLOGIC:  Alert and oriented x 3, non-focal PSYCHIATRIC:  Normal affect, good insight  ASSESSMENT:    1. Medication management   2. Not taking medication as directed   3.  Uncontrolled hypertension   4. CKD (chronic kidney disease) stage 4, GFR 15-29 ml/min (HCC)   5. Obesity (BMI 30-39.9)    PLAN:    Hypertension Hypertension remains uncontrolled. She has been off medications since January, relying on faith for management. She acknowledges the chronic nature but is not ready to restart medications. - Discussed restarting medications one by one, including carvedilol , hydralazine , and valsartan , when she feels ready. - Discuss readiness to restart antihypertensive medications at future visits.  Edema Edema managed with Lasix , taken as needed, with minimal swelling reported. - Continue Lasix  as needed for edema management.  Chronic kidney disease, unspecified stage Chronic kidney disease is being monitored. - Order blood work to assess kidney function.  Gout No recent flare-ups reported.   Weight Management Patient reports weight loss from 237 lbs to 220 lbs through dietary changes and exercise. -Encourage continuation of healthy lifestyle changes. -Plan for follow-up in 4 months to monitor progress.  General Health Maintenance -Review of recent blood work from rheumatologist shows no concerning findings. -Plan for next appointment in new location in May 2025.  The patient is in agreement with the above plan. The patient left the office in stable condition.  The patient will follow up in 4 months or sooner if needed.   Medication Adjustments/Labs and Tests Ordered: Current medicines are reviewed at length with the patient today.  Concerns regarding medicines are outlined above.  Orders Placed This Encounter  Procedures   Comp Met (CMET)   Magnesium   No orders of the defined types were placed in this encounter.   Patient Instructions  Medication Instructions:  Your physician recommends that you continue on your current medications as directed. Please refer to the Current Medication list given to you today.  *If you need a refill on your  cardiac medications before your next appointment, please call your pharmacy*  Lab Work: CMET, Mag If you have labs (blood work) drawn today and your tests are completely normal, you will receive your results only by: MyChart Message (if you have MyChart) OR A paper copy in the mail If you have any lab test that is abnormal or we need to change your treatment, we will call you to review the results.   Follow-Up: At [facility], you and your health needs are our priority.  As part of our continuing mission to provide you with exceptional heart care, our providers are all part of one team.  This team includes your primary Cardiologist (physician) and Advanced Practice Providers or APPs (Physician Assistants and Nurse Practitioners) who all work together to provide you with the care you need, when you need it.  Your next appointment:   9 month(s)  Provider:   Dalinda Heidt, DO          Adopting a Healthy Lifestyle.  Know what a healthy weight is for you (roughly BMI <25) and aim to maintain this   Aim for 7+ servings of fruits and vegetables daily   65-80+ fluid ounces of water or unsweet  tea for healthy kidneys   Limit to max 1 drink of alcohol per day; avoid smoking/tobacco   Limit animal fats in diet for cholesterol and heart health - choose grass fed whenever available   Avoid highly processed foods, and foods high in saturated/trans fats   Aim for low stress - take time to unwind and care for your mental health   Aim for 150 min of moderate intensity exercise weekly for heart health, and weights twice weekly for bone health   Aim for 7-9 hours of sleep daily   When it comes to diets, agreement about the perfect plan isnt easy to find, even among the experts. Experts at the [facility] [employer] [employer] developed an idea known as the Healthy Eating Plate. Just imagine a plate divided into logical, healthy portions.   The emphasis is on diet quality:   Load up  on vegetables and fruits - one-half of your plate: Aim for color and variety, and remember that potatoes dont count.   Go for whole grains - one-quarter of your plate: Whole wheat, barley, wheat berries, quinoa, oats, brown rice, and foods made with them. If you want pasta, go with whole wheat pasta.   Protein power - one-quarter of your plate: Fish, chicken, beans, and nuts are all healthy, versatile protein sources. Limit red meat.   The diet, however, does go beyond the plate, offering a few other suggestions.   Use healthy plant oils, such as olive, canola, soy, corn, sunflower and peanut. Check the labels, and avoid partially hydrogenated oil, which have unhealthy trans fats.   If youre thirsty, drink water. Coffee and tea are good in moderation, but skip sugary drinks and limit milk and dairy products to one or two daily servings.   The type of carbohydrate in the diet is more important than the amount. Some sources of carbohydrates, such as vegetables, fruits, whole grains, and beans-are healthier than others.   Finally, stay active  Signed, Veto Macqueen, DO  05/30/2024 1:04 PM    [facility]

## 2024-06-01 ENCOUNTER — Ambulatory Visit: Payer: Self-pay | Admitting: Cardiology

## 2024-06-24 DIAGNOSIS — H40053 Ocular hypertension, bilateral: Secondary | ICD-10-CM | POA: Diagnosis not present

## 2024-06-24 DIAGNOSIS — H5213 Myopia, bilateral: Secondary | ICD-10-CM | POA: Diagnosis not present

## 2024-06-24 DIAGNOSIS — H524 Presbyopia: Secondary | ICD-10-CM | POA: Diagnosis not present

## 2024-06-24 DIAGNOSIS — H52223 Regular astigmatism, bilateral: Secondary | ICD-10-CM | POA: Diagnosis not present

## 2024-09-22 ENCOUNTER — Telehealth: Payer: Self-pay | Admitting: Medical

## 2024-09-22 NOTE — Telephone Encounter (Signed)
 Copied from CRM #[id number]. Topic: Medicare AWV >> Sep 22, 2024 10:46 AM Nathanel DEL wrote: Called LVM 09/22/2024 to sched AWVS. Please schedule AWVS in office.  Nathanel Paschal; Care Guide Ambulatory Clinical Support [facility] Direct Dial: [phone]
